# Patient Record
Sex: Male | Born: 1950 | Race: White | Hispanic: No | Marital: Married | Smoking: Never smoker
Health system: Southern US, Community
[De-identification: ages and names within clinical notes are randomized; demographics above are authoritative.]

## PROBLEM LIST (undated history)

## (undated) DIAGNOSIS — N400 Enlarged prostate without lower urinary tract symptoms: Secondary | ICD-10-CM

## (undated) DIAGNOSIS — G629 Polyneuropathy, unspecified: Secondary | ICD-10-CM

## (undated) DIAGNOSIS — M109 Gout, unspecified: Secondary | ICD-10-CM

## (undated) DIAGNOSIS — G5680 Other specified mononeuropathies of unspecified upper limb: Secondary | ICD-10-CM

## (undated) DIAGNOSIS — M1990 Unspecified osteoarthritis, unspecified site: Secondary | ICD-10-CM

## (undated) DIAGNOSIS — IMO0002 Reserved for concepts with insufficient information to code with codable children: Secondary | ICD-10-CM

## (undated) DIAGNOSIS — I509 Heart failure, unspecified: Secondary | ICD-10-CM

## (undated) DIAGNOSIS — E041 Nontoxic single thyroid nodule: Secondary | ICD-10-CM

## (undated) DIAGNOSIS — I10 Essential (primary) hypertension: Secondary | ICD-10-CM

## (undated) DIAGNOSIS — J189 Pneumonia, unspecified organism: Secondary | ICD-10-CM

## (undated) DIAGNOSIS — I4891 Unspecified atrial fibrillation: Secondary | ICD-10-CM

## (undated) DIAGNOSIS — I4892 Unspecified atrial flutter: Secondary | ICD-10-CM

## (undated) HISTORY — PX: IRRIGATION AND DEBRIDEMENT SEBACEOUS CYST: SHX5255

## (undated) HISTORY — DX: Reserved for concepts with insufficient information to code with codable children: IMO0002

## (undated) HISTORY — PX: PILONIDAL CYST DRAINAGE: SHX743

## (undated) HISTORY — DX: Unspecified atrial fibrillation: I48.91

## (undated) HISTORY — DX: Nontoxic single thyroid nodule: E04.1

## (undated) HISTORY — DX: Unspecified atrial flutter: I48.92

## (undated) HISTORY — DX: Heart failure, unspecified: I50.9

## (undated) HISTORY — PX: CATARACT EXTRACTION W/ INTRAOCULAR LENS  IMPLANT, BILATERAL: SHX1307

## (undated) HISTORY — PX: BIOPSY THYROID: PRO38

## (undated) HISTORY — DX: Benign prostatic hyperplasia without lower urinary tract symptoms: N40.0

## (undated) HISTORY — DX: Other specified mononeuropathies of unspecified upper limb: G56.80

## (undated) HISTORY — PX: BACK SURGERY: SHX140

## (undated) HISTORY — DX: Gout, unspecified: M10.9

---

## 2009-12-18 ENCOUNTER — Ambulatory Visit (HOSPITAL_COMMUNITY): Admission: RE | Admit: 2009-12-18 | Discharge: 2009-12-18 | Payer: Self-pay | Admitting: General Surgery

## 2010-05-25 HISTORY — PX: COLONOSCOPY: SHX174

## 2010-08-09 LAB — CBC
HCT: 38.6 % — ABNORMAL LOW (ref 39.0–52.0)
Hemoglobin: 13.3 g/dL (ref 13.0–17.0)
MCHC: 34.3 g/dL (ref 30.0–36.0)
RBC: 4.49 MIL/uL (ref 4.22–5.81)

## 2010-08-09 LAB — WOUND CULTURE: Culture: NO GROWTH

## 2010-08-09 LAB — BASIC METABOLIC PANEL
GFR calc Af Amer: >60 mL/min (ref >60)
GFR calc non Af Amer: >60 mL/min (ref >60)
Glucose, Bld: 110 mg/dL — ABNORMAL HIGH (ref 70–99)
Potassium: 3.5 mEq/L (ref 3.5–5.1)
Sodium: 139 mEq/L (ref 135–145)

## 2010-08-09 LAB — SURGICAL PCR SCREEN
MRSA, PCR: NEGATIVE
Staphylococcus aureus: NEGATIVE

## 2010-12-30 HISTORY — PX: COLONOSCOPY: SHX174

## 2011-09-21 ENCOUNTER — Other Ambulatory Visit: Payer: Self-pay | Admitting: Otolaryngology

## 2011-09-21 DIAGNOSIS — E041 Nontoxic single thyroid nodule: Secondary | ICD-10-CM

## 2011-09-29 ENCOUNTER — Ambulatory Visit
Admission: RE | Admit: 2011-09-29 | Discharge: 2011-09-29 | Disposition: A | Discharge status: Home / Self Care | Payer: PRIVATE HEALTH INSURANCE | Source: Ambulatory Visit | Attending: Otolaryngology | Admitting: Otolaryngology

## 2011-09-29 ENCOUNTER — Other Ambulatory Visit (HOSPITAL_COMMUNITY)
Admission: RE | Admit: 2011-09-29 | Discharge: 2011-09-29 | Disposition: A | Discharge status: Home / Self Care | Payer: PRIVATE HEALTH INSURANCE | Source: Ambulatory Visit | Attending: Interventional Radiology | Admitting: Interventional Radiology

## 2011-09-29 DIAGNOSIS — E049 Nontoxic goiter, unspecified: Secondary | ICD-10-CM | POA: Insufficient documentation

## 2011-09-29 DIAGNOSIS — E041 Nontoxic single thyroid nodule: Secondary | ICD-10-CM

## 2011-11-25 ENCOUNTER — Other Ambulatory Visit: Payer: Self-pay | Admitting: Orthopaedic Surgery

## 2011-11-25 DIAGNOSIS — M545 Low back pain: Secondary | ICD-10-CM

## 2011-11-25 DIAGNOSIS — M5137 Other intervertebral disc degeneration, lumbosacral region: Secondary | ICD-10-CM

## 2011-12-02 ENCOUNTER — Ambulatory Visit
Admission: RE | Admit: 2011-12-02 | Discharge: 2011-12-02 | Disposition: A | Discharge status: Home / Self Care | Payer: PRIVATE HEALTH INSURANCE | Source: Ambulatory Visit | Attending: Orthopaedic Surgery | Admitting: Orthopaedic Surgery

## 2011-12-02 DIAGNOSIS — M5137 Other intervertebral disc degeneration, lumbosacral region: Secondary | ICD-10-CM

## 2011-12-02 DIAGNOSIS — M545 Low back pain: Secondary | ICD-10-CM

## 2012-01-11 DIAGNOSIS — R002 Palpitations: Secondary | ICD-10-CM

## 2012-02-16 ENCOUNTER — Encounter: Payer: Self-pay | Admitting: Cardiology

## 2012-02-23 ENCOUNTER — Other Ambulatory Visit: Payer: Self-pay | Admitting: Cardiology

## 2012-02-23 ENCOUNTER — Telehealth: Payer: Self-pay

## 2012-02-23 ENCOUNTER — Ambulatory Visit (INDEPENDENT_AMBULATORY_CARE_PROVIDER_SITE_OTHER): Payer: PRIVATE HEALTH INSURANCE | Admitting: Cardiology

## 2012-02-23 ENCOUNTER — Encounter: Payer: Self-pay | Admitting: Cardiology

## 2012-02-23 ENCOUNTER — Encounter: Payer: Self-pay | Admitting: *Deleted

## 2012-02-23 VITALS — BP 158/87 | HR 60 | Ht 74.0 in | Wt 268.4 lb

## 2012-02-23 DIAGNOSIS — I429 Cardiomyopathy, unspecified: Secondary | ICD-10-CM

## 2012-02-23 DIAGNOSIS — R931 Abnormal findings on diagnostic imaging of heart and coronary circulation: Secondary | ICD-10-CM | POA: Insufficient documentation

## 2012-02-23 DIAGNOSIS — R9389 Abnormal findings on diagnostic imaging of other specified body structures: Secondary | ICD-10-CM

## 2012-02-23 DIAGNOSIS — I428 Other cardiomyopathies: Secondary | ICD-10-CM

## 2012-02-23 NOTE — Telephone Encounter (Signed)
No precert required per Ebbie Ridge @ [facility] [phone]

## 2012-02-23 NOTE — Progress Notes (Signed)
HPI The patient presents for evaluation of a cardiomyopathy. Earlier this year he was complaining of palpitations. He relates this to an attempt to stop his Bystolic in favor of another blood pressure medication. He was eventually restarted on this. However, he noticed palpitations during this transition period.  He describes skipping heartbeats. He did not describe presyncope or syncope with these. They would happen at rest and at night. He could not bring him on. He did wear a monitor which demonstrated occasional PVCs and PACs but no sustained dysrhythmia. He also had a workup which included an echocardiogram which suggested that his EF was mildly low at 45-50%. He has never had any chest pressure, neck or arm discomfort. He denies any shortness of breath, PND or orthopnea. He's had no edema. He has started exercising recently walking on a treadmill without limitations.   No Known Allergies  Current Outpatient Prescriptions  Medication Sig Dispense Refill  . allopurinol (ZYLOPRIM) 300 MG tablet Take 300 mg by mouth daily.      . Calcium Carb-Cholecalciferol (CALCIUM + D3 PO) Take 1 tablet by mouth daily.      . Cholecalciferol (VITAMIN D3) 2000 UNITS TABS Take 1 tablet by mouth daily.      [name] CINNAMON PO Take 350 mg by mouth daily.      . Coenzyme Q10 200 MG capsule Take 200 mg by mouth daily.      . Ferrous Fumarate (IRON) 18 MG TBCR Take 1 tablet by mouth daily.      . fish oil-omega-3 fatty acids 1000 MG capsule Take 830 mg by mouth daily.      . Glucosamine-Chondroitin (GLUCOSAMINE CHONDR COMPLEX PO) Take by mouth daily. 1500mg /1200mg       . Lutein 10 MG TABS Take 1 tablet by mouth daily.      . LUTEIN-ZEAXANTHIN PO Take 4 mg by mouth daily.      . Multiple Vitamins-Minerals (MULTIVITAMIN PO) Take 1 tablet by mouth daily.      . nebivolol (BYSTOLIC) 10 MG tablet Take one tab by mouth every morning & 1/2 tab every evening      . PHOSPHATIDYLCHOLINE PO Take 420 mg by mouth daily.      .  Probiotic Product (PROBIOTIC DAILY PO) Take by mouth. 5 billion unit daily      . RESVERATROL 100 MG CAPS Take 1 capsule by mouth daily.      . valsartan (DIOVAN) 320 MG tablet Take 320 mg by mouth daily.        Past Medical History  Diagnosis Date  . HTN (hypertension)   . Gout   . Chronic back pain     Spinal stenosis  . Thyroid nodule     Benign    Past Surgical History  Procedure Date  . Pilonidal cyst drainage   . Cataract extraction     Family History  Problem Relation Age of Onset  . Hypertension    . Cancer Father     Lung  . Atrial fibrillation Mother   . Cancer Brother     Liver    History   Social History  . Marital Status: Married    Spouse Name: N/A    Number of Children: 3  . Years of Education: N/A   Occupational History  . Not on file.   Social History Main Topics  . Smoking status: Never Smoker   . Smokeless tobacco: Not on file  . Alcohol Use: No  . Drug Use:  No  . Sexually Active: Not on file   Other Topics Concern  . Not on file   Social History Narrative  . No narrative on file    ROS:  As stated in the HPI and negative for all other systems.  PHYSICAL EXAM BP 158/87  Pulse 60  Ht 6\' 2"  (1.88 m)  Wt 121.745 kg (268 lb 6.4 oz)  BMI 34.46 kg/m2  SpO2 99% GENERAL:  Well appearing HEENT:  Pupils equal round and reactive, fundi not visualized, oral mucosa unremarkable NECK:  No jugular venous distention, waveform within normal limits, carotid upstroke brisk and symmetric, no bruits, no thyromegaly LYMPHATICS:  No cervical, inguinal adenopathy LUNGS:  Clear to auscultation bilaterally BACK:  No CVA tenderness CHEST:  Unremarkable HEART:  PMI not displaced or sustained,S1 and S2 within normal limits, no S3, no S4, no clicks, no rubs, no murmurs ABD:  Flat, positive bowel sounds normal in frequency in pitch, no bruits, no rebound, no guarding, no midline pulsatile mass, no hepatomegaly, no splenomegaly EXT:  2 plus pulses  throughout, no edema, no cyanosis no clubbing SKIN:  No rashes no nodules NEURO:  Cranial nerves II through XII grossly intact, motor grossly intact throughout PSYCH:  Cognitively intact, oriented to person place and time   EKG:  Sinus bradycardia, rate 53, axis within normal limits, nonspecific inferior T wave changes.  01/11/12  ASSESSMENT AND PLAN  Cardiomyopathy  - I do not strongly suspect an ischemic etiology. However, given the reduced ejection fraction this needs to be ruled out. I will schedule an exercise perfusion study. This can also reassess the ejection fraction.   Obesity - I congratulated him on a 40 pound weight loss over the past many months. We discussed exercise as well as diet continue this.  Hypertension - His blood pressure is elevated today. However, this is unusual. No change in therapy is indicated at this point.

## 2012-02-23 NOTE — Telephone Encounter (Signed)
Exercise Myoview holding bystolic  UV:[id number] 425.4 Weight 268   Thursday, February 25, 2012 [facility]

## 2012-02-23 NOTE — Patient Instructions (Addendum)
Your physician recommends that you schedule a follow-up appointment based on your stress test results. We will call you with the result. Your physician recommends that you continue on your current medications as directed. Please refer to the Current Medication list given to you today.  Your physician has requested that you have en exercise stress myoview. For further information please visit [email or web address] Please follow instruction sheet, as given.

## 2012-02-25 DIAGNOSIS — R002 Palpitations: Secondary | ICD-10-CM

## 2012-03-01 ENCOUNTER — Telehealth: Payer: Self-pay | Admitting: *Deleted

## 2012-03-01 NOTE — Telephone Encounter (Signed)
Message copied by Eustace Moore on Tue Mar 01, 2012  4:23 PM ------      Message from: Rollene Rotunda      Created: Sun Feb 28, 2012  4:57 PM       EF was low normal. Submaximal test but no evidence of ischemia.  Call Mr. Provencio with the results and send results to VYAS,DHRUV B., MD

## 2012-03-01 NOTE — Telephone Encounter (Signed)
Patient informed and copy sent to PCP. 

## 2012-03-03 ENCOUNTER — Ambulatory Visit: Payer: PRIVATE HEALTH INSURANCE | Admitting: Cardiology

## 2012-05-11 ENCOUNTER — Other Ambulatory Visit: Payer: Self-pay | Admitting: Otolaryngology

## 2012-05-11 DIAGNOSIS — D34 Benign neoplasm of thyroid gland: Secondary | ICD-10-CM

## 2012-05-27 ENCOUNTER — Ambulatory Visit
Admission: RE | Admit: 2012-05-27 | Discharge: 2012-05-27 | Disposition: A | Discharge status: Home / Self Care | Payer: PRIVATE HEALTH INSURANCE | Source: Ambulatory Visit | Attending: Otolaryngology | Admitting: Otolaryngology

## 2012-05-27 DIAGNOSIS — D34 Benign neoplasm of thyroid gland: Secondary | ICD-10-CM

## 2012-10-28 ENCOUNTER — Other Ambulatory Visit: Payer: Self-pay | Admitting: Otolaryngology

## 2012-10-28 DIAGNOSIS — D34 Benign neoplasm of thyroid gland: Secondary | ICD-10-CM

## 2012-10-31 ENCOUNTER — Ambulatory Visit
Admission: RE | Admit: 2012-10-31 | Discharge: 2012-10-31 | Disposition: A | Discharge status: Home / Self Care | Payer: PRIVATE HEALTH INSURANCE | Source: Ambulatory Visit | Attending: Otolaryngology | Admitting: Otolaryngology

## 2012-10-31 DIAGNOSIS — D34 Benign neoplasm of thyroid gland: Secondary | ICD-10-CM

## 2013-01-10 ENCOUNTER — Other Ambulatory Visit: Payer: Self-pay | Admitting: Orthopaedic Surgery

## 2013-01-10 DIAGNOSIS — M545 Low back pain: Secondary | ICD-10-CM

## 2013-01-14 ENCOUNTER — Ambulatory Visit
Admission: RE | Admit: 2013-01-14 | Discharge: 2013-01-14 | Disposition: A | Discharge status: Home / Self Care | Payer: PRIVATE HEALTH INSURANCE | Source: Ambulatory Visit | Attending: Orthopaedic Surgery | Admitting: Orthopaedic Surgery

## 2013-01-14 DIAGNOSIS — M545 Low back pain: Secondary | ICD-10-CM

## 2013-01-17 ENCOUNTER — Encounter (HOSPITAL_COMMUNITY): Payer: Self-pay | Admitting: Pharmacy Technician

## 2013-01-17 NOTE — H&P (Signed)
[name] is an 62 y.o. male.   Chief Complaint: back and leg pain. HPI:   He has had lumbar stenosis at the L4-5 level.  Pain has gotten severe in the last few weeks.  The last 5 days, he has not been able to walk.  He is using a walker that he borrowed.  He was only able to make it 50-75 feet.  He has had claudication, sometimes a little worse on the left than right, sometimes worse on the right.  He gets relief when he sits, relief in the supine position.  His distance is reproducible.  He gets some relief if he is in forward flexed position.  He has normally been followed by Dr. Sherril Croon, [facility].   He has been treated with prednisone pack.  He has had multiple epidural steroid injections in the last year.     An MRI scan from 12/02/2011 showed moderately severe stenosis, multifactorial, at the L4-5 level with annular bulge, right paracentral, left extra-foraminal disk protrusion, and moderate facet and ligamentous hypertrophy with some facet leakage of fluid, possibly small cyst subligamentous on the left side.  Mild changes at the level above or below.  No areas of compression other than the 4-5 level.    Past Medical History  Diagnosis Date  . HTN (hypertension)   . Gout   . Chronic back pain     Spinal stenosis  . Thyroid nodule     Benign  . Atrial fib/flutter, transient   . CAD (coronary artery disease)     Stent x 2  . SBO (small bowel obstruction)   . BPH (benign prostatic hyperplasia)   . COPD (chronic obstructive pulmonary disease)   . Pulmonary emboli   . Lung nodule     Past Surgical History  Procedure Laterality Date  . Pilonidal cyst drainage    . Cataract extraction      Family History  Problem Relation Age of Onset  . Hypertension    . Cancer Father     Lung  . Atrial fibrillation Mother   . Cancer Brother     Liver   Social History:  reports that he has never smoked. He does not have any smokeless tobacco history on file. He reports that  he does not drink alcohol or use illicit drugs.  Allergies: No Known Allergies  No prescriptions prior to admission    No results found for this or any previous visit (from the past 48 hour(s)). No results found.  Review of Systems  Musculoskeletal: Positive for back pain.  Neurological: Positive for tingling and focal weakness.       Leg weakness and symptoms of neurogenic claudication with ambulation  All other systems reviewed and are negative.    There were no vitals taken for this visit. Physical Exam  Constitutional: He is oriented to person, place, and time. He appears well-developed and well-nourished.  HENT:  Head: Normocephalic and atraumatic.  Eyes: EOM are normal. Pupils are equal, round, and reactive to light.  Neck: Normal range of motion. Neck supple.  Cardiovascular: Normal rate.   Respiratory: Effort normal.  GI: Soft.  Musculoskeletal:  Negative SLR bilateral.  No focal weakness of LEs  Neurological: He is alert and oriented to person, place, and time.  Skin: Skin is warm and dry.  Psychiatric: He has a normal mood and affect.     Assessment/Plan Spinal stenosis L4-5  PLAN:  L4-5 central decompressive laminectomy  [name]  01/17/2013, 11:49 AM

## 2013-01-18 ENCOUNTER — Other Ambulatory Visit (HOSPITAL_COMMUNITY): Payer: Self-pay | Admitting: Orthopaedic Surgery

## 2013-01-19 ENCOUNTER — Encounter (HOSPITAL_COMMUNITY)
Admission: RE | Admit: 2013-01-19 | Discharge: 2013-01-19 | Disposition: A | Discharge status: Home / Self Care | Payer: PRIVATE HEALTH INSURANCE | Source: Ambulatory Visit | Attending: Orthopaedic Surgery | Admitting: Orthopaedic Surgery

## 2013-01-19 ENCOUNTER — Encounter (HOSPITAL_COMMUNITY): Payer: Self-pay

## 2013-01-19 HISTORY — DX: Unspecified osteoarthritis, unspecified site: M19.90

## 2013-01-19 LAB — COMPREHENSIVE METABOLIC PANEL
Albumin: 3.7 g/dL (ref 3.5–5.2)
BUN: 17 mg/dL (ref 6–23)
Chloride: 106 mEq/L (ref 96–112)
Creatinine, Ser: 0.92 mg/dL (ref 0.50–1.35)
GFR calc non Af Amer: 88 mL/min — ABNORMAL LOW (ref >90)
Total Bilirubin: 1.1 mg/dL (ref 0.3–1.2)

## 2013-01-19 LAB — CBC
HCT: 42.8 % (ref 39.0–52.0)
MCV: 88.6 fL (ref 78.0–100.0)
RDW: 13.5 % (ref 11.5–15.5)
WBC: 8.3 10*3/uL (ref 4.0–10.5)

## 2013-01-19 LAB — PROTIME-INR
INR: 1.13 (ref 0.00–1.49)
Prothrombin Time: 14.3 seconds (ref 11.6–15.2)

## 2013-01-19 MED ORDER — DEXTROSE 5 % IV SOLN
3.0000 g | INTRAVENOUS | Status: AC
  Administered 2013-01-20: 3 g via INTRAVENOUS
  Filled 2013-01-19 (×2): qty 3000

## 2013-01-19 NOTE — Pre-Procedure Instructions (Signed)
[name]  01/19/2013    Your procedure is scheduled on:  January 20, 2013 at 11:24 AM  Report to [facility] at 9:30 AM.  Call this number if you have problems the morning of surgery: [phone]   Remember:   Do not eat food or drink liquids after midnight.   Take these medicines the morning of surgery with A SIP OF WATER: amLODipine (NORVASC), nebivolol (BYSTOLIC)     Do not wear jewelry, make-up or nail polish.  Do not wear lotions, powders, or perfumes. You may wear deodorant.  Do not shave 48 hours prior to surgery. Men may shave face and neck.  Do not bring valuables to the hospital.  [facility] is not responsible                   for any belongings or valuables.  Contacts, dentures or bridgework may not be worn into surgery.  Leave suitcase in the car. After surgery it may be brought to your room.  For patients admitted to the hospital, checkout time is 11:00 AM the day of  discharge.    Special Instructions: Shower using CHG 2 nights before surgery and the night before surgery.  If you shower the day of surgery use CHG.  Use special wash - you have one bottle of CHG for all showers.  You should use approximately 1/3 of the bottle for each shower.   Please read over the following fact sheets that you were given: Pain Booklet, Coughing and Deep Breathing, MRSA Information and Surgical Site Infection Prevention

## 2013-01-19 NOTE — Progress Notes (Signed)
01/19/13 0951  OBSTRUCTIVE SLEEP APNEA  Have you ever been diagnosed with sleep apnea through a sleep study? No  Do you snore loudly (loud enough to be heard through closed doors)?  0  Do you often feel tired, fatigued, or sleepy during the daytime? 0  Has anyone observed you stop breathing during your sleep? 0  Do you have, or are you being treated for high blood pressure? 1  BMI more than 35 kg/m2? 0  Age over 64 years old? 1  Neck circumference greater than 40 cm/18 inches? 1  Gender: 1  Obstructive Sleep Apnea Score 4  Score 4 or greater  Results sent to PCP

## 2013-01-20 ENCOUNTER — Encounter (HOSPITAL_COMMUNITY): Payer: Self-pay | Admitting: Anesthesiology

## 2013-01-20 ENCOUNTER — Encounter (HOSPITAL_COMMUNITY): Payer: Self-pay | Admitting: *Deleted

## 2013-01-20 ENCOUNTER — Encounter (HOSPITAL_COMMUNITY)
Admission: RE | Disposition: A | Discharge status: Home / Self Care | Payer: Self-pay | Source: Ambulatory Visit | Attending: Orthopaedic Surgery

## 2013-01-20 ENCOUNTER — Ambulatory Visit (HOSPITAL_COMMUNITY): Payer: PRIVATE HEALTH INSURANCE

## 2013-01-20 ENCOUNTER — Ambulatory Visit (HOSPITAL_COMMUNITY): Payer: PRIVATE HEALTH INSURANCE | Admitting: Anesthesiology

## 2013-01-20 ENCOUNTER — Observation Stay (HOSPITAL_COMMUNITY)
Admission: RE | Admit: 2013-01-20 | Discharge: 2013-01-21 | Disposition: A | Discharge status: Home / Self Care | Payer: PRIVATE HEALTH INSURANCE | Source: Ambulatory Visit | Attending: Orthopaedic Surgery | Admitting: Orthopaedic Surgery

## 2013-01-20 DIAGNOSIS — M5126 Other intervertebral disc displacement, lumbar region: Principal | ICD-10-CM | POA: Diagnosis present

## 2013-01-20 DIAGNOSIS — N400 Enlarged prostate without lower urinary tract symptoms: Secondary | ICD-10-CM | POA: Insufficient documentation

## 2013-01-20 DIAGNOSIS — Z01818 Encounter for other preprocedural examination: Secondary | ICD-10-CM | POA: Insufficient documentation

## 2013-01-20 DIAGNOSIS — R911 Solitary pulmonary nodule: Secondary | ICD-10-CM | POA: Insufficient documentation

## 2013-01-20 DIAGNOSIS — Z86711 Personal history of pulmonary embolism: Secondary | ICD-10-CM | POA: Insufficient documentation

## 2013-01-20 DIAGNOSIS — Z01812 Encounter for preprocedural laboratory examination: Secondary | ICD-10-CM | POA: Insufficient documentation

## 2013-01-20 DIAGNOSIS — J449 Chronic obstructive pulmonary disease, unspecified: Secondary | ICD-10-CM | POA: Insufficient documentation

## 2013-01-20 DIAGNOSIS — I10 Essential (primary) hypertension: Secondary | ICD-10-CM | POA: Insufficient documentation

## 2013-01-20 DIAGNOSIS — I2510 Atherosclerotic heart disease of native coronary artery without angina pectoris: Secondary | ICD-10-CM | POA: Insufficient documentation

## 2013-01-20 DIAGNOSIS — E041 Nontoxic single thyroid nodule: Secondary | ICD-10-CM | POA: Insufficient documentation

## 2013-01-20 DIAGNOSIS — J4489 Other specified chronic obstructive pulmonary disease: Secondary | ICD-10-CM | POA: Insufficient documentation

## 2013-01-20 DIAGNOSIS — G8929 Other chronic pain: Secondary | ICD-10-CM | POA: Insufficient documentation

## 2013-01-20 DIAGNOSIS — M109 Gout, unspecified: Secondary | ICD-10-CM | POA: Insufficient documentation

## 2013-01-20 DIAGNOSIS — Z9861 Coronary angioplasty status: Secondary | ICD-10-CM | POA: Insufficient documentation

## 2013-01-20 HISTORY — PX: LUMBAR LAMINECTOMY: SHX95

## 2013-01-20 SURGERY — MICRODISCECTOMY LUMBAR LAMINECTOMY
Anesthesia: General | Site: Back | Wound class: Clean

## 2013-01-20 MED ORDER — SODIUM CHLORIDE 0.9 % IJ SOLN
3.0000 mL | Freq: Two times a day (BID) | INTRAMUSCULAR | Status: DC
  Administered 2013-01-20 – 2013-01-21 (×2): 3 mL via INTRAVENOUS

## 2013-01-20 MED ORDER — ROCURONIUM BROMIDE 100 MG/10ML IV SOLN
INTRAVENOUS | Status: DC | PRN
  Administered 2013-01-20: 10 mg via INTRAVENOUS
  Administered 2013-01-20: 50 mg via INTRAVENOUS

## 2013-01-20 MED ORDER — OXYCODONE HCL 5 MG PO TABS
ORAL_TABLET | ORAL | Status: AC
  Filled 2013-01-20: qty 1

## 2013-01-20 MED ORDER — FENTANYL CITRATE 0.05 MG/ML IJ SOLN
INTRAMUSCULAR | Status: DC | PRN
  Administered 2013-01-20: 50 ug via INTRAVENOUS
  Administered 2013-01-20 (×2): 100 ug via INTRAVENOUS

## 2013-01-20 MED ORDER — METHOCARBAMOL 100 MG/ML IJ SOLN
500.0000 mg | Freq: Four times a day (QID) | INTRAVENOUS | Status: DC | PRN
  Filled 2013-01-20: qty 5

## 2013-01-20 MED ORDER — SODIUM CHLORIDE 0.9 % IV SOLN: 250.0000 mL | INTRAVENOUS | Status: DC

## 2013-01-20 MED ORDER — MORPHINE SULFATE 2 MG/ML IJ SOLN: 1.0000 mg | INTRAMUSCULAR | Status: DC | PRN

## 2013-01-20 MED ORDER — FLEET ENEMA 7-19 GM/118ML RE ENEM: 1.0000 | ENEMA | Freq: Once | RECTAL | Status: AC | PRN

## 2013-01-20 MED ORDER — ZOLPIDEM TARTRATE 5 MG PO TABS: 5.0000 mg | ORAL_TABLET | Freq: Every evening | ORAL | Status: DC | PRN

## 2013-01-20 MED ORDER — OXYCODONE HCL 5 MG/5ML PO SOLN: 5.0000 mg | Freq: Once | ORAL | Status: AC | PRN

## 2013-01-20 MED ORDER — PROMETHAZINE HCL 25 MG/ML IJ SOLN: 6.2500 mg | INTRAMUSCULAR | Status: DC | PRN

## 2013-01-20 MED ORDER — OXYCODONE-ACETAMINOPHEN 5-325 MG PO TABS: 1.0000 | ORAL_TABLET | ORAL | Status: DC | PRN

## 2013-01-20 MED ORDER — DOCUSATE SODIUM 100 MG PO CAPS
100.0000 mg | ORAL_CAPSULE | Freq: Two times a day (BID) | ORAL | Status: DC
  Administered 2013-01-21: 100 mg via ORAL
  Filled 2013-01-20 (×2): qty 1

## 2013-01-20 MED ORDER — BISACODYL 10 MG RE SUPP: 10.0000 mg | Freq: Every day | RECTAL | Status: DC | PRN

## 2013-01-20 MED ORDER — HYDROMORPHONE HCL PF 1 MG/ML IJ SOLN
INTRAMUSCULAR | Status: AC
  Filled 2013-01-20: qty 1

## 2013-01-20 MED ORDER — THROMBIN 20000 UNITS EX SOLR
CUTANEOUS | Status: AC
  Filled 2013-01-20: qty 20000

## 2013-01-20 MED ORDER — ONDANSETRON HCL 4 MG/2ML IJ SOLN
INTRAMUSCULAR | Status: DC | PRN
  Administered 2013-01-20: 4 mg via INTRAVENOUS

## 2013-01-20 MED ORDER — NEBIVOLOL HCL 10 MG PO TABS
10.0000 mg | ORAL_TABLET | Freq: Two times a day (BID) | ORAL | Status: DC
  Administered 2013-01-21: 10 mg via ORAL
  Filled 2013-01-20 (×3): qty 1

## 2013-01-20 MED ORDER — SODIUM CHLORIDE 0.9 % IJ SOLN: 3.0000 mL | INTRAMUSCULAR | Status: DC | PRN

## 2013-01-20 MED ORDER — HYDROMORPHONE HCL PF 1 MG/ML IJ SOLN: 0.2500 mg | INTRAMUSCULAR | Status: DC | PRN

## 2013-01-20 MED ORDER — OXYCODONE HCL 5 MG PO TABS
5.0000 mg | ORAL_TABLET | Freq: Once | ORAL | Status: AC | PRN
  Administered 2013-01-20: 5 mg via ORAL

## 2013-01-20 MED ORDER — LACTATED RINGERS IV SOLN
INTRAVENOUS | Status: DC | PRN
  Administered 2013-01-20 (×2): via INTRAVENOUS

## 2013-01-20 MED ORDER — PHENYLEPHRINE HCL 10 MG/ML IJ SOLN
INTRAMUSCULAR | Status: DC | PRN
  Administered 2013-01-20: 40 ug via INTRAVENOUS

## 2013-01-20 MED ORDER — HYDROCODONE-ACETAMINOPHEN 5-325 MG PO TABS: 1.0000 | ORAL_TABLET | ORAL | Status: DC | PRN

## 2013-01-20 MED ORDER — CEFAZOLIN SODIUM 1-5 GM-% IV SOLN
1.0000 g | Freq: Three times a day (TID) | INTRAVENOUS | Status: AC
  Administered 2013-01-20 – 2013-01-21 (×2): 1 g via INTRAVENOUS
  Filled 2013-01-20 (×2): qty 50

## 2013-01-20 MED ORDER — MIDAZOLAM HCL 2 MG/2ML IJ SOLN: 1.0000 mg | INTRAMUSCULAR | Status: DC | PRN

## 2013-01-20 MED ORDER — OXYCODONE-ACETAMINOPHEN 5-325 MG PO TABS
1.0000 | ORAL_TABLET | ORAL | Status: DC | PRN
  Administered 2013-01-20: 2 via ORAL
  Filled 2013-01-20: qty 2

## 2013-01-20 MED ORDER — EPHEDRINE SULFATE 50 MG/ML IJ SOLN
INTRAMUSCULAR | Status: DC | PRN
  Administered 2013-01-20 (×2): 10 mg via INTRAVENOUS
  Administered 2013-01-20: 5 mg via INTRAVENOUS
  Administered 2013-01-20 (×2): 10 mg via INTRAVENOUS

## 2013-01-20 MED ORDER — LIDOCAINE HCL (CARDIAC) 20 MG/ML IV SOLN
INTRAVENOUS | Status: DC | PRN
  Administered 2013-01-20: 100 mg via INTRAVENOUS

## 2013-01-20 MED ORDER — NEOSTIGMINE METHYLSULFATE 1 MG/ML IJ SOLN
INTRAMUSCULAR | Status: DC | PRN
  Administered 2013-01-20: 4 mg via INTRAVENOUS

## 2013-01-20 MED ORDER — BUPIVACAINE HCL 0.5 % IJ SOLN
INTRAMUSCULAR | Status: DC | PRN
  Administered 2013-01-20: 10 mL

## 2013-01-20 MED ORDER — METHOCARBAMOL 500 MG PO TABS: 500.0000 mg | ORAL_TABLET | Freq: Four times a day (QID) | ORAL | Status: DC | PRN

## 2013-01-20 MED ORDER — ACETAMINOPHEN 325 MG PO TABS: 650.0000 mg | ORAL_TABLET | ORAL | Status: DC | PRN

## 2013-01-20 MED ORDER — MENTHOL 3 MG MT LOZG: 1.0000 | LOZENGE | OROMUCOSAL | Status: DC | PRN

## 2013-01-20 MED ORDER — METHOCARBAMOL 500 MG PO TABS
ORAL_TABLET | ORAL | Status: AC
  Administered 2013-01-20: 500 mg
  Filled 2013-01-20: qty 1

## 2013-01-20 MED ORDER — GLYCOPYRROLATE 0.2 MG/ML IJ SOLN
INTRAMUSCULAR | Status: DC | PRN
  Administered 2013-01-20: 0.6 mg via INTRAVENOUS

## 2013-01-20 MED ORDER — PANTOPRAZOLE SODIUM 40 MG IV SOLR
40.0000 mg | Freq: Every day | INTRAVENOUS | Status: DC
  Administered 2013-01-20: 40 mg via INTRAVENOUS
  Filled 2013-01-20 (×2): qty 40

## 2013-01-20 MED ORDER — PHENOL 1.4 % MT LIQD: 1.0000 | OROMUCOSAL | Status: DC | PRN

## 2013-01-20 MED ORDER — IRBESARTAN 300 MG PO TABS
300.0000 mg | ORAL_TABLET | Freq: Every day | ORAL | Status: DC
  Administered 2013-01-21: 300 mg via ORAL
  Filled 2013-01-20: qty 1

## 2013-01-20 MED ORDER — ACETAMINOPHEN 650 MG RE SUPP: 650.0000 mg | RECTAL | Status: DC | PRN

## 2013-01-20 MED ORDER — BUPIVACAINE HCL (PF) 0.5 % IJ SOLN
INTRAMUSCULAR | Status: AC
  Filled 2013-01-20: qty 30

## 2013-01-20 MED ORDER — FENTANYL CITRATE 0.05 MG/ML IJ SOLN: 50.0000 ug | Freq: Once | INTRAMUSCULAR | Status: DC

## 2013-01-20 MED ORDER — METHOCARBAMOL 500 MG PO TABS
500.0000 mg | ORAL_TABLET | Freq: Four times a day (QID) | ORAL | Status: DC | PRN
  Administered 2013-01-20: 500 mg via ORAL
  Filled 2013-01-20: qty 1

## 2013-01-20 MED ORDER — MIDAZOLAM HCL 5 MG/5ML IJ SOLN
INTRAMUSCULAR | Status: DC | PRN
  Administered 2013-01-20: 2 mg via INTRAVENOUS

## 2013-01-20 MED ORDER — 0.9 % SODIUM CHLORIDE (POUR BTL) OPTIME
TOPICAL | Status: DC | PRN
  Administered 2013-01-20: 1000 mL

## 2013-01-20 MED ORDER — ALLOPURINOL 300 MG PO TABS
300.0000 mg | ORAL_TABLET | Freq: Every day | ORAL | Status: DC
  Administered 2013-01-21: 300 mg via ORAL
  Filled 2013-01-20: qty 1

## 2013-01-20 MED ORDER — ARTIFICIAL TEARS OP OINT
TOPICAL_OINTMENT | OPHTHALMIC | Status: DC | PRN
  Administered 2013-01-20: 1 via OPHTHALMIC

## 2013-01-20 MED ORDER — SENNOSIDES-DOCUSATE SODIUM 8.6-50 MG PO TABS: 1.0000 | ORAL_TABLET | Freq: Every evening | ORAL | Status: DC | PRN

## 2013-01-20 MED ORDER — KETOROLAC TROMETHAMINE 30 MG/ML IJ SOLN
30.0000 mg | Freq: Four times a day (QID) | INTRAMUSCULAR | Status: DC
  Administered 2013-01-20 – 2013-01-21 (×3): 30 mg via INTRAVENOUS
  Filled 2013-01-20 (×3): qty 1

## 2013-01-20 MED ORDER — ONDANSETRON HCL 4 MG/2ML IJ SOLN: 4.0000 mg | INTRAMUSCULAR | Status: DC | PRN

## 2013-01-20 MED ORDER — AMLODIPINE BESYLATE 5 MG PO TABS
5.0000 mg | ORAL_TABLET | Freq: Every day | ORAL | Status: DC
  Administered 2013-01-21: 5 mg via ORAL
  Filled 2013-01-20: qty 1

## 2013-01-20 MED ORDER — KCL IN DEXTROSE-NACL 20-5-0.45 MEQ/L-%-% IV SOLN
INTRAVENOUS | Status: DC
  Administered 2013-01-20: 17:00:00 via INTRAVENOUS
  Filled 2013-01-20 (×3): qty 1000

## 2013-01-20 MED ORDER — PROPOFOL 10 MG/ML IV BOLUS
INTRAVENOUS | Status: DC | PRN
  Administered 2013-01-20: 200 mg via INTRAVENOUS
  Administered 2013-01-20: 50 mg via INTRAVENOUS

## 2013-01-20 MED ORDER — LACTATED RINGERS IV SOLN
INTRAVENOUS | Status: DC
  Administered 2013-01-20: 10:00:00 via INTRAVENOUS

## 2013-01-20 SURGICAL SUPPLY — 52 items
BUR ROUND FLUTED 4 SOFT TCH (BURR) ×2 IMPLANT
CLOTH BEACON ORANGE TIMEOUT ST (SAFETY) ×2 IMPLANT
CORDS BIPOLAR (ELECTRODE) ×2 IMPLANT
COVER SURGICAL LIGHT HANDLE (MISCELLANEOUS) ×2 IMPLANT
DERMABOND ADVANCED (GAUZE/BANDAGES/DRESSINGS) ×1
DERMABOND ADVANCED .7 DNX12 (GAUZE/BANDAGES/DRESSINGS) ×1 IMPLANT
DRAPE MICROSCOPE LEICA (MISCELLANEOUS) ×2 IMPLANT
DRAPE PROXIMA HALF (DRAPES) ×4 IMPLANT
DRSG EMULSION OIL 3X3 NADH (GAUZE/BANDAGES/DRESSINGS) ×2 IMPLANT
DRSG MEPILEX BORDER 4X4 (GAUZE/BANDAGES/DRESSINGS) ×2 IMPLANT
DRSG MEPILEX BORDER 4X8 (GAUZE/BANDAGES/DRESSINGS) ×2 IMPLANT
DURAPREP 26ML APPLICATOR (WOUND CARE) ×2 IMPLANT
ELECT BLADE 4.0 EZ CLEAN MEGAD (MISCELLANEOUS) ×2
ELECT REM PT RETURN 9FT ADLT (ELECTROSURGICAL) ×2
ELECTRODE BLDE 4.0 EZ CLN MEGD (MISCELLANEOUS) ×1 IMPLANT
ELECTRODE REM PT RTRN 9FT ADLT (ELECTROSURGICAL) ×1 IMPLANT
GLOVE BIOGEL PI IND STRL 7.5 (GLOVE) ×1 IMPLANT
GLOVE BIOGEL PI IND STRL 8 (GLOVE) ×1 IMPLANT
GLOVE BIOGEL PI INDICATOR 7.5 (GLOVE) ×1
GLOVE BIOGEL PI INDICATOR 8 (GLOVE) ×1
GLOVE ECLIPSE 7.0 STRL STRAW (GLOVE) ×2 IMPLANT
GLOVE ORTHO TXT STRL SZ7.5 (GLOVE) ×2 IMPLANT
GOWN PREVENTION PLUS LG XLONG (DISPOSABLE) ×4 IMPLANT
GOWN STRL NON-REIN LRG LVL3 (GOWN DISPOSABLE) ×2 IMPLANT
KIT BASIN OR (CUSTOM PROCEDURE TRAY) ×2 IMPLANT
KIT ROOM TURNOVER OR (KITS) ×2 IMPLANT
MANIFOLD NEPTUNE II (INSTRUMENTS) IMPLANT
NDL SUT .5 MAYO 1.404X.05X (NEEDLE) IMPLANT
NEEDLE 22X1 1/2 (OR ONLY) (NEEDLE) ×2 IMPLANT
NEEDLE MAYO TAPER (NEEDLE)
NEEDLE SPNL 18GX3.5 QUINCKE PK (NEEDLE) ×2 IMPLANT
NS IRRIG 1000ML POUR BTL (IV SOLUTION) ×2 IMPLANT
PACK LAMINECTOMY ORTHO (CUSTOM PROCEDURE TRAY) ×2 IMPLANT
PAD ARMBOARD 7.5X6 YLW CONV (MISCELLANEOUS) ×4 IMPLANT
PATTIES SURGICAL .5 X.5 (GAUZE/BANDAGES/DRESSINGS) ×2 IMPLANT
PATTIES SURGICAL .75X.75 (GAUZE/BANDAGES/DRESSINGS) ×2 IMPLANT
SPONGE GAUZE 4X4 12PLY (GAUZE/BANDAGES/DRESSINGS) ×2 IMPLANT
SUT VIC AB 0 CT1 27 (SUTURE) ×1
SUT VIC AB 0 CT1 27XBRD ANBCTR (SUTURE) ×1 IMPLANT
SUT VIC AB 1 CT1 27 (SUTURE) ×1
SUT VIC AB 1 CT1 27XBRD ANBCTR (SUTURE) ×1 IMPLANT
SUT VIC AB 2-0 CT1 27 (SUTURE) ×1
SUT VIC AB 2-0 CT1 TAPERPNT 27 (SUTURE) ×1 IMPLANT
SUT VICRYL 0 TIES 12 18 (SUTURE) ×2 IMPLANT
SUT VICRYL 4-0 PS2 18IN ABS (SUTURE) IMPLANT
SUT VICRYL AB 2 0 TIES (SUTURE) ×2 IMPLANT
SYR 20ML ECCENTRIC (SYRINGE) IMPLANT
SYR CONTROL 10ML LL (SYRINGE) ×2 IMPLANT
TOWEL OR 17X24 6PK STRL BLUE (TOWEL DISPOSABLE) ×2 IMPLANT
TOWEL OR 17X26 10 PK STRL BLUE (TOWEL DISPOSABLE) ×2 IMPLANT
WATER STERILE IRR 1000ML POUR (IV SOLUTION) IMPLANT
YANKAUER SUCT BULB TIP NO VENT (SUCTIONS) ×2 IMPLANT

## 2013-01-20 NOTE — Anesthesia Postprocedure Evaluation (Signed)
  Anesthesia Post-op Note  Patient: [name]  Procedure(s) Performed: Procedure(s) with comments: MICRODISCECTOMY LUMBAR LAMINECTOMY (N/A) - L4-5 Decompression  Patient Location: PACU  Anesthesia Type:General  Level of Consciousness: awake and alert   Airway and Oxygen Therapy: Patient Spontanous Breathing  Post-op Pain: mild  Post-op Assessment: Post-op Vital signs reviewed, Patient's Cardiovascular Status Stable, Respiratory Function Stable, Patent Airway, No signs of Nausea or vomiting and Pain level controlled  Post-op Vital Signs: stable  Complications: No apparent anesthesia complications

## 2013-01-20 NOTE — Interval H&P Note (Signed)
History and Physical Interval Note:  01/20/2013 11:36 AM  [name]  has presented today for surgery, with the diagnosis of L4-5 Stenosis  The various methods of treatment have been discussed with the patient and family. After consideration of risks, benefits and other options for treatment, the patient has consented to  Procedure(s) with comments: MICRODISCECTOMY LUMBAR LAMINECTOMY (N/A) - L4-5 Decompression as a surgical intervention .  The patient's history has been reviewed, patient examined, no change in status, stable for surgery.  I have reviewed the patient's chart and labs.  Questions were answered to the patient's satisfaction.     Sohail Capraro C

## 2013-01-20 NOTE — Brief Op Note (Cosign Needed)
01/20/2013  2:04 PM  PATIENT:  [name]  62 y.o. male  PRE-OPERATIVE DIAGNOSIS:  L4-5 Stenosis  POST-OPERATIVE DIAGNOSIS:  L4-5 Stenosis  PROCEDURE:  Procedure(s) with comments: MICRODISCECTOMY LUMBAR LAMINECTOMY (N/A) - L4-5 Decompression  SURGEON:  Surgeon(s) and Role:    * Eldred Manges, MD - Primary  PHYSICIAN ASSISTANT: Maud Deed PAC  ASSISTANTS: none   ANESTHESIA:   general  EBL:  Total I/O In: 1500 [I.V.:1500] Out: -   BLOOD ADMINISTERED:none  DRAINS: none   LOCAL MEDICATIONS USED:  MARCAINE     SPECIMEN:  No Specimen  DISPOSITION OF SPECIMEN:  N/A  COUNTS:  YES  TOURNIQUET:  * No tourniquets in log *  DICTATION: .Note written in EPIC  PLAN OF CARE: Admit for overnight observation  PATIENT DISPOSITION:  PACU - hemodynamically stable.   Delay start of Pharmacological VTE agent (>24hrs) due to surgical blood loss or risk of bleeding: yes

## 2013-01-20 NOTE — Anesthesia Preprocedure Evaluation (Addendum)
Anesthesia Evaluation  Patient identified by MRN, date of birth, ID band Patient awake    Reviewed: Allergy & Precautions, H&P , NPO status , Patient's Chart, lab work & pertinent test results  Airway Mallampati: II TM Distance: >3 FB Neck ROM: Full    Dental  (+) Teeth Intact, Caps and Dental Advisory Given   Pulmonary  breath sounds clear to auscultation        Cardiovascular hypertension, + CAD + dysrhythmias Atrial Fibrillation Rhythm:Regular Rate:Normal     Neuro/Psych  Neuromuscular disease    GI/Hepatic   Endo/Other    Renal/GU      Musculoskeletal   Abdominal (+) + obese,   Peds  Hematology   Anesthesia Other Findings   Reproductive/Obstetrics                          Anesthesia Physical Anesthesia Plan  ASA: III  Anesthesia Plan: General   Post-op Pain Management:    Induction: Intravenous  Airway Management Planned: Oral ETT  Additional Equipment:   Intra-op Plan:   Post-operative Plan: Extubation in OR  Informed Consent: I have reviewed the patients History and Physical, chart, labs and discussed the procedure including the risks, benefits and alternatives for the proposed anesthesia with the patient or authorized representative who has indicated his/her understanding and acceptance.     Plan Discussed with: CRNA and Surgeon  Anesthesia Plan Comments:         Anesthesia Quick Evaluation

## 2013-01-20 NOTE — Transfer of Care (Signed)
Immediate Anesthesia Transfer of Care Note  Patient: [name]  Procedure(s) Performed: Procedure(s) with comments: MICRODISCECTOMY LUMBAR LAMINECTOMY (N/A) - L4-5 Decompression  Patient Location: PACU  Anesthesia Type:General  Level of Consciousness: awake, alert  and oriented  Airway & Oxygen Therapy: Patient Spontanous Breathing and Patient connected to face mask oxygen  Post-op Assessment: Report given to PACU RN, Post -op Vital signs reviewed and stable, Patient moving all extremities and Patient moving all extremities X 4  Post vital signs: Reviewed and stable  Complications: No apparent anesthesia complications

## 2013-01-20 NOTE — Anesthesia Procedure Notes (Signed)
Procedure Name: Intubation Date/Time: 01/20/2013 12:26 PM Performed by: Luster Landsberg Pre-anesthesia Checklist: Patient identified, Emergency Drugs available, Suction available and Patient being monitored Patient Re-evaluated:Patient Re-evaluated prior to inductionOxygen Delivery Method: Circle system utilized Preoxygenation: Pre-oxygenation with 100% oxygen Intubation Type: IV induction Ventilation: Mask ventilation without difficulty and Oral airway inserted - appropriate to patient size Laryngoscope Size: Miller and 3 Grade View: Grade II Tube type: Oral Tube size: 8.0 mm Number of attempts: 1 Airway Equipment and Method: Stylet Placement Confirmation: ETT inserted through vocal cords under direct vision,  positive ETCO2 and breath sounds checked- equal and bilateral Secured at: 22 cm Tube secured with: Tape Dental Injury: Teeth and Oropharynx as per pre-operative assessment  Comments: DVL by Deanna Artis CRNA

## 2013-01-20 NOTE — Progress Notes (Signed)
Patient ID: [name], male   DOB: 11/12/1950, 62 y.o.   MRN: [id number] Plan for discharge tomorrow if pt does well.  RX and instructions on chart.  OV 2 weeks.  Dressing change prior to discharge.

## 2013-01-21 NOTE — Progress Notes (Signed)
UR Completed.  [name] Jane [phone] 01/21/2013  

## 2013-01-21 NOTE — Progress Notes (Signed)
Orthopedic Progress Note   LOS: 1 day   Surgery: Procedure(s): MICRODISCECTOMY LUMBAR LAMINECTOMY  Primary surgeon: Eldred Manges, MD  POD: 1 Day Post-Op  Subjective: 24 Hour Events:  none  Interval History:  No acute events, patient recovering well  Objective: Vital signs in last 24 hours: Temp:  [96.7 F (35.9 C)-98.7 F (37.1 C)] 98.7 F (37.1 C) (08/30 0542) Pulse Rate:  [59-73] 73 (08/30 0542) Resp:  [6-18] 16 (08/30 0542) BP: (106-149)/(47-62) 111/51 mmHg (08/30 0542) SpO2:  [92 %-100 %] 97 % (08/30 0542) Weight:  [129.366 kg (285 lb 3.2 oz)] 129.366 kg (285 lb 3.2 oz) (08/29 2154)  Physical Exam: Dressing c/d/i BLE symptoms improved Toes wiggling Feet wwp. BLE NVI  Intake/Output last 3 shifts: I/O last 3 completed shifts: In: 2785 [I.V.:2685; IV Piggyback:100] Out: -    Intake/Output Summary (Last 24 hours) at 01/21/13 0900 Last data filed at 01/21/13 0800  Gross per 24 hour  Intake   3265 ml  Output      0 ml  Net   3265 ml    Labs: No results found for this or any previous visit (from the past 24 hour(s)).  Other pertinent lab/culture results:   Scheduled Meds:  . allopurinol  300 mg Oral Daily  . amLODipine  5 mg Oral Daily  . docusate sodium  100 mg Oral BID  . irbesartan  300 mg Oral Daily  . ketorolac  30 mg Intravenous Q6H  . nebivolol  10 mg Oral BID  . pantoprazole (PROTONIX) IV  40 mg Intravenous QHS  . sodium chloride  3 mL Intravenous Q12H    Continuous Infusions:  . sodium chloride    . dextrose 5 % and 0.45 % NaCl with KCl 20 mEq/L 75 mL/hr at 01/20/13 1711    PRN Meds: acetaminophen, acetaminophen, bisacodyl, HYDROcodone-acetaminophen, menthol-cetylpyridinium, methocarbamol (ROBAXIN) IV, methocarbamol, morphine injection, ondansetron (ZOFRAN) IV, oxyCODONE-acetaminophen, phenol, senna-docusate, sodium chloride, zolpidem  Antibiotics: see above  DVT prophylaxis: not indicated  Assessment/Plan: Principal Problem:  HNP (herniated nucleus pulposus), lumbar   Neurological:   Cardiovascular:   Pulmonary:   Gastro-Intestinal:   Hematology:   Infectious Disease:   Endocrine:   Renal:   F/E/N:   Right Upper Extremity:   Left Upper Extremity:   Right Lower Extremity:   Left Lower Extremity:   Spine: Dressing change today  Disposition: Plan to d/c home today. F/u as scheduled with Dr. Ophelia Charter.  Other:    Cheral Almas, MD

## 2013-01-21 NOTE — Discharge Summary (Signed)
  Orthopaedic Discharge Summary  Patient ID: [name] [id number] 61 y.o. July 06, 1950  Admit date: 01/20/2013 Admitting Physician: Eldred Manges, MD  Discharge date: 01/21/2013  Discharge Physician: Roda Shutters Discharge Diagnoses:  same  Procedures: Procedure(s): MICRODISCECTOMY LUMBAR LAMINECTOMY  Admission Condition: Good Discharged Condition: Good  Hospital Course:    [name] was taken to the operating room and underwent the above stated surgical procedure without complications. The patient recovered in the PACU and was then transferred to the post-op Orthopaedic floor for recovery, pain control, physical therapy, and discharge planning. For details of the operative procedure, please refer to the operative note.  The patient's pain was initially managed with po pain medications and IV pain medications for breakthrough pain. By the time of discharge, the patient was voicing adequate pain control with a po pain medication regimen.   The patient was given SCDs for the prevention of DVTs, along with early ambulation, and sequential compression devices.  The patient has a dry dressing in place to the surgical incision.  Daily physical and Occupational therapy were initiated and worked with the patient towards discharge goals with clearance from PT for discharge home by the time of discharge.   At the time of discharge, the patient was afebrile, vital signs are stable and the patient is in no acute distress. Compartments are soft. Peripheral pulses are 2+ bilaterally. The patient is neurovascularly intact bilaterally. The patient is medically stable and safe for discharge.   Discharge Exam: NAD Dressing c/d/i  Instructions & Disposition: Please see After Visit Summary (AVS), which was also given to the patient, for full patient instructions, disposition, and follow-up.  Cheral Almas, MD

## 2013-01-21 NOTE — Op Note (Signed)
[name], [name] NO.:  [id number]  MEDICAL RECORD NO.:  [id number]  LOCATION:  [id number]                        FACILITY:  [facility]  PHYSICIAN:  Chaos Carlile C. Ophelia Charter, M.D.    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  PROCEDURE:  L4-5 decompression and removal of large free fragment.  SURGEON:  Kaylanni Ezelle C. Ophelia Charter, MD  ASSISTANT:  Maud Deed, PA-C, medically necessary and present for the entire procedure.  ESTIMATED BLOOD LOSS:  Minimal.  INDICATIONS:  This patient has had done neurogenic claudication for a couple of years gradually progressing and then had sudden onset of severe excruciating leg weakness, difficulty with urination, weakness in his legs, and the MRI scan showed large free fragment caudally migrated from the 4-5 disk space down to the level of the pedicles at L5 causing severe central compression and narrowing of the canal down to 3 or 4 mm.  DESCRIPTION OF PROCEDURE:  After induction of general anesthesia, the patient was placed prone on chest rolls careful padding positioning.  He is a very large man and back was clipped down to the skin large area. DuraPrep, 3 g Ancef prophylaxis.  The area was squared with towels, Betadine, Steri-Drape, and laminectomy sheets and drapes.  Needle localization after time-out procedure, cross-table lateral x-ray confirmed the needle was exactly at 4-5 disk space.  Incision was made to reach old incision.  The BOSS McCulloch retractors had to be used due to exterior deep 90 cm blades due to the thick layer of adipose tissue, and the patient's large body habitus.  This just barely reached down the level of the lamina.  Lamina was thinned with Kerrison rongeur  after cross-table x-ray was taken with Kocher clamps, at planned level of decompression just barely above the disk space to the inferior aspect of the pedicle.  Posterior elements were removed between those 2 areas. Lamina was then thinned with a bur.  There was giant massive chunks of ligament that were removed.  They were adherent to the dura.  Operative microscope was used for microdissection using the dural separator freeing them up from the adhesions, from previous injections, patient had and then removing the thick chunks of ligament.  The disk space on the left side was exposed dura with some difficulty finally was able to be slid down.  Next, the dura protecting anulus was incised and immediately several large chunks of disk were removed.  Once couple of chunks were removed, a small piece was visualized caudally.  This was teased with the black nerve, then grasped with micropituitary pulled partially in the field and grabbed with large pituitary and 1 massive piece that was 4 x 2 x 1 cm was teased out.  There was some remaining disk at the midline at the level of disk space once this large piece was removed.  Hockey stick to be passed anteriorly felt across the opposite pedicle with no areas of compression.  Left inferior to  the disk space, disk was cleaned to be decompressed up and down pituitaries.  Chunks of ligament removed from both gutters up to the level of the pedicle.  Area was irrigated.  Palpation of the dura showed it was nice, smooth, and free and the large bump that was visualized in the dura just prior to removal of the free fragment was gone in the dura set back and normal round tube.  Deep layer closed with #1 Vicryl, 2-0 Vicryl subcutaneous tissue, subcuticular skin closure, postop dressing. Dermabond was applied to the skin and the patient transferred to recovery room.  Instrument count and needle count were correct.     [name] C. Ophelia Charter, M.D.     MCY/MEDQ   D:  01/20/2013  T:  01/21/2013  Job:  [id number]

## 2013-01-24 ENCOUNTER — Encounter (HOSPITAL_COMMUNITY): Payer: Self-pay | Admitting: Orthopaedic Surgery

## 2013-05-29 ENCOUNTER — Other Ambulatory Visit: Payer: Self-pay | Admitting: Otolaryngology

## 2013-05-29 DIAGNOSIS — D34 Benign neoplasm of thyroid gland: Secondary | ICD-10-CM

## 2013-05-31 ENCOUNTER — Ambulatory Visit
Admission: RE | Admit: 2013-05-31 | Discharge: 2013-05-31 | Disposition: A | Discharge status: Home / Self Care | Payer: PRIVATE HEALTH INSURANCE | Source: Ambulatory Visit | Attending: Otolaryngology | Admitting: Otolaryngology

## 2013-05-31 DIAGNOSIS — D34 Benign neoplasm of thyroid gland: Secondary | ICD-10-CM

## 2013-11-23 ENCOUNTER — Other Ambulatory Visit: Payer: Self-pay | Admitting: Otolaryngology

## 2013-11-23 DIAGNOSIS — D34 Benign neoplasm of thyroid gland: Secondary | ICD-10-CM

## 2013-11-27 ENCOUNTER — Ambulatory Visit
Admission: RE | Admit: 2013-11-27 | Discharge: 2013-11-27 | Disposition: A | Discharge status: Home / Self Care | Payer: PRIVATE HEALTH INSURANCE | Source: Ambulatory Visit | Attending: Otolaryngology | Admitting: Otolaryngology

## 2013-11-27 DIAGNOSIS — D34 Benign neoplasm of thyroid gland: Secondary | ICD-10-CM

## 2015-04-29 ENCOUNTER — Other Ambulatory Visit: Payer: Self-pay | Admitting: Otolaryngology

## 2015-04-29 DIAGNOSIS — D34 Benign neoplasm of thyroid gland: Secondary | ICD-10-CM

## 2015-04-30 ENCOUNTER — Ambulatory Visit
Admission: RE | Admit: 2015-04-30 | Discharge: 2015-04-30 | Disposition: A | Discharge status: Home / Self Care | Payer: Medicare Other | Source: Ambulatory Visit | Attending: Otolaryngology | Admitting: Otolaryngology

## 2015-04-30 DIAGNOSIS — D34 Benign neoplasm of thyroid gland: Secondary | ICD-10-CM

## 2015-07-31 DIAGNOSIS — I10 Essential (primary) hypertension: Secondary | ICD-10-CM | POA: Diagnosis not present

## 2015-07-31 DIAGNOSIS — M1990 Unspecified osteoarthritis, unspecified site: Secondary | ICD-10-CM | POA: Diagnosis not present

## 2015-07-31 DIAGNOSIS — I429 Cardiomyopathy, unspecified: Secondary | ICD-10-CM | POA: Diagnosis not present

## 2015-07-31 DIAGNOSIS — Z789 Other specified health status: Secondary | ICD-10-CM | POA: Diagnosis not present

## 2015-08-28 DIAGNOSIS — I10 Essential (primary) hypertension: Secondary | ICD-10-CM | POA: Diagnosis not present

## 2015-08-28 DIAGNOSIS — M1990 Unspecified osteoarthritis, unspecified site: Secondary | ICD-10-CM | POA: Diagnosis not present

## 2015-08-28 DIAGNOSIS — N400 Enlarged prostate without lower urinary tract symptoms: Secondary | ICD-10-CM | POA: Diagnosis not present

## 2015-08-28 DIAGNOSIS — I429 Cardiomyopathy, unspecified: Secondary | ICD-10-CM | POA: Diagnosis not present

## 2015-09-02 DIAGNOSIS — I420 Dilated cardiomyopathy: Secondary | ICD-10-CM | POA: Diagnosis not present

## 2015-09-16 ENCOUNTER — Encounter: Payer: Self-pay | Admitting: *Deleted

## 2015-09-30 ENCOUNTER — Ambulatory Visit: Payer: Medicare Other | Admitting: Cardiovascular Disease

## 2015-10-03 ENCOUNTER — Telehealth: Payer: Self-pay | Admitting: *Deleted

## 2015-10-03 ENCOUNTER — Ambulatory Visit (INDEPENDENT_AMBULATORY_CARE_PROVIDER_SITE_OTHER): Payer: Medicare Other | Admitting: Cardiovascular Disease

## 2015-10-03 ENCOUNTER — Encounter: Payer: Self-pay | Admitting: Cardiovascular Disease

## 2015-10-03 VITALS — BP 132/76 | HR 55 | Ht 74.0 in | Wt 288.0 lb

## 2015-10-03 DIAGNOSIS — I429 Cardiomyopathy, unspecified: Secondary | ICD-10-CM | POA: Diagnosis not present

## 2015-10-03 DIAGNOSIS — Z136 Encounter for screening for cardiovascular disorders: Secondary | ICD-10-CM | POA: Diagnosis not present

## 2015-10-03 DIAGNOSIS — G4730 Sleep apnea, unspecified: Secondary | ICD-10-CM | POA: Diagnosis not present

## 2015-10-03 DIAGNOSIS — I10 Essential (primary) hypertension: Secondary | ICD-10-CM | POA: Diagnosis not present

## 2015-10-03 MED ORDER — CARVEDILOL 6.25 MG PO TABS: 6.2500 mg | ORAL_TABLET | Freq: Two times a day (BID) | ORAL | Status: DC

## 2015-10-03 NOTE — Telephone Encounter (Signed)
Seen in office today.  Bystolic was stopped & changed to Coreg 6.25mg  twice a day.  Patient called to report that he had been on this med in the past (summer 2013), but was intolerant to it .  Stated that he thought it caused palpations and caused rapid heart rate.  Stated that he was under a lot of stress at that time with his mother.  Not sure if he should try again since had been so long.  Message sent to provider for further advice.

## 2015-10-03 NOTE — Patient Instructions (Signed)
   Stop Bystolic  Begin Coreg 6.25mg  twice a day  - new sent to [place] today. Continue all other medications.   Your physician has recommended that you have a sleep study. This test records several body functions during sleep, including: brain activity, eye movement, oxygen and carbon dioxide blood levels, heart rate and rhythm, breathing rate and rhythm, the flow of air through your mouth and nose, snoring, body muscle movements, and chest and belly movement. Office will contact with results via phone or letter.   Follow up in  3 months

## 2015-10-03 NOTE — Telephone Encounter (Signed)
Patient notified.  Patient not due to come back for follow up till August.  If patient tolerates, will you up titrate med prior to this?

## 2015-10-03 NOTE — Progress Notes (Signed)
Patient ID: [name], male   DOB: 03/15/1951, 65 y.o.   MRN: [id number]       CARDIOLOGY CONSULT NOTE  Patient ID: [name] MRN: [id number] DOB/AGE: February 26, 1951 65 y.o.  Admit date: (Not on file) Primary Physician: Glenda Chroman, MD Referring Physician: Woody Seller MD  Reason for Consultation: cardiomyopathy  HPI: The patient is a 65 year old male with a history of hypertension and cardiomyopathy. Echocardiogram in August 2013 demonstrated mildly reduced left ventricular systolic function, EF [id number], mild mitral, aortic, and tricuspid regurgitation, and moderate left atrial enlargement. This is per a review of the study report.  He was evaluated by Dr. Percival Spanish on 02/23/12 for cardiomyopathy and palpitations. He then underwent a submaximal stress test with no evidence of ischemia on 02/25/12.  An echocardiogram performed on 09/02/15 demonstrated mild to moderately reduced left ventricular systolic function, EF [id number], global hypokinesis, moderate left atrial and ventricular enlargement, trivial to mild aortic and mitral regurgitation, and mild tricuspid and pulmonic regurgitation.  Most recent ECG available to me from PCPs office showed sinus bradycardia with no ischemic ST segment or T-wave abnormalities, performed in November 2016.  ECG performed in the office today which I personally interpreted shows the same.  The patient denies any symptoms of chest pain, shortness of breath, lightheadedness, dizziness, leg swelling, orthopnea, PND, and syncope.  For the past 2 weeks he has been walking on a treadmill for 20 minutes daily.  When he lies down at night and everything is quiet, he can "feel his heart beating". There is no associated chest pain, shortness of breath, or orthopnea. He has had some mild ankle swelling for the past 2 years and wears compression stockings with relief. Said he had a thyroid nodule which was biopsied and was benign. Said his blood work was  normal.  His wife is a retired [employer] scientist and has sleep apnea and uses CPAP. She said her husband snores and sometimes stops breathing.  No Known Allergies  Current Outpatient Prescriptions  Medication Sig Dispense Refill  . allopurinol (ZYLOPRIM) 300 MG tablet Take 300 mg by mouth daily.    [name] amLODipine (NORVASC) 5 MG tablet Take 5 mg by mouth daily.    . Calcium Carb-Cholecalciferol (CALCIUM + D3 PO) Take 1 tablet by mouth daily.    . Cholecalciferol (VITAMIN D3) 2000 UNITS TABS Take 1 tablet by mouth daily.    [name] CINNAMON PO Take 350 mg by mouth daily.    . Coenzyme Q10 200 MG capsule Take 200 mg by mouth daily.    . Ferrous Fumarate (IRON) 18 MG TBCR Take 1 tablet by mouth daily.    . fish oil-omega-3 fatty acids 1000 MG capsule Take 830 mg by mouth daily.    . Glucosamine-Chondroitin (GLUCOSAMINE CHONDR COMPLEX PO) Take by mouth daily. 1500mg /1200mg     . LUTEIN-ZEAXANTHIN PO Take 4 mg by mouth daily.    . Multiple Vitamins-Minerals (MULTIVITAMIN PO) Take 1 tablet by mouth daily.    . nebivolol (BYSTOLIC) 10 MG tablet Take 10 mg by mouth every morning. & 5 mg in the evening    . PHOSPHATIDYLCHOLINE PO Take 420 mg by mouth daily.    . Probiotic Product (PROBIOTIC DAILY PO) Take 1 tablet by mouth daily. 5 billion unit daily    . RESVERATROL 100 MG CAPS Take 1 capsule by mouth daily.    . tamsulosin (FLOMAX) 0.4 MG CAPS capsule Take 1 capsule by mouth daily.  5  . valsartan (DIOVAN) 320 MG tablet  Take 320 mg by mouth daily.     No current facility-administered medications for this visit.    Past Medical History  Diagnosis Date  . HTN (hypertension)   . Gout   . Chronic back pain     Spinal stenosis  . Thyroid nodule     Benign  . Atrial fib/flutter, transient   . CAD (coronary artery disease)     Stent x 2  . Neuromuscular disorder (HCC)     numbness in toes  . Arthritis     Past Surgical History  Procedure Laterality Date  . Pilonidal cyst drainage    . Cataract extraction     . Irrigation and debridement sebaceous cyst    . Colonoscopy  2012  . Lumbar laminectomy N/A 01/20/2013    Procedure: MICRODISCECTOMY LUMBAR LAMINECTOMY;  Surgeon: Marybelle Killings, MD;  Location: [facility];  Service: Orthopedics;  Laterality: N/A;  L4-5 Decompression    Social History   Social History  . Marital Status: Married    Spouse Name: N/A  . Number of Children: 3  . Years of Education: N/A   Occupational History  . Not on file.   Social History Main Topics  . Smoking status: Never Smoker   . Smokeless tobacco: Never Used  . Alcohol Use: No  . Drug Use: No  . Sexual Activity: Not on file   Other Topics Concern  . Not on file   Social History Narrative     Fam: Mother had angina in 50-60's. Also had TIA's. Was on warfarin.  Prior to Admission medications   Medication Sig Start Date End Date Taking? Authorizing Provider  allopurinol (ZYLOPRIM) 300 MG tablet Take 300 mg by mouth daily.    Historical Provider, MD  amLODipine (NORVASC) 5 MG tablet Take 5 mg by mouth daily.    Historical Provider, MD  Calcium Carb-Cholecalciferol (CALCIUM + D3 PO) Take 1 tablet by mouth daily.    Historical Provider, MD  Cholecalciferol (VITAMIN D3) 2000 UNITS TABS Take 1 tablet by mouth daily.    Historical Provider, MD  CINNAMON PO Take 350 mg by mouth daily.    Historical Provider, MD  Coenzyme Q10 200 MG capsule Take 200 mg by mouth daily.    Historical Provider, MD  Ferrous Fumarate (IRON) 18 MG TBCR Take 1 tablet by mouth daily.    Historical Provider, MD  fish oil-omega-3 fatty acids 1000 MG capsule Take 830 mg by mouth daily.    Historical Provider, MD  Glucosamine-Chondroitin (GLUCOSAMINE CHONDR COMPLEX PO) Take by mouth daily. 1500mg /1200mg     Historical Provider, MD  ibuprofen (ADVIL,MOTRIN) 200 MG tablet Take 600 mg by mouth every 6 (six) hours as needed for pain.    Historical Provider, MD  Lutein 10 MG TABS Take 1 tablet by mouth daily.    Historical Provider, MD   LUTEIN-ZEAXANTHIN PO Take 4 mg by mouth daily.    Historical Provider, MD  methocarbamol (ROBAXIN) 500 MG tablet Take 1 tablet (500 mg total) by mouth every 6 (six) hours as needed (spasm). 01/20/13   Phillips Hay, PA-C  Multiple Vitamins-Minerals (MULTIVITAMIN PO) Take 1 tablet by mouth daily.    Historical Provider, MD  nebivolol (BYSTOLIC) 10 MG tablet Take 10 mg by mouth 2 (two) times daily.     Historical Provider, MD  oxyCODONE-acetaminophen (ROXICET) 5-325 MG per tablet Take 1-2 tablets by mouth every 4 (four) hours as needed for pain. 01/20/13   Phillips Hay, PA-C  PHOSPHATIDYLCHOLINE PO  Take 420 mg by mouth daily.    Historical Provider, MD  Probiotic Product (PROBIOTIC DAILY PO) Take 1 tablet by mouth daily. 5 billion unit daily    Historical Provider, MD  RESVERATROL 100 MG CAPS Take 1 capsule by mouth daily.    Historical Provider, MD  valsartan (DIOVAN) 320 MG tablet Take 320 mg by mouth daily.    Historical Provider, MD     Review of systems complete and found to be negative unless listed above in HPI     Physical exam Blood pressure 132/76, pulse 55, height 6\' 2"  (1.88 m), weight 288 lb (130.636 kg). General: NAD Neck: No JVD, no thyromegaly or thyroid nodule.  Lungs: Clear to auscultation bilaterally with normal respiratory effort. CV: Nondisplaced PMI. Regular rate and rhythm, normal S1/S2, no S3/S4, no murmur.  No peripheral edema.  No carotid bruit.  Normal pedal pulses.  Abdomen: Soft, nontender, obese, no distention.  Skin: Intact without lesions or rashes.  Neurologic: Alert and oriented x 3.  Psych: Normal affect. Extremities: No clubbing or cyanosis.  HEENT: Normal.   ECG: Most recent ECG reviewed.  Labs:   Lab Results  Component Value Date   WBC 8.3 01/19/2013   HGB 15.0 01/19/2013   HCT 42.8 01/19/2013   MCV 88.6 01/19/2013   PLT 128* 01/19/2013   No results for input(s): NA, K, CL, CO2, BUN, CREATININE, CALCIUM, PROT, BILITOT, ALKPHOS, ALT, AST,  GLUCOSE in the last 168 hours.  Invalid input(s): LABALBU No results found for: CKTOTAL, CKMB, CKMBINDEX, TROPONINI No results found for: CHOL No results found for: HDL No results found for: LDLCALC No results found for: TRIG No results found for: CHOLHDL No results found for: LDLDIRECT       Studies: No results found.  ASSESSMENT AND PLAN:  1. Cardiomyopathy: Given normal nuclear MPI study in 2013, I suspect this is non-ischemic in etiology. Currently on Bystolic and Diovan. I will switch Bystolic to Coreg 123XX123 mg BID with the hopes of improving cardiac function over time if possible. At this point, I will not obtain a stress test. I will likely reevaluate cardiac function in 6-12 months.  2. Essential HTN: Controlled. Monitor given switch from Bystolic to Coreg.  3. Sleep apnea: Will obtain sleep study.  Dispo: fu 3 months.   Signed: Kate Sable, M.D., F.A.C.C.  10/03/2015, 9:17 AM

## 2015-10-03 NOTE — Telephone Encounter (Signed)
I would try again. Could start at lower dose 3.125 mg BID initially.

## 2015-10-03 NOTE — Telephone Encounter (Signed)
Have him check his BP at home if possible 3-4 times per week after he has been on Coreg for at least 5-7 days. Have him do this for 4 weeks and provide me with those results so I can determine if the dose needs to be increased.

## 2015-10-04 ENCOUNTER — Ambulatory Visit: Payer: Medicare Other | Attending: Cardiovascular Disease | Admitting: Neurology

## 2015-10-04 DIAGNOSIS — G4730 Sleep apnea, unspecified: Secondary | ICD-10-CM | POA: Insufficient documentation

## 2015-10-04 DIAGNOSIS — G4733 Obstructive sleep apnea (adult) (pediatric): Secondary | ICD-10-CM | POA: Diagnosis not present

## 2015-10-04 NOTE — Telephone Encounter (Signed)
Patient notified via voice mail.  BP log sheet mailed to patient.

## 2015-10-13 NOTE — Procedures (Signed)
[place] A. Merlene Laughter, MD     [email or web address]             NOCTURNAL POLYSOMNOGRAPHY   LOCATION: [facility]   Patient Name: [name], [name] Date: 10/04/2015 Gender: Male D.O.B: March 02, 1951 Age (years): 42 Referring Provider: Not Available Height (inches): 74 Interpreting Physician: Phillips Odor MD, ABSM Weight (lbs): 288 RPSGT: Peak, Robert BMI: 37 MRN: [id number] Neck Size: 18.50 CLINICAL INFORMATION Sleep Study Type: NPSG Indication for sleep study: N/A Epworth Sleepiness Score: 5 SLEEP STUDY TECHNIQUE As per the AASM Manual for the Scoring of Sleep and Associated Events v2.3 (April 2016) with a hypopnea requiring 4% desaturations. The channels recorded and monitored were frontal, central and occipital EEG, electrooculogram (EOG), submentalis EMG (chin), nasal and oral airflow, thoracic and abdominal wall motion, anterior tibialis EMG, snore microphone, electrocardiogram, and pulse oximetry. MEDICATIONS Patient's medications include: N/A. Medications self-administered by patient during sleep study : No sleep medicine administered.  Current outpatient prescriptions:  .  allopurinol (ZYLOPRIM) 300 MG tablet, Take 300 mg by mouth daily., Disp: , Rfl:  .  amLODipine (NORVASC) 5 MG tablet, Take 5 mg by mouth daily., Disp: , Rfl:  .  Calcium Carb-Cholecalciferol (CALCIUM + D3 PO), Take 1 tablet by mouth daily., Disp: , Rfl:  .  carvedilol (COREG) 6.25 MG tablet, Take 1 tablet (6.25 mg total) by mouth 2 (two) times daily., Disp: 60 tablet, Rfl: 6 .  Cholecalciferol (VITAMIN D3) 2000 UNITS TABS, Take 1 tablet by mouth daily., Disp: , Rfl:  .  CINNAMON PO, Take 350 mg by mouth daily., Disp: , Rfl:  .  Coenzyme Q10 200 MG capsule, Take 200 mg by mouth daily., Disp: , Rfl:  .  Ferrous Fumarate (IRON) 18 MG TBCR, Take 1 tablet by mouth daily., Disp: , Rfl:  .  fish oil-omega-3 fatty acids 1000 MG capsule, Take 830 mg by mouth daily., Disp: , Rfl:  .   Glucosamine-Chondroitin (GLUCOSAMINE CHONDR COMPLEX PO), Take by mouth daily. 1500mg /1200mg , Disp: , Rfl:  .  LUTEIN-ZEAXANTHIN PO, Take 4 mg by mouth daily., Disp: , Rfl:  .  Multiple Vitamins-Minerals (MULTIVITAMIN PO), Take 1 tablet by mouth daily., Disp: , Rfl:  .  PHOSPHATIDYLCHOLINE PO, Take 420 mg by mouth daily., Disp: , Rfl:  .  Probiotic Product (PROBIOTIC DAILY PO), Take 1 tablet by mouth daily. 5 billion unit daily, Disp: , Rfl:  .  RESVERATROL 100 MG CAPS, Take 1 capsule by mouth daily., Disp: , Rfl:  .  tamsulosin (FLOMAX) 0.4 MG CAPS capsule, Take 1 capsule by mouth daily., Disp: , Rfl: 5 .  valsartan (DIOVAN) 320 MG tablet, Take 320 mg by mouth daily., Disp: , Rfl:   SLEEP ARCHITECTURE The study was initiated at 10:27:13 PM and ended at 4:59:21 AM. Sleep onset time was 19.7 minutes and the sleep efficiency was 72.3%. The total sleep time was 283.5 minutes. Stage REM latency was 82.5 minutes. The patient spent 2.47% of the night in stage N1 sleep, 74.60% in stage N2 sleep, 16.23% in stage N3 and 6.70% in REM. Alpha intrusion was absent. Supine sleep was 0.00%. RESPIRATORY PARAMETERS The overall apnea/hypopnea index (AHI) was 2.3 per hour. There were 0 total apneas, including 0 obstructive, 0 central and 0 mixed apneas. There were 11 hypopneas and 2 RERAs. The AHI during Stage REM sleep was 15.8 per hour. AHI while supine was N/A per hour. The mean oxygen saturation was 93.36%. The minimum SpO2 during sleep was 84.00%. snoring was noted during  this study. CARDIAC DATA The 2 lead EKG demonstrated sinus rhythm. The mean heart rate was 53.95 beats per minute. Other EKG findings include: PVCs. LEG MOVEMENT DATA The total PLMS were 220 with a resulting PLMS index of 46.57. Associated arousal with leg movement index was 0.6.   IMPRESSIONS ? 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 No significant associated arousals.  Delano Metz, MD Diplomate,  American Board of Sleep Medicine.

## 2015-10-16 ENCOUNTER — Telehealth: Payer: Self-pay | Admitting: *Deleted

## 2015-10-16 DIAGNOSIS — G479 Sleep disorder, unspecified: Secondary | ICD-10-CM

## 2015-10-16 NOTE — Telephone Encounter (Addendum)
Please refer to sleep specialist. (sleep disorder)       ----- Message -----    From: Herminio Commons, MD    Sent: 10/13/2015  7:17 PM

## 2015-10-16 NOTE — Telephone Encounter (Addendum)
Copy to pmd.  Left message to return call.

## 2015-10-16 NOTE — Telephone Encounter (Signed)
Patient notified.  He agrees to see sleep specialist.  Will put referral order in for [facility].

## 2015-10-16 NOTE — Telephone Encounter (Signed)
IMPRESSIONS ? 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 No significant associated arousals.  Frank Metz, MD Diplomate, American Board of Sleep Medicine.

## 2015-10-31 ENCOUNTER — Ambulatory Visit (INDEPENDENT_AMBULATORY_CARE_PROVIDER_SITE_OTHER): Payer: Medicare Other | Admitting: Neurology

## 2015-10-31 ENCOUNTER — Encounter: Payer: Self-pay | Admitting: Neurology

## 2015-10-31 VITALS — BP 176/94 | HR 68 | Resp 16 | Ht 74.0 in | Wt 285.0 lb

## 2015-10-31 DIAGNOSIS — R200 Anesthesia of skin: Secondary | ICD-10-CM | POA: Diagnosis not present

## 2015-10-31 DIAGNOSIS — G4761 Periodic limb movement disorder: Secondary | ICD-10-CM | POA: Diagnosis not present

## 2015-10-31 DIAGNOSIS — G2581 Restless legs syndrome: Secondary | ICD-10-CM | POA: Insufficient documentation

## 2015-10-31 DIAGNOSIS — D509 Iron deficiency anemia, unspecified: Secondary | ICD-10-CM | POA: Diagnosis not present

## 2015-10-31 DIAGNOSIS — G629 Polyneuropathy, unspecified: Secondary | ICD-10-CM | POA: Diagnosis not present
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
## 2015-11-04 LAB — MULTIPLE MYELOMA PANEL, SERUM
ALBUMIN SERPL ELPH-MCNC: 3.7 g/dL (ref 2.9–4.4)
ALPHA 1: 0.2 g/dL (ref 0.0–0.4)
Albumin/Glob SerPl: 1.3 (ref 0.7–1.7)
Alpha2 Glob SerPl Elph-Mcnc: 0.6 g/dL (ref 0.4–1.0)
B-Globulin SerPl Elph-Mcnc: 1 g/dL (ref 0.7–1.3)
GAMMA GLOB SERPL ELPH-MCNC: 1.2 g/dL (ref 0.4–1.8)
GLOBULIN, TOTAL: 3 g/dL (ref 2.2–3.9)
IGA/IMMUNOGLOBULIN A, SERUM: 137 mg/dL (ref 61–437)
IGM (IMMUNOGLOBULIN M), SRM: 83 mg/dL (ref 20–172)
IgG (Immunoglobin G), Serum: 1116 mg/dL (ref 700–1600)
TOTAL PROTEIN: 6.7 g/dL (ref 6.0–8.5)

## 2015-11-04 LAB — FERRITIN: FERRITIN: 154 ng/mL (ref 30–400)

## 2015-11-04 LAB — VITAMIN B12: VITAMIN B 12: 434 pg/mL (ref 211–946)

## 2015-11-05 ENCOUNTER — Telehealth: Payer: Self-pay | Admitting: *Deleted

## 2015-11-05 NOTE — Telephone Encounter (Signed)
-----   Message from Britt Bottom, MD sent at 11/04/2015  7:36 PM EDT ----- Pleas let him know the polyneuropathy labs look normal

## 2015-11-05 NOTE — Telephone Encounter (Signed)
I called and LMVM for pt to call back for lab results.  If he calls may relay labs look normal per Dr. Felecia Shelling.

## 2015-11-06 NOTE — Telephone Encounter (Signed)
Patient returned Sandy's call. Relayed message below, labs look normal.

## 2015-11-06 NOTE — Telephone Encounter (Signed)
Noted. Thanks.

## 2015-11-18 ENCOUNTER — Telehealth: Payer: Self-pay | Admitting: *Deleted

## 2015-11-18 MED ORDER — AMLODIPINE BESYLATE 10 MG PO TABS: 10.0000 mg | ORAL_TABLET | Freq: Every day | ORAL | Status: DC

## 2015-11-18 NOTE — Telephone Encounter (Signed)
Patient informed and verbalized understanding of plan. 

## 2015-11-19 DIAGNOSIS — I10 Essential (primary) hypertension: Secondary | ICD-10-CM | POA: Diagnosis not present

## 2016-01-07 ENCOUNTER — Encounter: Payer: Self-pay | Admitting: Adult Health

## 2016-01-07 ENCOUNTER — Ambulatory Visit (INDEPENDENT_AMBULATORY_CARE_PROVIDER_SITE_OTHER): Payer: Medicare Other | Admitting: Adult Health

## 2016-01-07 VITALS — BP 131/78 | HR 73 | Ht 74.0 in | Wt 289.0 lb

## 2016-01-07 DIAGNOSIS — I5030 Unspecified diastolic (congestive) heart failure: Secondary | ICD-10-CM

## 2016-01-07 DIAGNOSIS — I10 Essential (primary) hypertension: Secondary | ICD-10-CM | POA: Diagnosis not present

## 2016-01-07 DIAGNOSIS — I501 Left ventricular failure: Secondary | ICD-10-CM | POA: Diagnosis not present

## 2016-01-07 MED ORDER — CARVEDILOL 3.125 MG PO TABS: 3.1250 mg | ORAL_TABLET | Freq: Two times a day (BID) | ORAL | 3 refills | Status: DC

## 2016-01-07 NOTE — Progress Notes (Signed)
Cardiology Office Note   Date:  01/07/2016   ID:  [name], DOB [birth year], MRN [id number]  PCP:  Glenda Chroman, MD  Cardiologist:  Woodroe Chen, [name]   Chief Complaint  Patient presents with  . Cardiomyopathy  . Hypertension      History of Present Illness: [name] is a 65 y.o. male who presents for ongoing assessment and management of hypertension and cardiomyopathy, with systolic function reduced at 45-50%, mild mitral and aortic and tricuspid regurgitation with global hypokinesis.. Moderate left atrial enlargement.  The patient underwent a submaximal stress test with no evidence of ischemia in October 2013. He was last seen by Dr. Dr. Bronson Ing on 10/03/2015. He was switched from bysystolic to Coreg. A sleep study ordered and he was to follow-up in 3 months.  He comes today without any complaint. He is not having any issues with fluid retention chest pain or dyspnea. He has A record of his blood pressures at home and found him to be in no 130 to [id number] range systolic, over 0000000 and [id number] diastolic. He has been asymptomatic.  Past Medical History:  Diagnosis Date  . Arthritis   . Atrial fib/flutter, transient   . CAD (coronary artery disease)    Stent x 2  . Chronic back pain    Spinal stenosis  . Gout   . HTN (hypertension)   . Neuromuscular disorder (HCC)    numbness in toes  . Thyroid nodule    Benign    Past Surgical History:  Procedure Laterality Date  . CATARACT EXTRACTION    . COLONOSCOPY  2012  . IRRIGATION AND DEBRIDEMENT SEBACEOUS CYST    . LUMBAR LAMINECTOMY N/A 01/20/2013   Procedure: MICRODISCECTOMY LUMBAR LAMINECTOMY;  Surgeon: Marybelle Killings, MD;  Location: [place];  Service: Orthopedics;  Laterality: N/A;  L4-5 Decompression  . PILONIDAL CYST DRAINAGE       Current Outpatient Prescriptions  Medication Sig Dispense Refill  . allopurinol (ZYLOPRIM) 300 MG tablet Take 300 mg by mouth daily.    [name] amLODipine (NORVASC) 10 MG  tablet Take 1 tablet (10 mg total) by mouth daily. 90 tablet 3  . Calcium Carb-Cholecalciferol (CALCIUM + D3 PO) Take 1 tablet by mouth daily.    . Cholecalciferol (VITAMIN D3) 2000 UNITS TABS Take 1 tablet by mouth daily.    [name] CINNAMON PO Take 350 mg by mouth daily.    . Coenzyme Q10 200 MG capsule Take 200 mg by mouth daily.    . Ferrous Fumarate (IRON) 18 MG TBCR Take 1 tablet by mouth daily.    . fish oil-omega-3 fatty acids 1000 MG capsule Take 830 mg by mouth daily.    . Glucosamine-Chondroitin (GLUCOSAMINE CHONDR COMPLEX PO) Take by mouth daily. 1500mg /1200mg     . LUTEIN-ZEAXANTHIN PO Take 4 mg by mouth daily.    . Multiple Vitamins-Minerals (MULTIVITAMIN PO) Take 1 tablet by mouth daily.    [name] PHOSPHATIDYLCHOLINE PO Take 420 mg by mouth daily.    . Probiotic Product (PROBIOTIC DAILY PO) Take 1 tablet by mouth daily. 5 billion unit daily    . RESVERATROL 100 MG CAPS Take 1 capsule by mouth daily.    . tamsulosin (FLOMAX) 0.4 MG CAPS capsule Take 1 capsule by mouth daily.  5  . valsartan (DIOVAN) 320 MG tablet Take 320 mg by mouth daily.    . carvedilol (COREG) 3.125 MG tablet Take 1 tablet (3.125 mg total) by mouth 2 (two) times daily.  180 tablet 3   No current facility-administered medications for this visit.     Allergies:   Review of patient's allergies indicates no known allergies.    Social History:  The patient  reports that he has never smoked. He has never used smokeless tobacco. He reports that he does not drink alcohol or use drugs.   Family History:  The patient's family history includes Atrial fibrillation in his mother; Cancer in his brother and father; Dementia in his mother; Lung cancer in his father; Transient ischemic attack in his mother.    ROS: All other systems are reviewed and negative. Unless otherwise mentioned in H&P    PHYSICAL EXAM: VS:  BP 131/78   Pulse 73   Ht 6\' 2"  (1.88 m)   Wt 289 lb (131.1 kg)   BMI 37.11 kg/m  , BMI Body mass index is  37.11 kg/m. GEN: Well nourished, well developed, in no acute distress  HEENT: normal  Neck: no JVD, carotid bruits, or masses Cardiac: RRR; no murmurs, rubs, or gallops,no edema  Respiratory:  clear to auscultation bilaterally, normal work of breathing GI: soft, nontender, nondistended, + BS MS: no deformity or atrophy  Skin: warm and dry, no rash Neuro:  Strength and sensation are intact Psych: euthymic mood, full affect   Recent Labs: No results found for requested labs within last 8760 hours.    Lipid Panel No results found for: CHOL, TRIG, HDL, CHOLHDL, VLDL, LDLCALC, LDLDIRECT    Wt Readings from Last 3 Encounters:  01/07/16 289 lb (131.1 kg)  10/31/15 285 lb (129.3 kg)  10/04/15 288 lb (130.6 kg)     ASSESSMENT AND PLAN:  1. Systolic dysfunction: Most recent ejection fraction 40-45%. He continues on carvedilol 3.125 mg twice a day. He is asked that we send a prescription for that dose as he has been cutting in 6.25 mg. We will do this for 90 day supply. He will have a follow-up echocardiogram in 3 months with a follow-up appointment thereafter to discuss his LV function status.  2. Hypertension: Blood pressure is excellently controlled on current medication regimen. Will not make any changes. Labs are being followed by Dr. Woody Seller.   Current medicines are reviewed at length with the patient today.    Labs/ tests ordered today include:   Orders Placed This Encounter  Procedures  . ECHOCARDIOGRAM COMPLETE     Disposition:   FU with 3 months  Signed, Frank Sims, [name]  01/07/2016 9:14 AM    [facility] [address], [place], [place] [address] Phone: [phone]; Fax: [phone]

## 2016-01-07 NOTE — Patient Instructions (Signed)
Your physician recommends that you schedule a follow-up appointment in: [place]  Your physician recommends that you continue on your current medications as directed. Please refer to the Current Medication list given to you today.  WE HAVE SENT NEW RX FOR COREG 3.125 MG TWICE DAILY WITH 12 DAY SUPPLY  Your physician has requested that you have an echocardiogram JUST PRIOR TO YOUR NEXT VISIT. Echocardiography is a painless test that uses sound waves to create images of your heart. It provides your doctor with information about the size and shape of your heart and how well your heart's chambers and valves are working. This procedure takes approximately one hour. There are no restrictions for this procedure.  Thank you for choosing [facility]!!

## 2016-01-07 NOTE — Progress Notes (Signed)
Name: [name]    DOB: 05-Oct-1950  Age: 65 y.o.  MR#: [id number]       PCP:  Glenda Chroman, MD      Insurance: Payor: MEDICARE / Plan: MEDICARE PART A AND B / Product Type: *No Product type* /   CC:   No chief complaint on file.   VS Vitals:   01/07/16 0833  BP: 131/78  Pulse: 73  Weight: 289 lb (131.1 kg)  Height: _0  (1.88 m)    Weights Current Weight  01/07/16 289 lb (131.1 kg)  10/31/15 285 lb (129.3 kg)  10/04/15 288 lb (130.6 kg)    Blood Pressure  BP Readings from Last 3 Encounters:  01/07/16 131/78  10/31/15 (!) 176/94  10/03/15 132/76     Admit date:  (Not on file) Last encounter with RMR:  Visit date not found   Allergy Review of patient's allergies indicates no known allergies.  Current Outpatient Prescriptions  Medication Sig Dispense Refill  . allopurinol (ZYLOPRIM) 300 MG tablet Take 300 mg by mouth daily.    [name] amLODipine (NORVASC) 10 MG tablet Take 1 tablet (10 mg total) by mouth daily. 90 tablet 3  . Calcium Carb-Cholecalciferol (CALCIUM + D3 PO) Take 1 tablet by mouth daily.    . carvedilol (COREG) 6.25 MG tablet Take 3.25 mg by mouth 2 (two) times daily with a meal.    . Cholecalciferol (VITAMIN D3) 2000 UNITS TABS Take 1 tablet by mouth daily.    [name] CINNAMON PO Take 350 mg by mouth daily.    . Coenzyme Q10 200 MG capsule Take 200 mg by mouth daily.    . Ferrous Fumarate (IRON) 18 MG TBCR Take 1 tablet by mouth daily.    . fish oil-omega-3 fatty acids 1000 MG capsule Take 830 mg by mouth daily.    . Glucosamine-Chondroitin (GLUCOSAMINE CHONDR COMPLEX PO) Take by mouth daily. 1521m/1200mg    . LUTEIN-ZEAXANTHIN PO Take 4 mg by mouth daily.    . Multiple Vitamins-Minerals (MULTIVITAMIN PO) Take 1 tablet by mouth daily.    .Marland KitchenPHOSPHATIDYLCHOLINE PO Take 420 mg by mouth daily.    . Probiotic Product (PROBIOTIC DAILY PO) Take 1 tablet by mouth daily. 5 billion unit daily    . RESVERATROL 100 MG CAPS Take 1 capsule by mouth daily.    . tamsulosin  (FLOMAX) 0.4 MG CAPS capsule Take 1 capsule by mouth daily.  5  . valsartan (DIOVAN) 320 MG tablet Take 320 mg by mouth daily.     No current facility-administered medications for this visit.     Discontinued Meds:    Medications Discontinued During This Encounter  Medication Reason  . carvedilol (COREG) 63.83MG tablet Duplicate    Patient Active Problem List   Diagnosis Date Noted  . 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 Numbness 10/31/2015  . Restless leg 10/31/2015  . [place]urop[place] ([place] 10/31/2015  . Anemia, iron deficiency 10/31/2015  . HNP (herniated nucleus pulposus), lumbar 01/20/2013    Class: Diagnosis of  . Abnormal echocardiogram 02/23/2012    LABS    Component Value Date/Time   NA 141 01/19/2013 0914   NA 139 12/17/2009 0911   K 4.4 01/19/2013 0914   K 3.5 12/17/2009 0911   CL 106 01/19/2013 0914   CL 106 12/17/2009 0911   CO2 26 01/19/2013 0914   CO2 26 12/17/2009 0911   GLUCOSE 101 (H) 01/19/2013 0914   GLUCOSE 110 (H) 12/17/2009 08184  BUN 17 01/19/2013 0914   BUN 18 12/17/2009 0911   CREATININE 0.92 01/19/2013 0914   CREATININE 1.17 12/17/2009 0911   CALCIUM 8.9 01/19/2013 0914   CALCIUM 8.7 12/17/2009 0911   GFRNONAA 88 (L) 01/19/2013 0914   GFRNONAA >60 12/17/2009 0911   GFRAA >90 01/19/2013 0914   GFRAA  12/17/2009 0911    >60        The eGFR has been calculated using the MDRD equation. This calculation has not been validated in all clinical situations. eGFR's persistently <60 mL/min signify possible Chronic Kidney Disease.   CMP     Component Value Date/Time   NA 141 01/19/2013 0914   K 4.4 01/19/2013 0914   CL 106 01/19/2013 0914   CO2 26 01/19/2013 0914   GLUCOSE 101 (H) 01/19/2013 0914   BUN 17 01/19/2013 0914   CREATININE 0.92 01/19/2013 0914   CALCIUM 8.9 01/19/2013 0914   PROT 6.7 10/31/2015 1413   ALBUMIN 3.7 01/19/2013 0914   AST 22 01/19/2013 0914   ALT 25 01/19/2013 0914   ALKPHOS 69 01/19/2013 0914   BILITOT 1.1  01/19/2013 0914   GFRNONAA 88 (L) 01/19/2013 0914   GFRAA >90 01/19/2013 0914       Component Value Date/Time   WBC 8.3 01/19/2013 0914   WBC 5.8 12/17/2009 0911   HGB 15.0 01/19/2013 0914   HGB 13.3 12/17/2009 0911   HCT 42.8 01/19/2013 0914   HCT 38.6 (L) 12/17/2009 0911   MCV 88.6 01/19/2013 0914   MCV 86.1 12/17/2009 0911    Lipid Panel  No results found for: CHOL, TRIG, HDL, CHOLHDL, VLDL, LDLCALC, LDLDIRECT  ABG No results found for: PHART, PCO2ART, PO2ART, HCO3, TCO2, ACIDBASEDEF, O2SAT   No results found for: TSH BNP (last 3 results) No results for input(s): BNP in the last 8760 hours.  ProBNP (last 3 results) No results for input(s): PROBNP in the last 8760 hours.  Cardiac Panel (last 3 results) No results for input(s): CKTOTAL, CKMB, TROPONINI, RELINDX in the last 72 hours.  Iron/TIBC/Ferritin/ %Sat    Component Value Date/Time   FERRITIN 154 10/31/2015 1413

## 2016-01-08 DIAGNOSIS — Z713 Dietary counseling and surveillance: Secondary | ICD-10-CM | POA: Diagnosis not present

## 2016-01-08 DIAGNOSIS — Z299 Encounter for prophylactic measures, unspecified: Secondary | ICD-10-CM | POA: Diagnosis not present

## 2016-01-08 DIAGNOSIS — I429 Cardiomyopathy, unspecified: Secondary | ICD-10-CM | POA: Diagnosis not present

## 2016-01-08 DIAGNOSIS — Z6836 Body mass index (BMI) 36.0-36.9, adult: Secondary | ICD-10-CM | POA: Diagnosis not present

## 2016-01-08 DIAGNOSIS — I10 Essential (primary) hypertension: Secondary | ICD-10-CM | POA: Diagnosis not present

## 2016-02-19 DIAGNOSIS — I10 Essential (primary) hypertension: Secondary | ICD-10-CM | POA: Diagnosis not present

## 2016-03-06 DIAGNOSIS — I10 Essential (primary) hypertension: Secondary | ICD-10-CM | POA: Diagnosis not present

## 2016-04-02 DIAGNOSIS — M62838 Other muscle spasm: Secondary | ICD-10-CM | POA: Diagnosis not present

## 2016-04-02 DIAGNOSIS — Z299 Encounter for prophylactic measures, unspecified: Secondary | ICD-10-CM | POA: Diagnosis not present

## 2016-04-02 DIAGNOSIS — I429 Cardiomyopathy, unspecified: Secondary | ICD-10-CM | POA: Diagnosis not present

## 2016-04-09 ENCOUNTER — Other Ambulatory Visit: Payer: Self-pay

## 2016-04-09 ENCOUNTER — Ambulatory Visit (INDEPENDENT_AMBULATORY_CARE_PROVIDER_SITE_OTHER): Payer: Medicare Other

## 2016-04-09 DIAGNOSIS — I501 Left ventricular failure: Secondary | ICD-10-CM | POA: Diagnosis not present

## 2016-04-09 DIAGNOSIS — I5030 Unspecified diastolic (congestive) heart failure: Secondary | ICD-10-CM

## 2016-04-20 ENCOUNTER — Encounter: Payer: Self-pay | Admitting: Cardiovascular Disease

## 2016-04-20 ENCOUNTER — Encounter: Payer: Self-pay | Admitting: *Deleted

## 2016-04-20 ENCOUNTER — Ambulatory Visit (INDEPENDENT_AMBULATORY_CARE_PROVIDER_SITE_OTHER): Payer: Medicare Other | Admitting: Cardiovascular Disease

## 2016-04-20 VITALS — BP 130/74 | HR 75 | Ht 74.4 in | Wt 291.4 lb

## 2016-04-20 DIAGNOSIS — I10 Essential (primary) hypertension: Secondary | ICD-10-CM

## 2016-04-20 DIAGNOSIS — I429 Cardiomyopathy, unspecified: Secondary | ICD-10-CM | POA: Diagnosis not present

## 2016-04-20 NOTE — Progress Notes (Signed)
SUBJECTIVE: The patient presents for follow-up of chronic systolic heart failure.  Echocardiogram 04/09/16 showed mildly reduced left ventricular systolic function, [id number] [id number], mild LVH, grade 1 diastolic dysfunction, and mild hypokinesis of the mid inferolateral and apical lateral myocardium with mild aortic, mitral, and pulmonic regurgitation. There was moderate left atrial dilatation.  He denies exertional chest pain and shortness of breath. He wears compression stockings. Energy levels have not declined in the past year.   Review of Systems: As per "subjective", otherwise negative.  No Known Allergies  Current Outpatient Prescriptions  Medication Sig Dispense Refill  . allopurinol (ZYLOPRIM) 300 MG tablet Take 300 mg by mouth daily.    [name] amLODipine (NORVASC) 10 MG tablet Take 1 tablet (10 mg total) by mouth daily. 90 tablet 3  . Calcium Carb-Cholecalciferol (CALCIUM + D3 PO) Take 1 tablet by mouth daily.    . Cholecalciferol (VITAMIN D3) 2000 UNITS TABS Take 1 tablet by mouth daily.    [name] CINNAMON PO Take 350 mg by mouth daily.    . Coenzyme Q10 200 MG capsule Take 200 mg by mouth daily.    . Ferrous Fumarate (IRON) 18 MG TBCR Take 1 tablet by mouth daily.    . fish oil-omega-3 fatty acids 1000 MG capsule Take 830 mg by mouth daily.    . Glucosamine-Chondroitin (GLUCOSAMINE CHONDR COMPLEX PO) Take by mouth daily. 1500mg /1200mg     . LUTEIN-ZEAXANTHIN PO Take 4 mg by mouth daily.    . Multiple Vitamins-Minerals (MULTIVITAMIN PO) Take 1 tablet by mouth daily.    [name] PHOSPHATIDYLCHOLINE PO Take 420 mg by mouth daily.    . Probiotic Product (PROBIOTIC DAILY PO) Take 1 tablet by mouth daily. 5 billion unit daily    . RESVERATROL 100 MG CAPS Take 1 capsule by mouth daily.    . tamsulosin (FLOMAX) 0.4 MG CAPS capsule Take 1 capsule by mouth daily.  5  . valsartan (DIOVAN) 320 MG tablet Take 320 mg by mouth daily.    . carvedilol (COREG) 3.125 MG tablet Take 1 tablet (3.125 mg total) by  mouth 2 (two) times daily. 180 tablet 3   No current facility-administered medications for this visit.     Past Medical History:  Diagnosis Date  . Arthritis   . Atrial fib/flutter, transient   . CAD (coronary artery disease)    Stent x 2  . Chronic back pain    Spinal stenosis  . Gout   . HTN (hypertension)   . Neuromuscular disorder (HCC)    numbness in toes  . Thyroid nodule    Benign    Past Surgical History:  Procedure Laterality Date  . CATARACT EXTRACTION    . COLONOSCOPY  2012  . IRRIGATION AND DEBRIDEMENT SEBACEOUS CYST    . LUMBAR LAMINECTOMY N/A 01/20/2013   Procedure: MICRODISCECTOMY LUMBAR LAMINECTOMY;  Surgeon: Marybelle Killings, MD;  Location: [place];  Service: Orthopedics;  Laterality: N/A;  L4-5 Decompression  . PILONIDAL CYST DRAINAGE      Social History   Social History  . Marital status: Married    Spouse name: N/A  . Number of children: 3  . Years of education: N/A   Occupational History  . Not on file.   Social History Main Topics  . Smoking status: Never Smoker  . Smokeless tobacco: Never Used  . Alcohol use No  . Drug use: No  . Sexual activity: Not on file   Other Topics Concern  . Not on  file   Social History Narrative  . No narrative on file     Vitals:   04/20/16 0909  BP: 130/74  Pulse: 75  SpO2: 99%  Weight: 291 lb 6.4 oz (132.2 kg)  Height: 6' 2.4" (1.89 m)    PHYSICAL EXAM General: NAD HEENT: Normal. Neck: No JVD, no thyromegaly. Lungs: Clear to auscultation bilaterally with normal respiratory effort. CV: Nondisplaced PMI.  Regular rate and rhythm, normal S1/S2, no S3/S4, no murmur. No pretibial or periankle edema.     Abdomen: Obese.  Neurologic: Alert and oriented.  Psych: Normal affect. Skin: Normal. Musculoskeletal: No gross deformities.    ECG: Most recent ECG reviewed.      ASSESSMENT AND PLAN: 1. Cardiomyopathy: Given wall motion abnormalities, will order a nuclear stress test to evaluate for  ischemic/reversible etiologies. Continue Coreg and ARB.  2. Essential HTN: Controlled. No changes.   Dispo: fu 1 yr  Kate Sable, M.D., F.A.C.C.

## 2016-04-20 NOTE — Addendum Note (Signed)
Addended by: Laurine Blazer on: 04/20/2016 09:38 AM   Modules accepted: Orders

## 2016-04-20 NOTE — Patient Instructions (Addendum)
Medication Instructions:  Continue all current medications.  Labwork: none  Testing/Procedures:  Your physician has requested that you have a lexiscan myoview. For further information please visit [email or web address]. Please follow instruction sheet, as given.  Office will contact with results via phone or letter.    Follow-Up: Your physician wants you to follow up in:  1 year.  You will receive a reminder letter in the mail one-two months in advance.  If you don't receive a letter, please call our office to schedule the follow up appointment   Any Other Special Instructions Will Be Listed Below (If Applicable).  If you need a refill on your cardiac medications before your next appointment, please call your pharmacy.  

## 2016-04-21 ENCOUNTER — Encounter (HOSPITAL_COMMUNITY): Payer: Self-pay

## 2016-04-21 ENCOUNTER — Encounter (HOSPITAL_COMMUNITY)
Admission: RE | Admit: 2016-04-21 | Discharge: 2016-04-21 | Disposition: A | Discharge status: Home / Self Care | Payer: Medicare Other | Source: Ambulatory Visit | Attending: Cardiovascular Disease | Admitting: Cardiovascular Disease

## 2016-04-21 ENCOUNTER — Inpatient Hospital Stay (HOSPITAL_COMMUNITY): Admission: RE | Admit: 2016-04-21 | Payer: Medicare Other | Source: Ambulatory Visit

## 2016-04-21 DIAGNOSIS — I429 Cardiomyopathy, unspecified: Secondary | ICD-10-CM | POA: Diagnosis not present

## 2016-04-21 LAB — NM MYOCAR MULTI W/SPECT W/WALL MOTION / EF
CHL CUP NUCLEAR SDS: 2
CHL CUP NUCLEAR SRS: 6
CHL CUP NUCLEAR SSS: 8
CHL CUP RESTING HR STRESS: 65 {beats}/min
LHR: 0.11
LV dias vol: 146 mL (ref 62–150)
LV sys vol: 69 mL
NUC STRESS TID: 1.03
Peak HR: 96 {beats}/min

## 2016-04-21 MED ORDER — TECHNETIUM TC 99M TETROFOSMIN IV KIT
10.0000 | PACK | Freq: Once | INTRAVENOUS | Status: AC | PRN
  Administered 2016-04-21: 10.7 via INTRAVENOUS

## 2016-04-21 MED ORDER — REGADENOSON 0.4 MG/5ML IV SOLN
INTRAVENOUS | Status: AC
  Administered 2016-04-21: 0.4 mg via INTRAVENOUS
  Filled 2016-04-21: qty 5

## 2016-04-21 MED ORDER — TECHNETIUM TC 99M TETROFOSMIN IV KIT
30.0000 | PACK | Freq: Once | INTRAVENOUS | Status: AC | PRN
  Administered 2016-04-21: 31 via INTRAVENOUS

## 2016-04-21 MED ORDER — SODIUM CHLORIDE 0.9% FLUSH
INTRAVENOUS | Status: AC
  Administered 2016-04-21: 10 mL via INTRAVENOUS
  Filled 2016-04-21: qty 10

## 2016-05-12 DIAGNOSIS — Z0000 Encounter for general adult medical examination without abnormal findings: Secondary | ICD-10-CM | POA: Diagnosis not present

## 2016-05-12 DIAGNOSIS — Z299 Encounter for prophylactic measures, unspecified: Secondary | ICD-10-CM | POA: Diagnosis not present

## 2016-05-12 DIAGNOSIS — Z79899 Other long term (current) drug therapy: Secondary | ICD-10-CM | POA: Diagnosis not present

## 2016-05-12 DIAGNOSIS — Z1389 Encounter for screening for other disorder: Secondary | ICD-10-CM | POA: Diagnosis not present

## 2016-05-12 DIAGNOSIS — Z125 Encounter for screening for malignant neoplasm of prostate: Secondary | ICD-10-CM | POA: Diagnosis not present

## 2016-05-12 DIAGNOSIS — Z1211 Encounter for screening for malignant neoplasm of colon: Secondary | ICD-10-CM | POA: Diagnosis not present

## 2016-05-12 DIAGNOSIS — R5383 Other fatigue: Secondary | ICD-10-CM | POA: Diagnosis not present

## 2016-05-12 DIAGNOSIS — Z7189 Other specified counseling: Secondary | ICD-10-CM | POA: Diagnosis not present

## 2016-05-15 DIAGNOSIS — I10 Essential (primary) hypertension: Secondary | ICD-10-CM | POA: Diagnosis not present

## 2016-05-20 DIAGNOSIS — L820 Inflamed seborrheic keratosis: Secondary | ICD-10-CM | POA: Diagnosis not present

## 2016-05-20 DIAGNOSIS — Z299 Encounter for prophylactic measures, unspecified: Secondary | ICD-10-CM | POA: Diagnosis not present

## 2016-06-03 DIAGNOSIS — I10 Essential (primary) hypertension: Secondary | ICD-10-CM | POA: Diagnosis not present

## 2016-06-30 DIAGNOSIS — M216X2 Other acquired deformities of left foot: Secondary | ICD-10-CM | POA: Diagnosis not present

## 2016-06-30 DIAGNOSIS — M79672 Pain in left foot: Secondary | ICD-10-CM | POA: Diagnosis not present

## 2016-06-30 DIAGNOSIS — M7742 Metatarsalgia, left foot: Secondary | ICD-10-CM | POA: Diagnosis not present

## 2016-07-14 DIAGNOSIS — I10 Essential (primary) hypertension: Secondary | ICD-10-CM | POA: Diagnosis not present

## 2016-08-10 DIAGNOSIS — N400 Enlarged prostate without lower urinary tract symptoms: Secondary | ICD-10-CM | POA: Diagnosis not present

## 2016-08-10 DIAGNOSIS — M109 Gout, unspecified: Secondary | ICD-10-CM | POA: Diagnosis not present

## 2016-08-10 DIAGNOSIS — Z299 Encounter for prophylactic measures, unspecified: Secondary | ICD-10-CM | POA: Diagnosis not present

## 2016-08-10 DIAGNOSIS — I10 Essential (primary) hypertension: Secondary | ICD-10-CM | POA: Diagnosis not present

## 2016-08-10 DIAGNOSIS — Z6836 Body mass index (BMI) 36.0-36.9, adult: Secondary | ICD-10-CM | POA: Diagnosis not present

## 2016-08-10 DIAGNOSIS — Z713 Dietary counseling and surveillance: Secondary | ICD-10-CM | POA: Diagnosis not present

## 2016-08-10 DIAGNOSIS — I429 Cardiomyopathy, unspecified: Secondary | ICD-10-CM | POA: Diagnosis not present

## 2016-09-09 DIAGNOSIS — I10 Essential (primary) hypertension: Secondary | ICD-10-CM | POA: Diagnosis not present

## 2016-10-08 DIAGNOSIS — I10 Essential (primary) hypertension: Secondary | ICD-10-CM | POA: Diagnosis not present

## 2016-12-03 ENCOUNTER — Other Ambulatory Visit: Payer: Self-pay | Admitting: Cardiovascular Disease

## 2016-12-14 DIAGNOSIS — Z6836 Body mass index (BMI) 36.0-36.9, adult: Secondary | ICD-10-CM | POA: Diagnosis not present

## 2016-12-14 DIAGNOSIS — N400 Enlarged prostate without lower urinary tract symptoms: Secondary | ICD-10-CM | POA: Diagnosis not present

## 2016-12-14 DIAGNOSIS — I10 Essential (primary) hypertension: Secondary | ICD-10-CM | POA: Diagnosis not present

## 2016-12-14 DIAGNOSIS — Z299 Encounter for prophylactic measures, unspecified: Secondary | ICD-10-CM | POA: Diagnosis not present

## 2016-12-14 DIAGNOSIS — I429 Cardiomyopathy, unspecified: Secondary | ICD-10-CM | POA: Diagnosis not present

## 2017-03-22 DIAGNOSIS — Z299 Encounter for prophylactic measures, unspecified: Secondary | ICD-10-CM | POA: Diagnosis not present

## 2017-03-22 DIAGNOSIS — I429 Cardiomyopathy, unspecified: Secondary | ICD-10-CM | POA: Diagnosis not present

## 2017-03-22 DIAGNOSIS — Z6836 Body mass index (BMI) 36.0-36.9, adult: Secondary | ICD-10-CM | POA: Diagnosis not present

## 2017-03-22 DIAGNOSIS — N400 Enlarged prostate without lower urinary tract symptoms: Secondary | ICD-10-CM | POA: Diagnosis not present

## 2017-03-22 DIAGNOSIS — I10 Essential (primary) hypertension: Secondary | ICD-10-CM | POA: Diagnosis not present

## 2017-03-25 DIAGNOSIS — J029 Acute pharyngitis, unspecified: Secondary | ICD-10-CM | POA: Diagnosis not present

## 2017-03-25 DIAGNOSIS — Z299 Encounter for prophylactic measures, unspecified: Secondary | ICD-10-CM | POA: Diagnosis not present

## 2017-03-25 DIAGNOSIS — I429 Cardiomyopathy, unspecified: Secondary | ICD-10-CM | POA: Diagnosis not present

## 2017-03-25 DIAGNOSIS — Z6836 Body mass index (BMI) 36.0-36.9, adult: Secondary | ICD-10-CM | POA: Diagnosis not present

## 2017-03-25 DIAGNOSIS — Z789 Other specified health status: Secondary | ICD-10-CM | POA: Diagnosis not present

## 2017-03-30 ENCOUNTER — Encounter: Payer: Self-pay | Admitting: *Deleted

## 2017-03-31 ENCOUNTER — Ambulatory Visit (INDEPENDENT_AMBULATORY_CARE_PROVIDER_SITE_OTHER): Payer: Medicare Other | Admitting: Cardiovascular Disease

## 2017-03-31 ENCOUNTER — Encounter (INDEPENDENT_AMBULATORY_CARE_PROVIDER_SITE_OTHER): Payer: Self-pay

## 2017-03-31 ENCOUNTER — Encounter: Payer: Self-pay | Admitting: Cardiovascular Disease

## 2017-03-31 ENCOUNTER — Other Ambulatory Visit: Payer: Self-pay

## 2017-03-31 VITALS — BP 128/72 | HR 63 | Ht 74.0 in | Wt 284.0 lb

## 2017-03-31 DIAGNOSIS — I429 Cardiomyopathy, unspecified: Secondary | ICD-10-CM

## 2017-03-31 DIAGNOSIS — I10 Essential (primary) hypertension: Secondary | ICD-10-CM

## 2017-03-31 NOTE — Patient Instructions (Signed)
Your physician wants you to follow-up in: 1 YEAR WITH DR KONESWARAN You will receive a reminder letter in the mail two months in advance. If you don't receive a letter, please call our office to schedule the follow-up appointment.  Your physician recommends that you continue on your current medications as directed. Please refer to the Current Medication list given to you today.  Thank you for choosing [facility]!!    

## 2017-03-31 NOTE — Progress Notes (Signed)
SUBJECTIVE: The patient presents for follow-up of chronic systolic heart failure.  Echocardiogram 04/09/16 showed mildly reduced left ventricular systolic function, LVEF 48%, mild LVH, grade 1 diastolic dysfunction, and mild hypokinesis of the mid inferolateral and apical lateral myocardium with mild aortic, mitral, and pulmonic regurgitation. There was moderate left atrial dilatation.  Nuclear stress test on 04/21/16 did not demonstrate any evidence of ischemia.  It was a low risk study.  There were small defects suggestive of scar versus soft tissue attenuation artifact.  ECG performed in the office today which I ordered and personally interpreted demonstrates normal sinus rhythm with no ischemic ST segment or T-wave abnormalities, nor any arrhythmias.  The patient denies any symptoms of chest pain, palpitations, shortness of breath, lightheadedness, dizziness, leg swelling, orthopnea, PND, and syncope.     Review of Systems: As per "subjective", otherwise negative.  No Known Allergies  Current Outpatient Medications  Medication Sig Dispense Refill  . allopurinol (ZYLOPRIM) 300 MG tablet Take 300 mg by mouth daily.    [name] amLODipine (NORVASC) 10 MG tablet TAKE ONE TABLET BY MOUTH DAILY. 90 tablet 3  . Calcium Carb-Cholecalciferol (CALCIUM + D3 PO) Take 1 tablet by mouth daily.    . carvedilol (COREG) 3.125 MG tablet Take 3.125 mg 2 (two) times daily with a meal by mouth.    . Cholecalciferol (VITAMIN D3) 2000 UNITS TABS Take 1 tablet by mouth daily.    [name] CINNAMON PO Take 350 mg by mouth daily.    . Coenzyme Q10 200 MG capsule Take 200 mg by mouth daily.    . Ferrous Fumarate (IRON) 18 MG TBCR Take 1 tablet by mouth daily.    . fish oil-omega-3 fatty acids 1000 MG capsule Take 830 mg by mouth daily.    . Glucosamine-Chondroitin (GLUCOSAMINE CHONDR COMPLEX PO) Take by mouth daily. 1500mg /1200mg     . LUTEIN-ZEAXANTHIN PO Take 4 mg by mouth daily.    . Multiple Vitamins-Minerals  (MULTIVITAMIN PO) Take 1 tablet by mouth daily.    [name] olmesartan (BENICAR) 20 MG tablet Take 20 mg daily by mouth.  4  . PHOSPHATIDYLCHOLINE PO Take 420 mg by mouth daily.    . Probiotic Product (PROBIOTIC DAILY PO) Take 1 tablet by mouth daily. 5 billion unit daily    . RESVERATROL 100 MG CAPS Take 1 capsule by mouth daily.    . tamsulosin (FLOMAX) 0.4 MG CAPS capsule Take 1 capsule by mouth daily.  5   No current facility-administered medications for this visit.     Past Medical History:  Diagnosis Date  . Arthritis   . Atrial fib/flutter, transient   . CAD (coronary artery disease)    Stent x 2  . Chronic back pain    Spinal stenosis  . Gout   . HTN (hypertension)   . Neuromuscular disorder (HCC)    numbness in toes  . Thyroid nodule    Benign    Past Surgical History:  Procedure Laterality Date  . CATARACT EXTRACTION    . COLONOSCOPY  2012  . IRRIGATION AND DEBRIDEMENT SEBACEOUS CYST    . PILONIDAL CYST DRAINAGE      Social History   Socioeconomic History  . Marital status: Married    Spouse name: Not on file  . Number of children: 3  . Years of education: Not on file  . Highest education level: Not on file  Social Needs  . Financial resource strain: Not on file  . Food insecurity -  worry: Not on file  . Food insecurity - inability: Not on file  . Transportation needs - medical: Not on file  . Transportation needs - non-medical: Not on file  Occupational History  . Not on file  Tobacco Use  . Smoking status: Never Smoker  . Smokeless tobacco: Never Used  Substance and Sexual Activity  . Alcohol use: No    Alcohol/week: 0.0 oz  . Drug use: No  . Sexual activity: Not on file  Other Topics Concern  . Not on file  Social History Narrative  . Not on file     Vitals:   03/31/17 0856  BP: 128/72  Pulse: 63  SpO2: 98%  Weight: 284 lb (128.8 kg)  Height: 6\' 2"  (1.88 m)    Wt Readings from Last 3 Encounters:  03/31/17 284 lb (128.8 kg)  04/20/16  291 lb 6.4 oz (132.2 kg)  01/07/16 289 lb (131.1 kg)     PHYSICAL EXAM General: NAD HEENT: Normal. Neck: No JVD, no thyromegaly. Lungs: Clear to auscultation bilaterally with normal respiratory effort. CV: Regular rate and rhythm, normal S1/S2, no S3/S4, no murmur. No pretibial or periankle edema.  No carotid bruit.   Abdomen: Soft, nontender, no distention.  Neurologic: Alert and oriented.  Psych: Normal affect. Skin: Normal. Musculoskeletal: No gross deformities.    ECG: Most recent ECG reviewed.   Labs: Lab Results  Component Value Date/Time   K 4.4 01/19/2013 09:14 AM   BUN 17 01/19/2013 09:14 AM   CREATININE 0.92 01/19/2013 09:14 AM   ALT 25 01/19/2013 09:14 AM   HGB 15.0 01/19/2013 09:14 AM     Lipids: No results found for: LDLCALC, LDLDIRECT, CHOL, TRIG, HDL     ASSESSMENT AND PLAN:  1. Cardiomyopathy:  Symptomatically stable.  This appears to be nonischemic in etiology. Continue Coreg and ARB.  2. Essential HTN: Controlled. No changes.     Disposition: Follow up 1 year   Kate Sable, M.D., F.A.C.C.

## 2017-04-30 DIAGNOSIS — H35371 Puckering of macula, right eye: Secondary | ICD-10-CM | POA: Diagnosis not present

## 2017-04-30 DIAGNOSIS — H4311 Vitreous hemorrhage, right eye: Secondary | ICD-10-CM | POA: Diagnosis not present

## 2017-04-30 DIAGNOSIS — H43813 Vitreous degeneration, bilateral: Secondary | ICD-10-CM | POA: Diagnosis not present

## 2017-05-06 DIAGNOSIS — I10 Essential (primary) hypertension: Secondary | ICD-10-CM | POA: Diagnosis not present

## 2017-05-14 DIAGNOSIS — Z1339 Encounter for screening examination for other mental health and behavioral disorders: Secondary | ICD-10-CM | POA: Diagnosis not present

## 2017-05-14 DIAGNOSIS — Z79899 Other long term (current) drug therapy: Secondary | ICD-10-CM | POA: Diagnosis not present

## 2017-05-14 DIAGNOSIS — Z1211 Encounter for screening for malignant neoplasm of colon: Secondary | ICD-10-CM | POA: Diagnosis not present

## 2017-05-14 DIAGNOSIS — Z7189 Other specified counseling: Secondary | ICD-10-CM | POA: Diagnosis not present

## 2017-05-14 DIAGNOSIS — I10 Essential (primary) hypertension: Secondary | ICD-10-CM | POA: Diagnosis not present

## 2017-05-14 DIAGNOSIS — Z6836 Body mass index (BMI) 36.0-36.9, adult: Secondary | ICD-10-CM | POA: Diagnosis not present

## 2017-05-14 DIAGNOSIS — Z299 Encounter for prophylactic measures, unspecified: Secondary | ICD-10-CM | POA: Diagnosis not present

## 2017-05-14 DIAGNOSIS — Z0000 Encounter for general adult medical examination without abnormal findings: Secondary | ICD-10-CM | POA: Diagnosis not present

## 2017-05-14 DIAGNOSIS — Z125 Encounter for screening for malignant neoplasm of prostate: Secondary | ICD-10-CM | POA: Diagnosis not present

## 2017-05-14 DIAGNOSIS — R5383 Other fatigue: Secondary | ICD-10-CM | POA: Diagnosis not present

## 2017-05-14 DIAGNOSIS — Z1331 Encounter for screening for depression: Secondary | ICD-10-CM | POA: Diagnosis not present

## 2017-05-26 DIAGNOSIS — H4311 Vitreous hemorrhage, right eye: Secondary | ICD-10-CM | POA: Diagnosis not present

## 2017-05-26 DIAGNOSIS — H35371 Puckering of macula, right eye: Secondary | ICD-10-CM | POA: Diagnosis not present

## 2017-05-26 DIAGNOSIS — H43813 Vitreous degeneration, bilateral: Secondary | ICD-10-CM | POA: Diagnosis not present

## 2017-07-30 DIAGNOSIS — I10 Essential (primary) hypertension: Secondary | ICD-10-CM | POA: Diagnosis not present

## 2017-08-13 DIAGNOSIS — M109 Gout, unspecified: Secondary | ICD-10-CM | POA: Diagnosis not present

## 2017-08-13 DIAGNOSIS — Z299 Encounter for prophylactic measures, unspecified: Secondary | ICD-10-CM | POA: Diagnosis not present

## 2017-08-13 DIAGNOSIS — I10 Essential (primary) hypertension: Secondary | ICD-10-CM | POA: Diagnosis not present

## 2017-08-13 DIAGNOSIS — I429 Cardiomyopathy, unspecified: Secondary | ICD-10-CM | POA: Diagnosis not present

## 2017-08-13 DIAGNOSIS — Z6836 Body mass index (BMI) 36.0-36.9, adult: Secondary | ICD-10-CM | POA: Diagnosis not present

## 2017-09-01 DIAGNOSIS — I10 Essential (primary) hypertension: Secondary | ICD-10-CM | POA: Diagnosis not present

## 2017-09-02 DIAGNOSIS — I10 Essential (primary) hypertension: Secondary | ICD-10-CM | POA: Diagnosis not present

## 2017-09-02 DIAGNOSIS — Z299 Encounter for prophylactic measures, unspecified: Secondary | ICD-10-CM | POA: Diagnosis not present

## 2017-09-02 DIAGNOSIS — Z6835 Body mass index (BMI) 35.0-35.9, adult: Secondary | ICD-10-CM | POA: Diagnosis not present

## 2017-09-02 DIAGNOSIS — D224 Melanocytic nevi of scalp and neck: Secondary | ICD-10-CM | POA: Diagnosis not present

## 2017-09-02 DIAGNOSIS — D234 Other benign neoplasm of skin of scalp and neck: Secondary | ICD-10-CM | POA: Diagnosis not present

## 2017-09-06 ENCOUNTER — Other Ambulatory Visit: Payer: Self-pay | Admitting: Cardiovascular Disease

## 2017-09-27 DIAGNOSIS — J329 Chronic sinusitis, unspecified: Secondary | ICD-10-CM | POA: Diagnosis not present

## 2017-09-27 DIAGNOSIS — Z6835 Body mass index (BMI) 35.0-35.9, adult: Secondary | ICD-10-CM | POA: Diagnosis not present

## 2017-09-27 DIAGNOSIS — Z299 Encounter for prophylactic measures, unspecified: Secondary | ICD-10-CM | POA: Diagnosis not present

## 2017-09-27 DIAGNOSIS — Z789 Other specified health status: Secondary | ICD-10-CM | POA: Diagnosis not present

## 2017-09-27 DIAGNOSIS — Z713 Dietary counseling and surveillance: Secondary | ICD-10-CM | POA: Diagnosis not present

## 2017-10-05 DIAGNOSIS — T148XXA Other injury of unspecified body region, initial encounter: Secondary | ICD-10-CM | POA: Diagnosis not present

## 2017-10-05 DIAGNOSIS — D239 Other benign neoplasm of skin, unspecified: Secondary | ICD-10-CM | POA: Diagnosis not present

## 2017-10-05 DIAGNOSIS — Z6835 Body mass index (BMI) 35.0-35.9, adult: Secondary | ICD-10-CM | POA: Diagnosis not present

## 2017-10-05 DIAGNOSIS — D224 Melanocytic nevi of scalp and neck: Secondary | ICD-10-CM | POA: Diagnosis not present

## 2017-10-05 DIAGNOSIS — Z299 Encounter for prophylactic measures, unspecified: Secondary | ICD-10-CM | POA: Diagnosis not present

## 2017-10-11 DIAGNOSIS — I10 Essential (primary) hypertension: Secondary | ICD-10-CM | POA: Diagnosis not present

## 2017-11-22 DIAGNOSIS — J189 Pneumonia, unspecified organism: Secondary | ICD-10-CM

## 2017-11-22 HISTORY — DX: Pneumonia, unspecified organism: J18.9

## 2017-12-02 DIAGNOSIS — M79672 Pain in left foot: Secondary | ICD-10-CM | POA: Diagnosis not present

## 2017-12-02 DIAGNOSIS — M7742 Metatarsalgia, left foot: Secondary | ICD-10-CM | POA: Diagnosis not present

## 2017-12-11 ENCOUNTER — Inpatient Hospital Stay (HOSPITAL_COMMUNITY)
Admission: EM | Admit: 2017-12-11 | Discharge: 2017-12-14 | DRG: 309 | Disposition: A | Discharge status: Home / Self Care | Payer: Medicare Other | Attending: Internal Medicine | Admitting: Internal Medicine

## 2017-12-11 ENCOUNTER — Emergency Department (HOSPITAL_COMMUNITY): Payer: Medicare Other

## 2017-12-11 ENCOUNTER — Other Ambulatory Visit: Payer: Self-pay

## 2017-12-11 ENCOUNTER — Encounter (HOSPITAL_COMMUNITY): Payer: Self-pay

## 2017-12-11 DIAGNOSIS — E669 Obesity, unspecified: Secondary | ICD-10-CM | POA: Diagnosis present

## 2017-12-11 DIAGNOSIS — I4891 Unspecified atrial fibrillation: Secondary | ICD-10-CM | POA: Diagnosis present

## 2017-12-11 DIAGNOSIS — M1A9XX0 Chronic gout, unspecified, without tophus (tophi): Secondary | ICD-10-CM

## 2017-12-11 DIAGNOSIS — M549 Dorsalgia, unspecified: Secondary | ICD-10-CM | POA: Diagnosis present

## 2017-12-11 DIAGNOSIS — I5022 Chronic systolic (congestive) heart failure: Secondary | ICD-10-CM | POA: Diagnosis not present

## 2017-12-11 DIAGNOSIS — Z801 Family history of malignant neoplasm of trachea, bronchus and lung: Secondary | ICD-10-CM

## 2017-12-11 DIAGNOSIS — I2510 Atherosclerotic heart disease of native coronary artery without angina pectoris: Secondary | ICD-10-CM | POA: Diagnosis not present

## 2017-12-11 DIAGNOSIS — I428 Other cardiomyopathies: Secondary | ICD-10-CM | POA: Diagnosis present

## 2017-12-11 DIAGNOSIS — N400 Enlarged prostate without lower urinary tract symptoms: Secondary | ICD-10-CM | POA: Diagnosis present

## 2017-12-11 DIAGNOSIS — I10 Essential (primary) hypertension: Secondary | ICD-10-CM | POA: Diagnosis not present

## 2017-12-11 DIAGNOSIS — Z9849 Cataract extraction status, unspecified eye: Secondary | ICD-10-CM

## 2017-12-11 DIAGNOSIS — E041 Nontoxic single thyroid nodule: Secondary | ICD-10-CM | POA: Diagnosis present

## 2017-12-11 DIAGNOSIS — M4800 Spinal stenosis, site unspecified: Secondary | ICD-10-CM | POA: Diagnosis present

## 2017-12-11 DIAGNOSIS — D696 Thrombocytopenia, unspecified: Secondary | ICD-10-CM | POA: Diagnosis present

## 2017-12-11 DIAGNOSIS — G629 Polyneuropathy, unspecified: Secondary | ICD-10-CM | POA: Diagnosis present

## 2017-12-11 DIAGNOSIS — I481 Persistent atrial fibrillation: Secondary | ICD-10-CM | POA: Diagnosis present

## 2017-12-11 DIAGNOSIS — I480 Paroxysmal atrial fibrillation: Secondary | ICD-10-CM | POA: Diagnosis not present

## 2017-12-11 DIAGNOSIS — G8929 Other chronic pain: Secondary | ICD-10-CM | POA: Diagnosis present

## 2017-12-11 DIAGNOSIS — M1990 Unspecified osteoarthritis, unspecified site: Secondary | ICD-10-CM | POA: Diagnosis present

## 2017-12-11 DIAGNOSIS — I110 Hypertensive heart disease with heart failure: Secondary | ICD-10-CM | POA: Diagnosis present

## 2017-12-11 DIAGNOSIS — Z800 Family history of malignant neoplasm of digestive organs: Secondary | ICD-10-CM

## 2017-12-11 DIAGNOSIS — M109 Gout, unspecified: Secondary | ICD-10-CM | POA: Diagnosis present

## 2017-12-11 DIAGNOSIS — Z79899 Other long term (current) drug therapy: Secondary | ICD-10-CM

## 2017-12-11 DIAGNOSIS — Z6834 Body mass index (BMI) 34.0-34.9, adult: Secondary | ICD-10-CM

## 2017-12-11 HISTORY — DX: Polyneuropathy, unspecified: G62.9

## 2017-12-11 HISTORY — DX: Essential (primary) hypertension: I10

## 2017-12-11 HISTORY — DX: Benign prostatic hyperplasia without lower urinary tract symptoms: N40.0

## 2017-12-11 LAB — URINALYSIS, ROUTINE W REFLEX MICROSCOPIC
Bilirubin Urine: NEGATIVE
Glucose, UA: NEGATIVE mg/dL
HGB URINE DIPSTICK: NEGATIVE
Ketones, ur: NEGATIVE mg/dL
Leukocytes, UA: NEGATIVE
Nitrite: NEGATIVE
PROTEIN: NEGATIVE mg/dL
SPECIFIC GRAVITY, URINE: 1.009 (ref 1.005–1.030)
pH: 8 (ref 5.0–8.0)

## 2017-12-11 LAB — APTT: APTT: 27 s (ref 24–36)

## 2017-12-11 LAB — MAGNESIUM: MAGNESIUM: 2.2 mg/dL (ref 1.7–2.4)

## 2017-12-11 LAB — TSH: TSH: 2.155 u[IU]/mL (ref 0.350–4.500)

## 2017-12-11 LAB — PROTIME-INR
INR: 1.08
PROTHROMBIN TIME: 13.9 s (ref 11.4–15.2)

## 2017-12-11 LAB — MRSA PCR SCREENING: MRSA by PCR: NEGATIVE

## 2017-12-11 LAB — BASIC METABOLIC PANEL
Anion gap: 7 (ref 5–15)
BUN: 16 mg/dL (ref 8–23)
CHLORIDE: 108 mmol/L (ref 98–111)
CO2: 26 mmol/L (ref 22–32)
Calcium: 8.7 mg/dL — ABNORMAL LOW (ref 8.9–10.3)
Creatinine, Ser: 0.95 mg/dL (ref 0.61–1.24)
GFR calc Af Amer: >60 mL/min (ref >60)
GFR calc non Af Amer: >60 mL/min (ref >60)
Glucose, Bld: 116 mg/dL — ABNORMAL HIGH (ref 70–99)
Potassium: 4.1 mmol/L (ref 3.5–5.1)
SODIUM: 141 mmol/L (ref 135–145)

## 2017-12-11 LAB — CBC
HEMATOCRIT: 41.3 % (ref 39.0–52.0)
Hemoglobin: 14 g/dL (ref 13.0–17.0)
MCH: 30.2 pg (ref 26.0–34.0)
MCHC: 33.9 g/dL (ref 30.0–36.0)
MCV: 89 fL (ref 78.0–100.0)
Platelets: 127 10*3/uL — ABNORMAL LOW (ref 150–400)
RBC: 4.64 MIL/uL (ref 4.22–5.81)
RDW: 13.6 % (ref 11.5–15.5)
WBC: 6.8 10*3/uL (ref 4.0–10.5)

## 2017-12-11 LAB — TROPONIN I: Troponin I: <0.03 ng/mL (ref <0.03)

## 2017-12-11 LAB — HEPARIN LEVEL (UNFRACTIONATED): HEPARIN UNFRACTIONATED: 0.54 [IU]/mL (ref 0.30–0.70)

## 2017-12-11 LAB — CBG MONITORING, ED: GLUCOSE-CAPILLARY: 108 mg/dL — AB (ref 70–99)

## 2017-12-11 LAB — BRAIN NATRIURETIC PEPTIDE: B NATRIURETIC PEPTIDE 5: 189 pg/mL — AB (ref 0.0–100.0)

## 2017-12-11 MED ORDER — DILTIAZEM LOAD VIA INFUSION
20.0000 mg | Freq: Once | INTRAVENOUS | Status: AC
  Administered 2017-12-11: 20 mg via INTRAVENOUS
  Filled 2017-12-11: qty 20

## 2017-12-11 MED ORDER — SODIUM CHLORIDE 0.9% FLUSH
3.0000 mL | Freq: Two times a day (BID) | INTRAVENOUS | Status: DC
  Administered 2017-12-11 – 2017-12-13 (×4): 3 mL via INTRAVENOUS

## 2017-12-11 MED ORDER — CARVEDILOL 12.5 MG PO TABS
6.2500 mg | ORAL_TABLET | Freq: Two times a day (BID) | ORAL | Status: DC
  Administered 2017-12-11 – 2017-12-13 (×4): 6.25 mg via ORAL
  Filled 2017-12-11 (×3): qty 1

## 2017-12-11 MED ORDER — ACETAMINOPHEN 325 MG PO TABS: 650.0000 mg | ORAL_TABLET | ORAL | Status: DC | PRN

## 2017-12-11 MED ORDER — HEPARIN BOLUS VIA INFUSION
4000.0000 [IU] | Freq: Once | INTRAVENOUS | Status: AC
  Administered 2017-12-11: 4000 [IU] via INTRAVENOUS

## 2017-12-11 MED ORDER — SODIUM CHLORIDE 0.9 % IV SOLN
250.0000 mL | INTRAVENOUS | Status: DC | PRN
  Administered 2017-12-13: 250 mL via INTRAVENOUS

## 2017-12-11 MED ORDER — DILTIAZEM HCL-DEXTROSE 100-5 MG/100ML-% IV SOLN (PREMIX)
5.0000 mg/h | INTRAVENOUS | Status: DC
  Administered 2017-12-11: 7.5 mg/h via INTRAVENOUS
  Administered 2017-12-11 – 2017-12-13 (×3): 5 mg/h via INTRAVENOUS
  Administered 2017-12-13: 15 mg/h via INTRAVENOUS
  Filled 2017-12-11 (×4): qty 100

## 2017-12-11 MED ORDER — HEPARIN (PORCINE) IN NACL 100-0.45 UNIT/ML-% IJ SOLN
1650.0000 [IU]/h | INTRAMUSCULAR | Status: DC
  Administered 2017-12-11 – 2017-12-12 (×2): 1650 [IU]/h via INTRAVENOUS
  Filled 2017-12-11 (×2): qty 250

## 2017-12-11 MED ORDER — ONDANSETRON HCL 4 MG/2ML IJ SOLN: 4.0000 mg | Freq: Four times a day (QID) | INTRAMUSCULAR | Status: DC | PRN

## 2017-12-11 MED ORDER — ALLOPURINOL 300 MG PO TABS
300.0000 mg | ORAL_TABLET | Freq: Every day | ORAL | Status: DC
  Administered 2017-12-11 – 2017-12-13 (×3): 300 mg via ORAL
  Filled 2017-12-11 (×3): qty 1

## 2017-12-11 MED ORDER — TAMSULOSIN HCL 0.4 MG PO CAPS
0.4000 mg | ORAL_CAPSULE | Freq: Every day | ORAL | Status: DC
  Administered 2017-12-12 – 2017-12-13 (×2): 0.4 mg via ORAL
  Filled 2017-12-11 (×2): qty 1

## 2017-12-11 MED ORDER — SODIUM CHLORIDE 0.9% FLUSH: 3.0000 mL | INTRAVENOUS | Status: DC | PRN

## 2017-12-11 NOTE — ED Triage Notes (Signed)
Patient complains of short of breath beginning today, worsens when moving or walking.  No use of oxygen at home.  Patient denies chest pain, or headache, does states feeling light headed.

## 2017-12-11 NOTE — Progress Notes (Signed)
ANTICOAGULATION CONSULT NOTE - Initial Consult  Pharmacy Consult for heparin gtt  Indication: atrial fibrillation  No Known Allergies  Patient Measurements: Height: 6\' 2"  (188 cm) Weight: 266 lb 8.6 oz (120.9 kg) IBW/kg (Calculated) : 82.2 Heparin Dosing Weight: 109.5kg  Vital Signs: Temp: 98.4 F (36.9 C) (07/20 2007) Temp Source: Oral (07/20 2007) BP: 119/70 (07/20 1727) Pulse Rate: 46 (07/20 1800)  Labs: Recent Labs    12/11/17 1320 12/11/17 1452 12/11/17 1803 12/11/17 2203  HGB 14.0  --   --   --   HCT 41.3  --   --   --   PLT 127*  --   --   --   APTT  --  27  --   --   LABPROT  --  13.9  --   --   INR  --  1.08  --   --   HEPARINUNFRC  --   --   --  0.54  CREATININE 0.95  --   --   --   TROPONINI  --   --  <0.03  --     Estimated Creatinine Clearance: 102.8 mL/min (by C-G formula based on SCr of 0.95 mg/dL).   Medical History: Past Medical History:  Diagnosis Date  . Arthritis   . Atrial fib/flutter, transient   . BPH (benign prostatic hyperplasia) 12/11/2017  . CAD (coronary artery disease)    Stent x 2  . Chronic back pain    Spinal stenosis  . Gout   . HTN (hypertension)   . Neuromuscular disorder (HCC)    numbness in toes  . Thyroid nodule    Benign    Medications:  Medications Prior to Admission  Medication Sig Dispense Refill Last Dose  . allopurinol (ZYLOPRIM) 300 MG tablet Take 300 mg by mouth daily.   Past Week at Unknown time  . amLODipine (NORVASC) 10 MG tablet TAKE ONE TABLET BY MOUTH DAILY. 90 tablet 3 Past Week at Unknown time  . Calcium Carb-Cholecalciferol (CALCIUM + D3 PO) Take 1 tablet by mouth daily.   Past Week at Unknown time  . carvedilol (COREG) 3.125 MG tablet Take 3.125 mg 2 (two) times daily with a meal by mouth.   Past Week at Unknown time  . Cholecalciferol (VITAMIN D3) 2000 UNITS TABS Take 1 tablet by mouth daily.   Past Week at Unknown time  . CINNAMON PO Take 350 mg by mouth daily.   Past Week at Unknown time  .  Coenzyme Q10 200 MG capsule Take 200 mg by mouth daily.   Past Week at Unknown time  . Ferrous Fumarate (IRON) 18 MG TBCR Take 1 tablet by mouth daily.   Past Week at Unknown time  . fish oil-omega-3 fatty acids 1000 MG capsule Take 830 mg by mouth daily.   Past Week at Unknown time  . Glucosamine-Chondroitin (GLUCOSAMINE CHONDR COMPLEX PO) Take by mouth daily. 1500mg /1200mg    Past Week at Unknown time  . LUTEIN-ZEAXANTHIN PO Take 4 mg by mouth daily.   Past Week at Unknown time  . Multiple Vitamins-Minerals (MULTIVITAMIN PO) Take 1 tablet by mouth daily.   12/11/2017 at Unknown time  . olmesartan (BENICAR) 20 MG tablet Take 20 mg daily by mouth.  4 12/11/2017 at Unknown time  . PHOSPHATIDYLCHOLINE PO Take 420 mg by mouth daily.   Past Week at Unknown time  . Probiotic Product (PROBIOTIC DAILY PO) Take 1 tablet by mouth daily. 5 billion unit daily   Past  Week at Unknown time  . RESVERATROL 100 MG CAPS Take 1 capsule by mouth daily.   Past Week at Unknown time  . tamsulosin (FLOMAX) 0.4 MG CAPS capsule Take 1 capsule by mouth daily.  5 12/11/2017 at Unknown time   Scheduled:  . allopurinol  300 mg Oral Daily  . carvedilol  6.25 mg Oral BID WC  . sodium chloride flush  3 mL Intravenous Q12H  . [START ON 12/12/2017] tamsulosin  0.4 mg Oral Daily   Infusions:  . sodium chloride    . diltiazem (CARDIZEM) infusion 7.5 mg/hr (12/11/17 2045)  . heparin 1,650 Units/hr (12/11/17 1630)   PRN:  Anti-infectives (From admission, onward)   None      Assessment: Morbid obese 67 year old man requiring heparin gtt for aflutter per physician per pharmacy protocol     Goal of Therapy:  Heparin level 0.3-0.7 units/ml Monitor platelets by anticoagulation protocol: Yes   Plan:  Give 4000 units bolus x 1 Start heparin infusion at 1650 units/hr Check anti-Xa level in 6 hours and daily while on heparin Continue to monitor H&H and platelets HL 0.54, therapeutic, continue heparin 1650 units/hr will recheck  with AM labs  [name] 12/11/2017,11:01 PM

## 2017-12-11 NOTE — H&P (Signed)
History and Physical    MERCURY ROCK MEQ:[id number] DOB: 1951-01-03 DOA: 12/11/2017  PCP: Glenda Chroman, MD  Patient coming from: home  I have personally briefly reviewed patient's old medical records in [place]  Chief Complaint: palpitations, fatigue  HPI: [name] is a 67 y.o. male with medical history significant of cardiomyopathy, hypertension, was in his usual state of health until this morning when he developed acute onset of palpitations and general fatigue. Patient reported that he felt "run down" and had some shortness of breath. He did not have any chest pain. Since he did not feel well, his wife checked his blood pressure and it was reportedly lower than his normal. She also noted an irregular pulse. He has not had any fever lately. He has had some post nasal drip. No cough, vomiting or diarrhea. No new medications lately.   ED Course: he was noted to be in rapid atrial fibrillation with a heart rate in the 130s-140s. Blood pressure was stable. Overall basic labs unrevealing. TSH normal. Chest xray with NAD. He was started on diltiazem infusion and referred for admission  Review of Systems: As per HPI otherwise 10 point review of systems negative.    Past Medical History:  Diagnosis Date  . Arthritis   . Atrial fib/flutter, transient   . BPH (benign prostatic hyperplasia) 12/11/2017  . CAD (coronary artery disease)    Stent x 2  . Chronic back pain    Spinal stenosis  . Gout   . HTN (hypertension)   . Neuromuscular disorder (HCC)    numbness in toes  . Thyroid nodule    Benign    Past Surgical History:  Procedure Laterality Date  . CATARACT EXTRACTION    . COLONOSCOPY  2012  . IRRIGATION AND DEBRIDEMENT SEBACEOUS CYST    . LUMBAR LAMINECTOMY N/A 01/20/2013   Procedure: MICRODISCECTOMY LUMBAR LAMINECTOMY;  Surgeon: Marybelle Killings, MD;  Location: [place];  Service: Orthopedics;  Laterality: N/A;  L4-5 Decompression  . PILONIDAL CYST DRAINAGE        reports that he has never smoked. He has never used smokeless tobacco. He reports that he does not drink alcohol or use drugs.  No Known Allergies  Family History  Problem Relation Age of Onset  . Cancer Father        Lung  . Lung cancer Father   . Atrial fibrillation Mother   . Dementia Mother   . Transient ischemic attack Mother   . Hypertension Unknown   . Cancer Brother        Liver    Prior to Admission medications   Medication Sig Start Date End Date Taking? Authorizing Provider  allopurinol (ZYLOPRIM) 300 MG tablet Take 300 mg by mouth daily.   Yes [provider]  amLODipine (NORVASC) 10 MG tablet TAKE ONE TABLET BY MOUTH DAILY. 09/06/17  Yes Herminio Commons, MD  Calcium Carb-Cholecalciferol (CALCIUM + D3 PO) Take 1 tablet by mouth daily.   Yes [provider]  carvedilol (COREG) 3.125 MG tablet Take 3.125 mg 2 (two) times daily with a meal by mouth.   Yes [provider]  Cholecalciferol (VITAMIN D3) 2000 UNITS TABS Take 1 tablet by mouth daily.   Yes [provider]  CINNAMON PO Take 350 mg by mouth daily.   Yes [provider]  Coenzyme Q10 200 MG capsule Take 200 mg by mouth daily.   Yes [provider]  Ferrous Fumarate (IRON)  18 MG TBCR Take 1 tablet by mouth daily.   Yes [provider]  fish oil-omega-3 fatty acids 1000 MG capsule Take 830 mg by mouth daily.   Yes [provider]  Glucosamine-Chondroitin (GLUCOSAMINE CHONDR COMPLEX PO) Take by mouth daily. 1500mg /1200mg    Yes [provider]  LUTEIN-ZEAXANTHIN PO Take 4 mg by mouth daily.   Yes [provider]  Multiple Vitamins-Minerals (MULTIVITAMIN PO) Take 1 tablet by mouth daily.   Yes [provider]  olmesartan (BENICAR) 20 MG tablet Take 20 mg daily by mouth. 03/29/17  Yes [provider]  PHOSPHATIDYLCHOLINE PO Take 420 mg by mouth daily.   Yes [provider]  Probiotic Product  (PROBIOTIC DAILY PO) Take 1 tablet by mouth daily. 5 billion unit daily   Yes [provider]  RESVERATROL 100 MG CAPS Take 1 capsule by mouth daily.   Yes [provider]  tamsulosin (FLOMAX) 0.4 MG CAPS capsule Take 1 capsule by mouth daily. 08/06/15  Yes [provider]    Physical Exam: Vitals:   12/11/17 1430 12/11/17 1500 12/11/17 1515 12/11/17 1530  BP: 138/70 114/63 109/72 (!) 154/76  Pulse: (!) 107 77  93  Resp: 17 17 16 17   Temp:      TempSrc:      SpO2: 98% 97% 95% 96%  Weight:      Height:        Constitutional: NAD, calm, comfortable Vitals:   12/11/17 1430 12/11/17 1500 12/11/17 1515 12/11/17 1530  BP: 138/70 114/63 109/72 (!) 154/76  Pulse: (!) 107 77  93  Resp: 17 17 16 17   Temp:      TempSrc:      SpO2: 98% 97% 95% 96%  Weight:      Height:       Eyes: PERRL, lids and conjunctivae normal ENMT: Mucous membranes are moist. Posterior pharynx clear of any exudate or lesions.Normal dentition.  Neck: normal, supple, no masses, no thyromegaly Respiratory: clear to auscultation bilaterally, no wheezing, no crackles. Normal respiratory effort. No accessory muscle use.  Cardiovascular: irregular rate and rhythm, no murmurs / rubs / gallops. No extremity edema. 2+ pedal pulses. No carotid bruits.  Abdomen: no tenderness, no masses palpated. No hepatosplenomegaly. Bowel sounds positive.  Musculoskeletal: no clubbing / cyanosis. No joint deformity upper and lower extremities. Good ROM, no contractures. Normal muscle tone.  Skin: no rashes, lesions, ulcers. No induration Neurologic: CN 2-12 grossly intact. Sensation intact, DTR normal. Strength 5/5 in all 4.  Psychiatric: Normal judgment and insight. Alert and oriented x 3. Normal mood.    Labs on Admission: I have personally reviewed following labs and imaging studies  CBC: Recent Labs  Lab 12/11/17 1320  WBC 6.8  HGB 14.0  HCT 41.3  MCV 89.0  PLT 784*   Basic Metabolic  Panel: Recent Labs  Lab 12/11/17 1320  NA 141  K 4.1  CL 108  CO2 26  GLUCOSE 116*  BUN 16  CREATININE 0.95  CALCIUM 8.7*  MG 2.2   GFR: Estimated Creatinine Clearance: 104.6 mL/min (by C-G formula based on SCr of 0.95 mg/dL). Liver Function Tests: No results for input(s): AST, ALT, ALKPHOS, BILITOT, PROT, ALBUMIN in the last 168 hours. No results for input(s): LIPASE, AMYLASE in the last 168 hours. No results for input(s): AMMONIA in the last 168 hours. Coagulation Profile: Recent Labs  Lab 12/11/17 1452  INR 1.08   Cardiac Enzymes: No results for input(s): CKTOTAL, CKMB, CKMBINDEX, TROPONINI  in the last 168 hours. BNP (last 3 results) No results for input(s): PROBNP in the last 8760 hours. HbA1C: No results for input(s): HGBA1C in the last 72 hours. CBG: Recent Labs  Lab 12/11/17 1247  GLUCAP 108*   Lipid Profile: No results for input(s): CHOL, HDL, LDLCALC, TRIG, CHOLHDL, LDLDIRECT in the last 72 hours. Thyroid Function Tests: Recent Labs    12/11/17 1320  TSH 2.155   Anemia Panel: No results for input(s): VITAMINB12, FOLATE, FERRITIN, TIBC, IRON, RETICCTPCT in the last 72 hours. Urine analysis:    Component Value Date/Time   COLORURINE YELLOW 12/11/2017 1320   APPEARANCEUR CLEAR 12/11/2017 1320   LABSPEC 1.009 12/11/2017 1320   PHURINE 8.0 12/11/2017 1320   GLUCOSEU NEGATIVE 12/11/2017 1320   HGBUR NEGATIVE 12/11/2017 1320   BILIRUBINUR NEGATIVE 12/11/2017 1320   KETONESUR NEGATIVE 12/11/2017 1320   PROTEINUR NEGATIVE 12/11/2017 1320   NITRITE NEGATIVE 12/11/2017 1320   LEUKOCYTESUR NEGATIVE 12/11/2017 1320    Radiological Exams on Admission: Dg Chest 2 View  Result Date: 12/11/2017 CLINICAL DATA:  A-fib that started today, SOB EXAM: CHEST - 2 VIEW COMPARISON:  None. FINDINGS: Normal mediastinum and cardiac silhouette. Normal pulmonary vasculature. No evidence of effusion, infiltrate, or pneumothorax. No acute bony abnormality. IMPRESSION: No  acute cardiopulmonary process. Electronically Signed   By: Suzy Bouchard M.D.   On: 12/11/2017 13:58    EKG: Independently reviewed. Atrial fibrillation with RVR  Assessment/Plan Active Problems:   Atrial fibrillation with RVR (HCC)   Chronic systolic CHF (congestive heart failure) (HCC)   HTN (hypertension)   Gout   BPH (benign prostatic hyperplasia)     1. Atrial fibrillation with RVR. Appears to be new onset. Unclear as to how long this has been present. CHADSVASc of 4. Patient has been started on heparin. Will eventually transition to Daisy on discharge. Heart rate control with IV diltiazem for now. Will increase coreg dosing to help control heart rate. Hold ARB and norvasc for now to allow for better heart rate control without significantly dropping blood pressure. Check echo and troponins. TSH normal 2. Chronic systolic CHF. Appears compensated. Continue to monitor 3. HTN. Currently stable. Continue to monitor on rate control medications 4. BPH. Continue flomax 5. Gout. No signs of acute flare. Continue allopurinol  DVT prophylaxis: heparin infusion  Code Status: full code  Family Communication: discussed with wife at the bedside  Disposition Plan: discharge home once heart rate stable  Consults called:   Admission status: observation, stepdown   Kathie Dike MD [facility] Pager [phone]  If 7PM-7AM, please contact night-coverage [email or web address] Password [email or web address]  12/11/2017, 4:18 PM

## 2017-12-11 NOTE — ED Provider Notes (Addendum)
[facility] EMERGENCY DEPARTMENT Provider Note   CSN: [id number] Arrival date & time: 12/11/17  1218     History   Chief Complaint Chief Complaint  Patient presents with  . Palpitations    HPI [name] is a 67 y.o. male.  HPI  67 year old male with history of CHF, CAD, questionable A. fib flutter history comes in with chief complaint of palpitation and weakness.  Patient states that he woke up this morning feeling tired, and noted exertional dyspnea.  Patient denies any chest pain or palpitations, but when her wife checked his heart rate and blood pressure, she noted that patient was having irregular heartbeats and his blood pressure was lower than usual.  Review of system is also positive for dizziness without any near fainting.  Patient denies having any palpitations or similar symptoms in the past.  Although his records mention transient A. fib, patient denies being aware of this condition at any point.  There is no new medication, and patient is compliant.  Past Medical History:  Diagnosis Date  . Arthritis   . Atrial fib/flutter, transient   . CAD (coronary artery disease)    Stent x 2  . Chronic back pain    Spinal stenosis  . Gout   . HTN (hypertension)   . Neuromuscular disorder (HCC)    numbness in toes  . Thyroid nodule    Benign    Patient Active Problem List   Diagnosis Date Noted  . 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 Numbness 10/31/2015  . Restless leg 10/31/2015  . Polyneuropathy 10/31/2015  . Anemia, iron deficiency 10/31/2015  . HNP (herniated nucleus pulposus), lumbar 01/20/2013    Class: Diagnosis of  . Abnormal echocardiogram 02/23/2012    Past Surgical History:  Procedure Laterality Date  . CATARACT EXTRACTION    . COLONOSCOPY  2012  . IRRIGATION AND DEBRIDEMENT SEBACEOUS CYST    . LUMBAR LAMINECTOMY N/A 01/20/2013   Procedure: MICRODISCECTOMY LUMBAR LAMINECTOMY;  Surgeon: Marybelle Killings, MD;  Location: [place];  Service:  Orthopedics;  Laterality: N/A;  L4-5 Decompression  . PILONIDAL CYST DRAINAGE          Home Medications    Prior to Admission medications   Medication Sig Start Date End Date Taking? Authorizing Provider  allopurinol (ZYLOPRIM) 300 MG tablet Take 300 mg by mouth daily.   Yes [provider]  amLODipine (NORVASC) 10 MG tablet TAKE ONE TABLET BY MOUTH DAILY. 09/06/17  Yes Herminio Commons, MD  Calcium Carb-Cholecalciferol (CALCIUM + D3 PO) Take 1 tablet by mouth daily.   Yes [provider]  carvedilol (COREG) 3.125 MG tablet Take 3.125 mg 2 (two) times daily with a meal by mouth.   Yes [provider]  Cholecalciferol (VITAMIN D3) 2000 UNITS TABS Take 1 tablet by mouth daily.   Yes [provider]  CINNAMON PO Take 350 mg by mouth daily.   Yes [provider]  Coenzyme Q10 200 MG capsule Take 200 mg by mouth daily.   Yes [provider]  Ferrous Fumarate (IRON) 18 MG TBCR Take 1 tablet by mouth daily.   Yes [provider]  fish oil-omega-3 fatty acids 1000 MG capsule Take 830 mg by mouth daily.   Yes [provider]  Glucosamine-Chondroitin (GLUCOSAMINE CHONDR COMPLEX PO) Take by mouth daily. 1500mg /1200mg    Yes [provider]  LUTEIN-ZEAXANTHIN PO Take 4 mg by mouth daily.   Yes [provider]  Multiple Vitamins-Minerals (MULTIVITAMIN  PO) Take 1 tablet by mouth daily.   Yes [provider]  olmesartan (BENICAR) 20 MG tablet Take 20 mg daily by mouth. 03/29/17  Yes [provider]  PHOSPHATIDYLCHOLINE PO Take 420 mg by mouth daily.   Yes [provider]  Probiotic Product (PROBIOTIC DAILY PO) Take 1 tablet by mouth daily. 5 billion unit daily   Yes [provider]  RESVERATROL 100 MG CAPS Take 1 capsule by mouth daily.   Yes [provider]  tamsulosin (FLOMAX) 0.4 MG CAPS capsule Take 1 capsule by mouth daily. 08/06/15  Yes [provider]     Family History Family History  Problem Relation Age of Onset  . Cancer Father        Lung  . Lung cancer Father   . Atrial fibrillation Mother   . Dementia Mother   . Transient ischemic attack Mother   . Hypertension Unknown   . Cancer Brother        Liver    Social History Social History   Tobacco Use  . Smoking status: Never Smoker  . Smokeless tobacco: Never Used  Substance Use Topics  . Alcohol use: No    Alcohol/week: 0.0 oz  . Drug use: No     Allergies   Patient has no known allergies.   Review of Systems Review of Systems  Constitutional: Positive for activity change and fatigue.  Respiratory: Positive for shortness of breath.   Cardiovascular: Negative for chest pain.  Gastrointestinal: Negative for nausea and vomiting.  Neurological: Positive for dizziness and weakness.  Hematological: Does not bruise/bleed easily.  All other systems reviewed and are negative.    Physical Exam Updated Vital Signs BP 114/63   Pulse 77   Temp 97.7 F (36.5 C) (Oral)   Resp 17   Ht 6\' 2"  (1.88 m)   Wt 125.2 kg (276 lb)   SpO2 97%   BMI 35.44 kg/m   Physical Exam  Constitutional: He is oriented to person, place, and time. He appears well-developed.  HENT:  Head: Atraumatic.  Neck: Neck supple.  Cardiovascular:  Irregularly irregular rhythm, tachycardia  Pulmonary/Chest: Effort normal.  Musculoskeletal: He exhibits no edema.  Neurological: He is alert and oriented to person, place, and time.  Skin: Skin is warm.  Nursing note and vitals reviewed.    ED Treatments / Results  Labs (all labs ordered are listed, but only abnormal results are displayed) Labs Reviewed  BASIC METABOLIC PANEL - Abnormal; Notable for the following components:      Result Value   Glucose, Bld 116 (*)    Calcium 8.7 (*)    All other components within normal limits  CBC - Abnormal; Notable for the following components:   Platelets 127 (*)    All other components within  normal limits  BRAIN NATRIURETIC PEPTIDE - Abnormal; Notable for the following components:   B Natriuretic Peptide 189.0 (*)    All other components within normal limits  CBG MONITORING, ED - Abnormal; Notable for the following components:   Glucose-Capillary 108 (*)    All other components within normal limits  MAGNESIUM  TSH  URINALYSIS, ROUTINE W REFLEX MICROSCOPIC  APTT  PROTIME-INR  HEPARIN LEVEL (UNFRACTIONATED)    EKG EKG Interpretation  Date/Time:  Saturday December 11 2017 12:32:57 EDT Ventricular Rate:  120 PR Interval:    QRS Duration: 97 QT Interval:  320 QTC Calculation: 453 R Axis:   0 Text Interpretation:  Atrial fibrillation Low voltage,  precordial leads No acute changes afib is new Confirmed by Varney Biles [phone]) on 12/11/2017 12:52:20 PM   Radiology Dg Chest 2 View  Result Date: 12/11/2017 CLINICAL DATA:  A-fib that started today, SOB EXAM: CHEST - 2 VIEW COMPARISON:  None. FINDINGS: Normal mediastinum and cardiac silhouette. Normal pulmonary vasculature. No evidence of effusion, infiltrate, or pneumothorax. No acute bony abnormality. IMPRESSION: No acute cardiopulmonary process. Electronically Signed   By: Suzy Bouchard M.D.   On: 12/11/2017 13:58    Procedures Procedures (including critical care time)  CRITICAL CARE Performed by: Latwan Luchsinger   Total critical care time: 49 minutes  Critical care time was exclusive of separately billable procedures and treating other patients.  Critical care was necessary to treat or prevent imminent or life-threatening deterioration.  Critical care was time spent personally by me on the following activities: development of treatment plan with patient and/or surrogate as well as nursing, discussions with consultants, evaluation of patient's response to treatment, examination of patient, obtaining history from patient or surrogate, ordering and performing treatments and interventions, ordering and review of  laboratory studies, ordering and review of radiographic studies, pulse oximetry and re-evaluation of patient's condition.   Medications Ordered in ED Medications  diltiazem (CARDIZEM) 100 mg in dextrose 5% 143mL (1 mg/mL) infusion (5 mg/hr Intravenous New Bag/Given 12/11/17 1453)  heparin bolus via infusion 4,000 Units (has no administration in time range)  heparin ADULT infusion 100 units/mL (25000 units/273mL sodium chloride 0.45%) (has no administration in time range)  diltiazem (CARDIZEM) 1 mg/mL load via infusion 20 mg (20 mg Intravenous Bolus from Bag 12/11/17 1447)     Initial Impression / Assessment and Plan / ED Course  I have reviewed the triage vital signs and the nursing notes.  Pertinent labs & imaging results that were available during my care of the patient were reviewed by me and considered in my medical decision making (see chart for details).  Clinical Course as of Dec 11 1537  Sat Dec 11, 2017  1539 Patient has decided against ED cardioversion, which I think is reasonable choice. Patient has been started on diltiazem drip and his heart rate is already down to 80s.. Heparin has been started for his A. Fib, with a high ChadVASc score.   [AN]    Clinical Course User Index [AN] Varney Biles, MD    67 year old male comes in with chief complaint of weakness.  Patient is noted to be in A. fib with RVR.  He has history of CHF, CAD and likely previous concerns for transient A. Fib.  This patients CHA2DS2-VASc Score and unadjusted Ischemic Stroke Rate (% per year) is equal to 4.8 % stroke rate/year from a score of 4  Above score calculated as 1 point each if present [CHF, HTN, DM, Vascular=MI/PAD/Aortic Plaque, Age if 65-74, or Male] Above score calculated as 2 points each if present [Age > 75, or Stroke/TIA/TE]  It appears that patient is having symptomatic A. Fib, and the onset of the symptoms and likely the A. fib is within 48 hours.  It is unclear to me if patient  actually has been going in and out of A. fib while being asymptomatic however.  Likely the underlying cause for A. fib with RVR is CHF.  Patient did not sleep well last night, which could have also contributed.  We will get basic labs.  Patient is trying to make up his mind whether he wants to be cardioverted in the ED versus rate controlled and  admission.  Risk and benefits of both the aggressive and conservative option discussed with the patient, and he is well aware that cardioverting will have high risk of stroke, especially if he is unsure about having prior asymptomatic events given his multiple risk factors for stroke.  Final Clinical Impressions(s) / ED Diagnoses   Final diagnoses:  Atrial fibrillation with RVR [facility])    ED Discharge Orders        Ordered    Amb referral to [facility]     12/11/17 [place], Danicka Hourihan, MD 12/11/17 [place], Isai Gottlieb, MD 12/11/17 1539

## 2017-12-11 NOTE — Progress Notes (Signed)
ANTICOAGULATION CONSULT NOTE - Initial Consult  Pharmacy Consult for heparin gtt  Indication: atrial fibrillation  No Known Allergies  Patient Measurements: Height: 6\' 2"  (188 cm) Weight: 276 lb (125.2 kg) IBW/kg (Calculated) : 82.2 Heparin Dosing Weight: 109.5kg  Vital Signs: Temp: 97.7 F (36.5 C) (07/20 1229) Temp Source: Oral (07/20 1229) BP: 138/70 (07/20 1430) Pulse Rate: 107 (07/20 1430)  Labs: Recent Labs    12/11/17 1320  HGB 14.0  HCT 41.3  PLT 127*  CREATININE 0.95    Estimated Creatinine Clearance: 104.6 mL/min (by C-G formula based on SCr of 0.95 mg/dL).   Medical History: Past Medical History:  Diagnosis Date  . Arthritis   . Atrial fib/flutter, transient   . CAD (coronary artery disease)    Stent x 2  . Chronic back pain    Spinal stenosis  . Gout   . HTN (hypertension)   . Neuromuscular disorder (HCC)    numbness in toes  . Thyroid nodule    Benign    Medications:   (Not in a hospital admission) Scheduled:  . heparin  4,000 Units Intravenous Once   Infusions:  . diltiazem (CARDIZEM) infusion    . heparin     PRN:  Anti-infectives (From admission, onward)   None      Assessment: Morbid obese 67 year old man requiring heparin gtt for aflutter per physician per pharmacy protocol     Goal of Therapy:  Heparin level 0.3-0.7 units/ml Monitor platelets by anticoagulation protocol: Yes   Plan:  Give 4000 units bolus x 1 Start heparin infusion at 1650 units/hr Check anti-Xa level in 6 hours and daily while on heparin Continue to monitor H&H and platelets  Donna Christen Libi Corso 12/11/2017,2:53 PM

## 2017-12-12 ENCOUNTER — Observation Stay (HOSPITAL_COMMUNITY): Payer: Medicare Other

## 2017-12-12 DIAGNOSIS — E669 Obesity, unspecified: Secondary | ICD-10-CM | POA: Diagnosis present

## 2017-12-12 DIAGNOSIS — I10 Essential (primary) hypertension: Secondary | ICD-10-CM | POA: Diagnosis not present

## 2017-12-12 DIAGNOSIS — I509 Heart failure, unspecified: Secondary | ICD-10-CM | POA: Diagnosis not present

## 2017-12-12 DIAGNOSIS — Z7901 Long term (current) use of anticoagulants: Secondary | ICD-10-CM | POA: Diagnosis not present

## 2017-12-12 DIAGNOSIS — M4800 Spinal stenosis, site unspecified: Secondary | ICD-10-CM | POA: Diagnosis present

## 2017-12-12 DIAGNOSIS — I480 Paroxysmal atrial fibrillation: Secondary | ICD-10-CM | POA: Diagnosis present

## 2017-12-12 DIAGNOSIS — M1A9XX0 Chronic gout, unspecified, without tophus (tophi): Secondary | ICD-10-CM | POA: Diagnosis not present

## 2017-12-12 DIAGNOSIS — Z801 Family history of malignant neoplasm of trachea, bronchus and lung: Secondary | ICD-10-CM | POA: Diagnosis not present

## 2017-12-12 DIAGNOSIS — E041 Nontoxic single thyroid nodule: Secondary | ICD-10-CM | POA: Diagnosis present

## 2017-12-12 DIAGNOSIS — J189 Pneumonia, unspecified organism: Secondary | ICD-10-CM | POA: Diagnosis not present

## 2017-12-12 DIAGNOSIS — I5022 Chronic systolic (congestive) heart failure: Secondary | ICD-10-CM | POA: Diagnosis present

## 2017-12-12 DIAGNOSIS — I428 Other cardiomyopathies: Secondary | ICD-10-CM | POA: Diagnosis present

## 2017-12-12 DIAGNOSIS — G629 Polyneuropathy, unspecified: Secondary | ICD-10-CM | POA: Diagnosis present

## 2017-12-12 DIAGNOSIS — R05 Cough: Secondary | ICD-10-CM | POA: Diagnosis not present

## 2017-12-12 DIAGNOSIS — I2510 Atherosclerotic heart disease of native coronary artery without angina pectoris: Secondary | ICD-10-CM | POA: Diagnosis present

## 2017-12-12 DIAGNOSIS — N400 Enlarged prostate without lower urinary tract symptoms: Secondary | ICD-10-CM | POA: Diagnosis present

## 2017-12-12 DIAGNOSIS — I481 Persistent atrial fibrillation: Secondary | ICD-10-CM | POA: Diagnosis present

## 2017-12-12 DIAGNOSIS — Z6834 Body mass index (BMI) 34.0-34.9, adult: Secondary | ICD-10-CM | POA: Diagnosis not present

## 2017-12-12 DIAGNOSIS — D696 Thrombocytopenia, unspecified: Secondary | ICD-10-CM | POA: Diagnosis present

## 2017-12-12 DIAGNOSIS — I4891 Unspecified atrial fibrillation: Secondary | ICD-10-CM | POA: Diagnosis not present

## 2017-12-12 DIAGNOSIS — Z9849 Cataract extraction status, unspecified eye: Secondary | ICD-10-CM | POA: Diagnosis not present

## 2017-12-12 DIAGNOSIS — I5020 Unspecified systolic (congestive) heart failure: Secondary | ICD-10-CM | POA: Diagnosis not present

## 2017-12-12 DIAGNOSIS — M549 Dorsalgia, unspecified: Secondary | ICD-10-CM | POA: Diagnosis present

## 2017-12-12 DIAGNOSIS — M1990 Unspecified osteoarthritis, unspecified site: Secondary | ICD-10-CM | POA: Diagnosis present

## 2017-12-12 DIAGNOSIS — M109 Gout, unspecified: Secondary | ICD-10-CM | POA: Diagnosis present

## 2017-12-12 DIAGNOSIS — D649 Anemia, unspecified: Secondary | ICD-10-CM | POA: Diagnosis not present

## 2017-12-12 DIAGNOSIS — Z79899 Other long term (current) drug therapy: Secondary | ICD-10-CM | POA: Diagnosis not present

## 2017-12-12 DIAGNOSIS — Z800 Family history of malignant neoplasm of digestive organs: Secondary | ICD-10-CM | POA: Diagnosis not present

## 2017-12-12 DIAGNOSIS — J181 Lobar pneumonia, unspecified organism: Secondary | ICD-10-CM | POA: Diagnosis not present

## 2017-12-12 DIAGNOSIS — G8929 Other chronic pain: Secondary | ICD-10-CM | POA: Diagnosis present

## 2017-12-12 DIAGNOSIS — R509 Fever, unspecified: Secondary | ICD-10-CM | POA: Diagnosis present

## 2017-12-12 DIAGNOSIS — I340 Nonrheumatic mitral (valve) insufficiency: Secondary | ICD-10-CM | POA: Diagnosis not present

## 2017-12-12 DIAGNOSIS — I110 Hypertensive heart disease with heart failure: Secondary | ICD-10-CM | POA: Diagnosis present

## 2017-12-12 LAB — BASIC METABOLIC PANEL
Anion gap: 10 (ref 5–15)
BUN: 18 mg/dL (ref 8–23)
CO2: 21 mmol/L — ABNORMAL LOW (ref 22–32)
CREATININE: 0.97 mg/dL (ref 0.61–1.24)
Calcium: 8.3 mg/dL — ABNORMAL LOW (ref 8.9–10.3)
Chloride: 111 mmol/L (ref 98–111)
Glucose, Bld: 120 mg/dL — ABNORMAL HIGH (ref 70–99)
Potassium: 3.7 mmol/L (ref 3.5–5.1)
SODIUM: 142 mmol/L (ref 135–145)

## 2017-12-12 LAB — CBC
HCT: 40.8 % (ref 39.0–52.0)
Hemoglobin: 13.4 g/dL (ref 13.0–17.0)
MCH: 29.4 pg (ref 26.0–34.0)
MCHC: 32.8 g/dL (ref 30.0–36.0)
MCV: 89.5 fL (ref 78.0–100.0)
PLATELETS: 119 10*3/uL — AB (ref 150–400)
RBC: 4.56 MIL/uL (ref 4.22–5.81)
RDW: 13.8 % (ref 11.5–15.5)
WBC: 5.9 10*3/uL (ref 4.0–10.5)

## 2017-12-12 LAB — ECHOCARDIOGRAM COMPLETE
Height: 74 in
Weight: 4264.58 oz

## 2017-12-12 LAB — HEPARIN LEVEL (UNFRACTIONATED): HEPARIN UNFRACTIONATED: 0.48 [IU]/mL (ref 0.30–0.70)

## 2017-12-12 LAB — TROPONIN I

## 2017-12-12 MED ORDER — SODIUM CHLORIDE 0.9 % IV SOLN
INTRAVENOUS | Status: DC
  Administered 2017-12-12 – 2017-12-13 (×3): via INTRAVENOUS

## 2017-12-12 MED ORDER — RIVAROXABAN 20 MG PO TABS
20.0000 mg | ORAL_TABLET | Freq: Every day | ORAL | Status: DC
  Administered 2017-12-12 – 2017-12-13 (×2): 20 mg via ORAL
  Filled 2017-12-12 (×2): qty 1

## 2017-12-12 NOTE — Progress Notes (Signed)
ANTICOAGULATION CONSULT NOTE - Initial Consult  Pharmacy Consult for heparin gtt  Indication: atrial fibrillation  No Known Allergies  Patient Measurements: Height: 6\' 2"  (188 cm) Weight: 266 lb 8.6 oz (120.9 kg) IBW/kg (Calculated) : 82.2 Heparin Dosing Weight: 109.5kg  Vital Signs: Temp: 98.7 F (37.1 C) (07/21 0743) Temp Source: Oral (07/21 0743) BP: 113/66 (07/21 0826) Pulse Rate: 107 (07/21 0826)  Labs: Recent Labs    12/11/17 1320 12/11/17 1452 12/11/17 1803 12/11/17 2203 12/11/17 2204 12/12/17 0459 12/12/17 0500  HGB 14.0  --   --   --   --   --  13.4  HCT 41.3  --   --   --   --   --  40.8  PLT 127*  --   --   --   --   --  119*  APTT  --  27  --   --   --   --   --   LABPROT  --  13.9  --   --   --   --   --   INR  --  1.08  --   --   --   --   --   HEPARINUNFRC  --   --   --  0.54  --  0.48  --   CREATININE 0.95  --   --   --   --   --  0.97  TROPONINI  --   --  <0.03  --  <0.03  --  <0.03    Estimated Creatinine Clearance: 100.7 mL/min (by C-G formula based on SCr of 0.97 mg/dL).   Medical History: Past Medical History:  Diagnosis Date  . Arthritis   . Atrial fib/flutter, transient   . BPH (benign prostatic hyperplasia) 12/11/2017  . CAD (coronary artery disease)    Stent x 2  . Chronic back pain    Spinal stenosis  . Gout   . HTN (hypertension)   . Neuromuscular disorder (HCC)    numbness in toes  . Thyroid nodule    Benign    Medications:  Medications Prior to Admission  Medication Sig Dispense Refill Last Dose  . allopurinol (ZYLOPRIM) 300 MG tablet Take 300 mg by mouth daily.   Past Week at Unknown time  . amLODipine (NORVASC) 10 MG tablet TAKE ONE TABLET BY MOUTH DAILY. 90 tablet 3 Past Week at Unknown time  . Calcium Carb-Cholecalciferol (CALCIUM + D3 PO) Take 1 tablet by mouth daily.   Past Week at Unknown time  . carvedilol (COREG) 3.125 MG tablet Take 3.125 mg 2 (two) times daily with a meal by mouth.   Past Week at Unknown time   . Cholecalciferol (VITAMIN D3) 2000 UNITS TABS Take 1 tablet by mouth daily.   Past Week at Unknown time  . CINNAMON PO Take 350 mg by mouth daily.   Past Week at Unknown time  . Coenzyme Q10 200 MG capsule Take 200 mg by mouth daily.   Past Week at Unknown time  . Ferrous Fumarate (IRON) 18 MG TBCR Take 1 tablet by mouth daily.   Past Week at Unknown time  . fish oil-omega-3 fatty acids 1000 MG capsule Take 830 mg by mouth daily.   Past Week at Unknown time  . Glucosamine-Chondroitin (GLUCOSAMINE CHONDR COMPLEX PO) Take by mouth daily. 1500mg /1200mg    Past Week at Unknown time  . LUTEIN-ZEAXANTHIN PO Take 4 mg by mouth daily.   Past Week at Unknown time  .  Multiple Vitamins-Minerals (MULTIVITAMIN PO) Take 1 tablet by mouth daily.   12/11/2017 at Unknown time  . olmesartan (BENICAR) 20 MG tablet Take 20 mg daily by mouth.  4 12/11/2017 at Unknown time  . PHOSPHATIDYLCHOLINE PO Take 420 mg by mouth daily.   Past Week at Unknown time  . Probiotic Product (PROBIOTIC DAILY PO) Take 1 tablet by mouth daily. 5 billion unit daily   Past Week at Unknown time  . RESVERATROL 100 MG CAPS Take 1 capsule by mouth daily.   Past Week at Unknown time  . tamsulosin (FLOMAX) 0.4 MG CAPS capsule Take 1 capsule by mouth daily.  5 12/11/2017 at Unknown time   Scheduled:  . allopurinol  300 mg Oral Daily  . carvedilol  6.25 mg Oral BID WC  . sodium chloride flush  3 mL Intravenous Q12H  . tamsulosin  0.4 mg Oral Daily   Infusions:  . sodium chloride    . sodium chloride 100 mL/hr at 12/12/17 1014  . diltiazem (CARDIZEM) infusion 5 mg/hr (12/12/17 0051)   PRN:  Anti-infectives (From admission, onward)   None      Assessment: Morbid obese 67 year old man requiring heparin gtt for aflutter per physician per pharmacy protocol     Goal of Therapy:  Heparin level 0.3-0.7 units/ml Monitor platelets by anticoagulation protocol: Yes   Plan:  Give 4000 units bolus x 1 Start heparin infusion at 1650  units/hr Check anti-Xa level in 6 hours and daily while on heparin Continue to monitor H&H and platelets 7/21 HL 0.54, therapeutic, continue heparin 1650 units/hr will recheck with AM labs  7/22 HL 0.48, therapeutic, continue heparin 1650 units/hr will recheck with AM labs. CBC stable, continue to monitor, especially platelets.   Alishba Naples 12/12/2017,10:35 AM

## 2017-12-12 NOTE — Progress Notes (Signed)
*  PRELIMINARY RESULTS* Echocardiogram 2D Echocardiogram has been performed.  Leavy Cella 12/12/2017, 9:57 AM

## 2017-12-12 NOTE — Progress Notes (Signed)
ANTICOAGULATION CONSULT NOTE - follow up   Pharmacy Consult for heparin gtt discontinued will change to Xarelto  Indication: atrial fibrillation  No Known Allergies  Patient Measurements: Height: 6\' 2"  (188 cm) Weight: 266 lb 8.6 oz (120.9 kg) IBW/kg (Calculated) : 82.2 Heparin Dosing Weight: 109.5kg  Vital Signs: Temp: 98.7 F (37.1 C) (07/21 0743) Temp Source: Oral (07/21 0743) BP: 113/66 (07/21 0826) Pulse Rate: 107 (07/21 0826)  Labs: Recent Labs    12/11/17 1320 12/11/17 1452 12/11/17 1803 12/11/17 2203 12/11/17 2204 12/12/17 0459 12/12/17 0500  HGB 14.0  --   --   --   --   --  13.4  HCT 41.3  --   --   --   --   --  40.8  PLT 127*  --   --   --   --   --  119*  APTT  --  27  --   --   --   --   --   LABPROT  --  13.9  --   --   --   --   --   INR  --  1.08  --   --   --   --   --   HEPARINUNFRC  --   --   --  0.54  --  0.48  --   CREATININE 0.95  --   --   --   --   --  0.97  TROPONINI  --   --  <0.03  --  <0.03  --  <0.03    Estimated Creatinine Clearance: 100.7 mL/min (by C-G formula based on SCr of 0.97 mg/dL).   Medical History: Past Medical History:  Diagnosis Date  . Arthritis   . Atrial fib/flutter, transient   . BPH (benign prostatic hyperplasia) 12/11/2017  . CAD (coronary artery disease)    Stent x 2  . Chronic back pain    Spinal stenosis  . Gout   . HTN (hypertension)   . Neuromuscular disorder (HCC)    numbness in toes  . Thyroid nodule    Benign    Medications:  Medications Prior to Admission  Medication Sig Dispense Refill Last Dose  . allopurinol (ZYLOPRIM) 300 MG tablet Take 300 mg by mouth daily.   Past Week at Unknown time  . amLODipine (NORVASC) 10 MG tablet TAKE ONE TABLET BY MOUTH DAILY. 90 tablet 3 Past Week at Unknown time  . Calcium Carb-Cholecalciferol (CALCIUM + D3 PO) Take 1 tablet by mouth daily.   Past Week at Unknown time  . carvedilol (COREG) 3.125 MG tablet Take 3.125 mg 2 (two) times daily with a meal by  mouth.   Past Week at Unknown time  . Cholecalciferol (VITAMIN D3) 2000 UNITS TABS Take 1 tablet by mouth daily.   Past Week at Unknown time  . CINNAMON PO Take 350 mg by mouth daily.   Past Week at Unknown time  . Coenzyme Q10 200 MG capsule Take 200 mg by mouth daily.   Past Week at Unknown time  . Ferrous Fumarate (IRON) 18 MG TBCR Take 1 tablet by mouth daily.   Past Week at Unknown time  . fish oil-omega-3 fatty acids 1000 MG capsule Take 830 mg by mouth daily.   Past Week at Unknown time  . Glucosamine-Chondroitin (GLUCOSAMINE CHONDR COMPLEX PO) Take by mouth daily. 1500mg /1200mg    Past Week at Unknown time  . LUTEIN-ZEAXANTHIN PO Take 4 mg by mouth daily.  Past Week at Unknown time  . Multiple Vitamins-Minerals (MULTIVITAMIN PO) Take 1 tablet by mouth daily.   12/11/2017 at Unknown time  . olmesartan (BENICAR) 20 MG tablet Take 20 mg daily by mouth.  4 12/11/2017 at Unknown time  . PHOSPHATIDYLCHOLINE PO Take 420 mg by mouth daily.   Past Week at Unknown time  . Probiotic Product (PROBIOTIC DAILY PO) Take 1 tablet by mouth daily. 5 billion unit daily   Past Week at Unknown time  . RESVERATROL 100 MG CAPS Take 1 capsule by mouth daily.   Past Week at Unknown time  . tamsulosin (FLOMAX) 0.4 MG CAPS capsule Take 1 capsule by mouth daily.  5 12/11/2017 at Unknown time   Scheduled:  . allopurinol  300 mg Oral Daily  . carvedilol  6.25 mg Oral BID WC  . rivaroxaban  20 mg Oral Q lunch  . sodium chloride flush  3 mL Intravenous Q12H  . tamsulosin  0.4 mg Oral Daily   Infusions:  . sodium chloride    . sodium chloride 100 mL/hr at 12/12/17 1014  . diltiazem (CARDIZEM) infusion 5 mg/hr (12/12/17 0051)   PRN:  Anti-infectives (From admission, onward)   None      Assessment: Morbid obese 67 year old man requiring heparin gtt for aflutter per physician per pharmacy protocol     Goal of Therapy:  Heparin level 0.3-0.7 units/ml Monitor platelets by anticoagulation protocol: Yes    Plan:  Give 4000 units bolus x 1 Start heparin infusion at 1650 units/hr Check anti-Xa level in 6 hours and daily while on heparin Continue to monitor H&H and platelets 7/21 HL 0.54, therapeutic, continue heparin 1650 units/hr will recheck with AM labs  7/22 HL 0.48, therapeutic, continue heparin 1650 units/hr will recheck with AM labs. CBC stable, continue to monitor, especially platelets.   7/22 Stop Heparin gtt. Will start xarelto 20mg  po daily with lunch (want to start within 2 hours of heparin gtt discontinuation per transition guidelines). Pharmacy to continue to monitor CBC  Donna Christen Shreeya Recendiz 12/12/2017,10:40 AM

## 2017-12-12 NOTE — Progress Notes (Signed)
PROGRESS NOTE    [name]  ZGY:[id number] DOB: February 13, 1951 DOA: 12/11/2017 PCP: Glenda Chroman, MD    Brief Narrative:  67 year old male with history of cardiomyopathy, hypertension, admitted to the hospital with acute onset of palpitations and general fatigue.  Found to be in rapid atrial fibrillation.  Admitted for further rate control.   Assessment & Plan:   Active Problems:   Atrial fibrillation with RVR (HCC)   Chronic systolic CHF (congestive heart failure) (HCC)   HTN (hypertension)   Gout   BPH (benign prostatic hyperplasia)   1. A. fib with RVR.  New onset.  Currently on intravenous diltiazem infusion.  Home dose of Coreg was increased.  Continue to follow heart rate and wean off diltiazem as tolerated. CHADSVASc score of 4.  Currently on intravenous heparin.  Will transition to [place].  TSH normal.  Cardiac enzymes negative.  Echocardiogram is been ordered. 2. Chronic systolic CHF.  Appears compensated.  Repeat echocardiogram is been ordered.  Continue to monitor. 3. Hypertension.  Currently stable.  Antihypertensives currently on hold so as to allow more flexibility with rate control. 4. BPH.  Continue Flomax 5. Gout.  No signs of acute flare.  Continue allopurinol.   DVT prophylaxis: xarelto Code Status: full code Family Communication: discussed with wife at the bedside Disposition Plan: discharge home once improved   Consultants:     Procedures:     Antimicrobials:      Subjective: He was dizzy when he sat up in the chair. No shortness of breath or chest pain  Objective: Vitals:   12/12/17 0300 12/12/17 0451 12/12/17 0743 12/12/17 0826  BP: (!) 97/56   113/66  Pulse: 76  98 (!) 107  Resp: 10  19   Temp:  98.6 F (37 C) 98.7 F (37.1 C)   TempSrc:  Oral Oral   SpO2: 93%  94%   Weight:  120.9 kg (266 lb 8.6 oz)    Height:        Intake/Output Summary (Last 24 hours) at 12/12/2017 0936 Last data filed at 12/12/2017 0600 Gross per 24  hour  Intake 361.79 ml  Output -  Net 361.79 ml   Filed Weights   12/11/17 1727 12/11/17 1728 12/12/17 0451  Weight: 120.9 kg (266 lb 8.6 oz) 120.9 kg (266 lb 8.6 oz) 120.9 kg (266 lb 8.6 oz)    Examination:  General exam: Appears calm and comfortable  Respiratory system: Clear to auscultation. Respiratory effort normal. Cardiovascular system: S1 & S2 heard, irregular. No JVD, murmurs, rubs, gallops or clicks. No pedal edema. Gastrointestinal system: Abdomen is nondistended, soft and nontender. No organomegaly or masses felt. Normal bowel sounds heard. Central nervous system: Alert and oriented. No focal neurological deficits. Extremities: Symmetric 5 x 5 power. Skin: No rashes, lesions or ulcers Psychiatry: Judgement and insight appear normal. Mood & affect appropriate.     Data Reviewed: I have personally reviewed following labs and imaging studies  CBC: Recent Labs  Lab 12/11/17 1320 12/12/17 0500  WBC 6.8 5.9  HGB 14.0 13.4  HCT 41.3 40.8  MCV 89.0 89.5  PLT 127* 591*   Basic Metabolic Panel: Recent Labs  Lab 12/11/17 1320 12/12/17 0500  NA 141 142  K 4.1 3.7  CL 108 111  CO2 26 21*  GLUCOSE 116* 120*  BUN 16 18  CREATININE 0.95 0.97  CALCIUM 8.7* 8.3*  MG 2.2  --    GFR: Estimated Creatinine Clearance: 100.7 mL/min (by C-G formula  based on SCr of 0.97 mg/dL). Liver Function Tests: No results for input(s): AST, ALT, ALKPHOS, BILITOT, PROT, ALBUMIN in the last 168 hours. No results for input(s): LIPASE, AMYLASE in the last 168 hours. No results for input(s): AMMONIA in the last 168 hours. Coagulation Profile: Recent Labs  Lab 12/11/17 1452  INR 1.08   Cardiac Enzymes: Recent Labs  Lab 12/11/17 1803 12/11/17 2204 12/12/17 0500  TROPONINI <0.03 <0.03 <0.03   BNP (last 3 results) No results for input(s): PROBNP in the last 8760 hours. HbA1C: No results for input(s): HGBA1C in the last 72 hours. CBG: Recent Labs  Lab 12/11/17 1247  GLUCAP  108*   Lipid Profile: No results for input(s): CHOL, HDL, LDLCALC, TRIG, CHOLHDL, LDLDIRECT in the last 72 hours. Thyroid Function Tests: Recent Labs    12/11/17 1320  TSH 2.155   Anemia Panel: No results for input(s): VITAMINB12, FOLATE, FERRITIN, TIBC, IRON, RETICCTPCT in the last 72 hours. Sepsis Labs: No results for input(s): PROCALCITON, LATICACIDVEN in the last 168 hours.  Recent Results (from the past 240 hour(s))  MRSA PCR Screening     Status: None   Collection Time: 12/11/17  4:39 PM  Result Value Ref Range Status   MRSA by PCR NEGATIVE NEGATIVE Final    Comment:        The GeneXpert MRSA Assay (FDA approved for NASAL specimens only), is one component of a comprehensive MRSA colonization surveillance program. It is not intended to diagnose MRSA infection nor to guide or monitor treatment for MRSA infections. Performed at [facility], [address], [place], [place] [address]          Radiology Studies: Dg Chest 2 View  Result Date: 12/11/2017 CLINICAL DATA:  A-fib that started today, SOB EXAM: CHEST - 2 VIEW COMPARISON:  None. FINDINGS: Normal mediastinum and cardiac silhouette. Normal pulmonary vasculature. No evidence of effusion, infiltrate, or pneumothorax. No acute bony abnormality. IMPRESSION: No acute cardiopulmonary process. Electronically Signed   By: Suzy Bouchard M.D.   On: 12/11/2017 13:58        Scheduled Meds: . allopurinol  300 mg Oral Daily  . carvedilol  6.25 mg Oral BID WC  . sodium chloride flush  3 mL Intravenous Q12H  . tamsulosin  0.4 mg Oral Daily   Continuous Infusions: . sodium chloride    . sodium chloride    . diltiazem (CARDIZEM) infusion 5 mg/hr (12/12/17 0051)     LOS: 0 days    Time spent: 56mins    Kathie Dike, MD [facility] Pager [phone]  If 7PM-7AM, please contact night-coverage [email or web address] Password [facility] 12/12/2017, 9:36 AM

## 2017-12-13 ENCOUNTER — Encounter (HOSPITAL_COMMUNITY): Payer: Self-pay | Admitting: Physician Assistant

## 2017-12-13 DIAGNOSIS — I428 Other cardiomyopathies: Secondary | ICD-10-CM

## 2017-12-13 DIAGNOSIS — I2510 Atherosclerotic heart disease of native coronary artery without angina pectoris: Secondary | ICD-10-CM | POA: Diagnosis present

## 2017-12-13 DIAGNOSIS — I4891 Unspecified atrial fibrillation: Secondary | ICD-10-CM

## 2017-12-13 LAB — HIV ANTIBODY (ROUTINE TESTING W REFLEX): HIV Screen 4th Generation wRfx: NONREACTIVE

## 2017-12-13 MED ORDER — SODIUM CHLORIDE 0.9 % IV SOLN: INTRAVENOUS | Status: DC

## 2017-12-13 MED ORDER — CARVEDILOL 12.5 MG PO TABS
12.5000 mg | ORAL_TABLET | Freq: Two times a day (BID) | ORAL | Status: DC
  Administered 2017-12-13 – 2017-12-14 (×2): 12.5 mg via ORAL
  Filled 2017-12-13 (×2): qty 1

## 2017-12-13 MED ORDER — RIVAROXABAN 20 MG PO TABS
20.0000 mg | ORAL_TABLET | Freq: Every day | ORAL | Status: DC
  Administered 2017-12-14: 20 mg via ORAL
  Filled 2017-12-13: qty 1

## 2017-12-13 MED ORDER — SODIUM CHLORIDE 0.9% FLUSH: 3.0000 mL | INTRAVENOUS | Status: DC | PRN

## 2017-12-13 MED ORDER — SODIUM CHLORIDE 0.9% FLUSH
3.0000 mL | Freq: Two times a day (BID) | INTRAVENOUS | Status: DC
  Administered 2017-12-13 (×2): 3 mL via INTRAVENOUS

## 2017-12-13 MED ORDER — GUAIFENESIN-DM 100-10 MG/5ML PO SYRP
15.0000 mL | ORAL_SOLUTION | ORAL | Status: DC | PRN
  Administered 2017-12-13 – 2017-12-14 (×2): 15 mL via ORAL
  Filled 2017-12-13 (×2): qty 15

## 2017-12-13 MED ORDER — SODIUM CHLORIDE 0.9 % IV SOLN: 250.0000 mL | INTRAVENOUS | Status: DC

## 2017-12-13 MED ORDER — SODIUM CHLORIDE 0.9 % IV SOLN
INTRAVENOUS | Status: DC
  Administered 2017-12-14: 11:00:00 via INTRAVENOUS

## 2017-12-13 NOTE — H&P (View-Only) (Signed)
Cardiology Consultation:   Patient ID: [name]; [id number]; 1950/11/02   Admit date: 12/11/2017 Date of Consult: 12/13/2017  Primary Care Provider: Glenda Chroman, MD Primary Cardiologist: Kate Sable, MD    Patient Profile:   [name] is a 67 y.o. male with a hx of nonischemic cardiomyopathy and hypertension who is being seen today for the evaluation of newly documented persistent atrial fibrillation with RVR at the request of Dr. [name].  History of Present Illness:   [name] has a history of chronic systolic CHF, LVEF 33% with grade 1 DD, moderate LAE 03/2016-presumed nonischemic CM, essential HTN. Last saw Dr. Bronson Ing 03/2017 and doing well. Submaximal GXT 2013 without ischemia.  Patient says Sat when he got up he wasn't feeling well-dizzy, heart racing and a little short of breath. Has never had before. A month ago while waiting in the heat for a concert he became dizzy which resolved with getting inside and having a piece of candy. No palpitations at that time. No history of chest pain. Was walking on treadmill 30 min daily but quite a month ago for no reason. No alcohol, nonsmoker. BP running low so limited with titrating up coreg, still on IV diltiazem 5 mg/hr. Takes norvasc and benicar for HTN at home but not getting here. Echo yest LVEF 45-50% LA moderately dilated.  Past Medical History:  Diagnosis Date  . Arthritis   . Atrial fib/flutter, transient   . BPH (benign prostatic hyperplasia) 12/11/2017  . Chronic back pain    Spinal stenosis  . Essential hypertension   . Gout   . Neuropathy   . Thyroid nodule    Benign    Past Surgical History:  Procedure Laterality Date  . CATARACT EXTRACTION    . COLONOSCOPY  2012  . IRRIGATION AND DEBRIDEMENT SEBACEOUS CYST    . LUMBAR LAMINECTOMY N/A 01/20/2013   Procedure: MICRODISCECTOMY LUMBAR LAMINECTOMY;  Surgeon: Marybelle Killings, MD;  Location: [facility];  Service: Orthopedics;  Laterality: N/A;  L4-5  Decompression  . PILONIDAL CYST DRAINAGE       Home Medications:  Prior to Admission medications   Medication Sig Start Date End Date Taking? Authorizing Provider  allopurinol (ZYLOPRIM) 300 MG tablet Take 300 mg by mouth daily.   Yes [provider]  amLODipine (NORVASC) 10 MG tablet TAKE ONE TABLET BY MOUTH DAILY. 09/06/17  Yes Herminio Commons, MD  Calcium Carb-Cholecalciferol (CALCIUM + D3 PO) Take 1 tablet by mouth daily.   Yes [provider]  carvedilol (COREG) 3.125 MG tablet Take 3.125 mg 2 (two) times daily with a meal by mouth.   Yes [provider]  Cholecalciferol (VITAMIN D3) 2000 UNITS TABS Take 1 tablet by mouth daily.   Yes [provider]  CINNAMON PO Take 350 mg by mouth daily.   Yes [provider]  Coenzyme Q10 200 MG capsule Take 200 mg by mouth daily.   Yes [provider]  Ferrous Fumarate (IRON) 18 MG TBCR Take 1 tablet by mouth daily.   Yes [provider]  fish oil-omega-3 fatty acids 1000 MG capsule Take 830 mg by mouth daily.   Yes [provider]  Glucosamine-Chondroitin (GLUCOSAMINE CHONDR COMPLEX PO) Take by mouth daily. 1500mg /1200mg    Yes [provider]  LUTEIN-ZEAXANTHIN PO Take 4 mg by mouth daily.   Yes [provider]  Multiple Vitamins-Minerals (MULTIVITAMIN PO) Take 1 tablet by mouth daily.   Yes [provider]  olmesartan [facility])  20 MG tablet Take 20 mg daily by mouth. 03/29/17  Yes [provider]  PHOSPHATIDYLCHOLINE PO Take 420 mg by mouth daily.   Yes [provider]  Probiotic Product (PROBIOTIC DAILY PO) Take 1 tablet by mouth daily. 5 billion unit daily   Yes [provider]  RESVERATROL 100 MG CAPS Take 1 capsule by mouth daily.   Yes [provider]  tamsulosin (FLOMAX) 0.4 MG CAPS capsule Take 1 capsule by mouth daily. 08/06/15  Yes [provider]    Inpatient Medications: Scheduled Meds: .  allopurinol  300 mg Oral Daily  . carvedilol  6.25 mg Oral BID WC  . rivaroxaban  20 mg Oral Q lunch  . sodium chloride flush  3 mL Intravenous Q12H  . tamsulosin  0.4 mg Oral Daily   Continuous Infusions: . sodium chloride    . diltiazem (CARDIZEM) infusion 5 mg/hr (12/13/17 1026)   PRN Meds: sodium chloride, acetaminophen, ondansetron (ZOFRAN) IV, sodium chloride flush  Allergies:   No Known Allergies  Social History:   Social History   Socioeconomic History  . Marital status: Married    Spouse name: Not on file  . Number of children: 3  . Years of education: Not on file  . Highest education level: Not on file  Occupational History  . Not on file  Social Needs  . Financial resource strain: Not on file  . Food insecurity:    Worry: Not on file    Inability: Not on file  . Transportation needs:    Medical: Not on file    Non-medical: Not on file  Tobacco Use  . Smoking status: Never Smoker  . Smokeless tobacco: Never Used  Substance and Sexual Activity  . Alcohol use: No    Alcohol/week: 0.0 oz  . Drug use: No  . Sexual activity: Not on file  Lifestyle  . Physical activity:    Days per week: Not on file    Minutes per session: Not on file  . Stress: Not on file  Relationships  . Social connections:    Talks on phone: Not on file    Gets together: Not on file    Attends religious service: Not on file    Active member of club or organization: Not on file    Attends meetings of clubs or organizations: Not on file    Relationship status: Not on file  . Intimate partner violence:    Fear of current or ex partner: Not on file    Emotionally abused: Not on file    Physically abused: Not on file    Forced sexual activity: Not on file  Other Topics Concern  . Not on file  Social History Narrative  . Not on file    Family History:    Family History  Problem Relation Age of Onset  . Lung cancer Father   . Cancer - Lung Father   . Atrial fibrillation Mother    . Dementia Mother   . Transient ischemic attack Mother   . Hypertension Unknown   . Liver cancer Brother      ROS:  Please see the history of present illness.  Review of Systems  Constitution: Negative.  HENT: Negative.   Cardiovascular: Positive for palpitations.  Respiratory: Positive for shortness of breath.   Endocrine: Negative.   Hematologic/Lymphatic: Negative.   Musculoskeletal: Negative.   Gastrointestinal: Negative.   Genitourinary: Negative.   Neurological: Positive for weakness.    All  other ROS reviewed and negative.     Physical Exam/Data:   Vitals:   12/13/17 1000 12/13/17 1030 12/13/17 1100 12/13/17 1200  BP: 113/69 106/67 112/70   Pulse: 95 (!) 111 100   Resp: 19 (!) 26 17   Temp:    98.8 F (37.1 C)  TempSrc:    Oral  SpO2: 95% 95% 97%   Weight:      Height:        Intake/Output Summary (Last 24 hours) at 12/13/2017 1210 Last data filed at 12/13/2017 0905 Gross per 24 hour  Intake 2462.67 ml  Output -  Net 2462.67 ml   Filed Weights   12/11/17 1727 12/11/17 1728 12/12/17 0451  Weight: 266 lb 8.6 oz (120.9 kg) 266 lb 8.6 oz (120.9 kg) 266 lb 8.6 oz (120.9 kg)   Body mass index is 34.22 kg/m.   General: Obese male, in no acute distress HEENT: normal Lymph: no adenopathy Neck: no JVD Endocrine:  No thryomegaly Vascular: No carotid bruits; FA pulses 2+ bilaterally without bruits  Cardiac:  Irreg irreg; 2/6 sys murmur LSB Lungs:  clear to auscultation bilaterally, no wheezing, rhonchi or rales  Abd: soft, nontender, no hepatomegaly  Ext: no edema Musculoskeletal:  No deformities, BUE and BLE strength normal and equal Skin: warm and dry  Neuro:  CNs 2-12 intact, no focal abnormalities noted Psych:  Normal affect   EKG:  The EKG was personally reviewed and demonstrates: Afib-new otherwise no acute change Telemetry:  Telemetry was personally reviewed and demonstrates:  Afib 90-140's  Relevant CV Studies: Echo 12/12/17 Study  Conclusions   - Left ventricle: EF difficult to estimate due to presence of   atrial fibrillation and variable heart rate. Appears mildly   depressed. The cavity size was normal. Wall thickness was   increased in a pattern of mild LVH. Systolic function was mildly   reduced. The estimated ejection fraction was in the range of 45%   to 50%. Wall motion was normal; there were no regional wall   motion abnormalities. - Left atrium: The atrium was moderately dilated. - Pulmonary arteries: Systolic pressure was mildly increased. PA   peak pressure: 32 mm Hg (S).  Laboratory Data:  Chemistry Recent Labs  Lab 12/11/17 1320 12/12/17 0500  NA 141 142  K 4.1 3.7  CL 108 111  CO2 26 21*  GLUCOSE 116* 120*  BUN 16 18  CREATININE 0.95 0.97  CALCIUM 8.7* 8.3*  GFRNONAA >60 >60  GFRAA >60 >60  ANIONGAP 7 10    No results for input(s): PROT, ALBUMIN, AST, ALT, ALKPHOS, BILITOT in the last 168 hours. Hematology Recent Labs  Lab 12/11/17 1320 12/12/17 0500  WBC 6.8 5.9  RBC 4.64 4.56  HGB 14.0 13.4  HCT 41.3 40.8  MCV 89.0 89.5  MCH 30.2 29.4  MCHC 33.9 32.8  RDW 13.6 13.8  PLT 127* 119*   Cardiac Enzymes Recent Labs  Lab 12/11/17 1803 12/11/17 2204 12/12/17 0500  TROPONINI <0.03 <0.03 <0.03   No results for input(s): TROPIPOC in the last 168 hours.  BNP Recent Labs  Lab 12/11/17 1320  BNP 189.0*    DDimer No results for input(s): DDIMER in the last 168 hours.  Radiology/Studies:  Dg Chest 2 View  Result Date: 12/11/2017 CLINICAL DATA:  A-fib that started today, SOB EXAM: CHEST - 2 VIEW COMPARISON:  None. FINDINGS: Normal mediastinum and cardiac silhouette. Normal pulmonary vasculature. No evidence of effusion, infiltrate, or pneumothorax. No acute bony abnormality.  IMPRESSION: No acute cardiopulmonary process. Electronically Signed   By: Suzy Bouchard M.D.   On: 12/11/2017 13:58    Assessment and Plan:   1. Atrial fib with RVR, CHADSVASC=3-age/HTN/CHF, now on  Xarelto per primary team. TSH normal. Will try to increase Coreg and stop IV diltiazem. Will discuss possible TEE DCCV with Dr. Domenic Polite. 2. History of nonischemic CM EF 45-50% 3. HTN was on norvasc and benicar but on hold b/c of low BP 4. Gout history   For questions or updates, please contact [place] Please consult [email or web address] for contact info under Cardiology/STEMI.   Signed, Ermalinda Barrios, PA-C 12/13/2017 12:10 PM   Attending note:  Patient seen and examined.  I reviewed his records and discussed the case with Ms. Bonnell Public PA-C.  Mr. Karge presents to the hospital with newly persisting palpitations associated with mild shortness of breath and lightheadedness.  He was diagnosed with rapid atrial fibrillation and admitted to the hospital for further evaluation.  CHADSVASC score is 3.  He does describe brief, limited episodes of palpitations in the past, possibly has paroxysmal atrial fibrillation.  He was initially placed on intravenous heparin and ultimately transitioned to Xarelto by primary team.  Heart rate is in the 100-120 range on combination of Coreg and intravenous diltiazem.  Baseline blood pressure has limited upward titration of medical therapy somewhat.  On examination he is in no distress, sitting in bedside chair.  Lungs are clear without labored breathing.  Cardiac exam reveals irregularly irregular rhythm with 2/6 systolic murmur.  No carotid bruits.  Lab work reveals potassium 3.7, BUN 18, creatinine 0.97, troponin I le I personally reviewed his ECG from 12/11/2017 which shows vels negative x3, BNP 189, hemoglobin 13.4, platelets 119, TSH 2.155.  I personally reviewed his ECG from 12/11/2017 which shows atrial fibrillation at 120 bpm.  Chest x-ray reports no acute process.  Echocardiogram from 12/12/2017 reports LVEF relatively stable in the range of 45 to 50%, no regional wall motion abnormalities, moderate left atrial enlargement, and PASP 32 mmHg.  Paroxysmal atrial  fibrillation, rhythm persistent at this time and heart rate not optimally controlled as yet.  Plan is to further increase Coreg, he may need addition of digoxin, but would stop diltiazem if possible in light of cardiomyopathy.  We had a detailed discussion about the possibility of pursuing a TEE guided direct current cardioversion, we discussed the risks including stroke and also anticipated benefits of this approach.  He and his wife will discuss the matter further, we will tentatively have procedure scheduled for tomorrow if he agrees.  Question long-term will be how frequently he experiences symptomatic atrial fibrillation, possibility of an outpatient sleep study to assess for OSA, and whether antiarrhythmic therapy may ultimately need to be considered.  With moderate left atrial enlargement, rhythm control strategy may be somewhat difficult.   Satira Sark, M.D., F.A.C.C.

## 2017-12-13 NOTE — Progress Notes (Signed)
PROGRESS NOTE    [name]  CHE:[id number] DOB: 1951-02-14 DOA: 12/11/2017 PCP: Glenda Chroman, MD    Brief Narrative:  67 year old male with history of cardiomyopathy, hypertension, admitted to the hospital with acute onset of palpitations and general fatigue.  Found to be in rapid atrial fibrillation.  Admitted for further rate control.   Assessment & Plan:   Active Problems:   Atrial fibrillation with RVR (HCC)   Chronic systolic CHF (congestive heart failure) (HCC)   HTN (hypertension)   Gout   BPH (benign prostatic hyperplasia)   1. A. fib with RVR.  New onset.  Currently on intravenous diltiazem infusion.  Home dose of Coreg was increased, but heart rate remains uncontrolled.  Further management of heart rate challenging due to soft blood pressures. Since A fib is new, ? If he would be a candidate for DCCV. Will consult cardiology. CHADSVASc score of 4.  Anticoagulated with Xarelto.  TSH normal.  Cardiac enzymes negative.  Echocardiogram as below. 2. Chronic systolic CHF.  Appears compensated.  Repeat echocardiogram shows EF is unchanged from 2017 at 45-50%.  Continue to monitor. 3. Hypertension.  Currently stable.  Antihypertensives currently on hold so as to allow more flexibility with rate control. 4. BPH.  Continue Flomax 5. Gout.  No signs of acute flare.  Continue allopurinol.   DVT prophylaxis: xarelto Code Status: full code Family Communication: discussed with wife at the bedside Disposition Plan: discharge home once improved   Consultants:     Procedures:  Echo: - Left ventricle: EF difficult to estimate due to presence of   atrial fibrillation and variable heart rate. Appears mildly   depressed. The cavity size was normal. Wall thickness was   increased in a pattern of mild LVH. Systolic function was mildly   reduced. The estimated ejection fraction was in the range of 45%   to 50%. Wall motion was normal; there were no regional wall   motion  abnormalities. - Left atrium: The atrium was moderately dilated. - Pulmonary arteries: Systolic pressure was mildly increased. PA    peak pressure: 32 mm Hg (S).  Antimicrobials:      Subjective: Felt some palpitations while sitting in the chair. Denies any shortness of breath. No chest pain  Objective: Vitals:   12/13/17 0600 12/13/17 0735 12/13/17 0800 12/13/17 0900  BP: 113/63 114/79 114/69 109/66  Pulse: 86 93 81 (!) 107  Resp: 17 16 17    Temp:   98.5 F (36.9 C)   TempSrc:   Oral   SpO2: 92% 94% 90%   Weight:      Height:        Intake/Output Summary (Last 24 hours) at 12/13/2017 0945 Last data filed at 12/13/2017 0905 Gross per 24 hour  Intake 2462.67 ml  Output -  Net 2462.67 ml   Filed Weights   12/11/17 1727 12/11/17 1728 12/12/17 0451  Weight: 120.9 kg (266 lb 8.6 oz) 120.9 kg (266 lb 8.6 oz) 120.9 kg (266 lb 8.6 oz)    Examination:  General exam: Alert, awake, oriented x 3 Respiratory system: Clear to auscultation. Respiratory effort normal. Cardiovascular system: irregular. No murmurs, rubs, gallops. Gastrointestinal system: Abdomen is nondistended, soft and nontender. No organomegaly or masses felt. Normal bowel sounds heard. Central nervous system: Alert and oriented. No focal neurological deficits. Extremities: trace to 1+ edema bilaterally Skin: No rashes, lesions or ulcers Psychiatry: Judgement and insight appear normal. Mood & affect appropriate.    Data Reviewed: I have  personally reviewed following labs and imaging studies  CBC: Recent Labs  Lab 12/11/17 1320 12/12/17 0500  WBC 6.8 5.9  HGB 14.0 13.4  HCT 41.3 40.8  MCV 89.0 89.5  PLT 127* 361*   Basic Metabolic Panel: Recent Labs  Lab 12/11/17 1320 12/12/17 0500  NA 141 142  K 4.1 3.7  CL 108 111  CO2 26 21*  GLUCOSE 116* 120*  BUN 16 18  CREATININE 0.95 0.97  CALCIUM 8.7* 8.3*  MG 2.2  --    GFR: Estimated Creatinine Clearance: 100.7 mL/min (by C-G formula based on  SCr of 0.97 mg/dL). Liver Function Tests: No results for input(s): AST, ALT, ALKPHOS, BILITOT, PROT, ALBUMIN in the last 168 hours. No results for input(s): LIPASE, AMYLASE in the last 168 hours. No results for input(s): AMMONIA in the last 168 hours. Coagulation Profile: Recent Labs  Lab 12/11/17 1452  INR 1.08   Cardiac Enzymes: Recent Labs  Lab 12/11/17 1803 12/11/17 2204 12/12/17 0500  TROPONINI <0.03 <0.03 <0.03   BNP (last 3 results) No results for input(s): PROBNP in the last 8760 hours. HbA1C: No results for input(s): HGBA1C in the last 72 hours. CBG: Recent Labs  Lab 12/11/17 1247  GLUCAP 108*   Lipid Profile: No results for input(s): CHOL, HDL, LDLCALC, TRIG, CHOLHDL, LDLDIRECT in the last 72 hours. Thyroid Function Tests: Recent Labs    12/11/17 1320  TSH 2.155   Anemia Panel: No results for input(s): VITAMINB12, FOLATE, FERRITIN, TIBC, IRON, RETICCTPCT in the last 72 hours. Sepsis Labs: No results for input(s): PROCALCITON, LATICACIDVEN in the last 168 hours.  Recent Results (from the past 240 hour(s))  MRSA PCR Screening     Status: None   Collection Time: 12/11/17  4:39 PM  Result Value Ref Range Status   MRSA by PCR NEGATIVE NEGATIVE Final    Comment:        The GeneXpert MRSA Assay (FDA approved for NASAL specimens only), is one component of a comprehensive MRSA colonization surveillance program. It is not intended to diagnose MRSA infection nor to guide or monitor treatment for MRSA infections. Performed at [facility], [address], [place], [place] [address]          Radiology Studies: Dg Chest 2 View  Result Date: 12/11/2017 CLINICAL DATA:  A-fib that started today, SOB EXAM: CHEST - 2 VIEW COMPARISON:  None. FINDINGS: Normal mediastinum and cardiac silhouette. Normal pulmonary vasculature. No evidence of effusion, infiltrate, or pneumothorax. No acute bony abnormality. IMPRESSION: No acute cardiopulmonary process.  Electronically Signed   By: Suzy Bouchard M.D.   On: 12/11/2017 13:58        Scheduled Meds: . allopurinol  300 mg Oral Daily  . carvedilol  6.25 mg Oral BID WC  . rivaroxaban  20 mg Oral Q lunch  . sodium chloride flush  3 mL Intravenous Q12H  . tamsulosin  0.4 mg Oral Daily   Continuous Infusions: . sodium chloride    . diltiazem (CARDIZEM) infusion 5 mg/hr (12/12/17 1722)     LOS: 1 day    Time spent: 1mins    Kathie Dike, MD [facility] Pager [phone]  If 7PM-7AM, please contact night-coverage [email or web address] Password [facility] 12/13/2017, 9:45 AM

## 2017-12-13 NOTE — Consult Note (Addendum)
Cardiology Consultation:   Patient ID: [name]; [id number]; 1951-01-29   Admit date: 12/11/2017 Date of Consult: 12/13/2017  Primary Care Provider: Glenda Chroman, MD Primary Cardiologist: Kate Sable, MD    Patient Profile:   [name] is a 67 y.o. male with a hx of nonischemic cardiomyopathy and hypertension who is being seen today for the evaluation of newly documented persistent atrial fibrillation with RVR at the request of Dr. [name].  History of Present Illness:   [name] has a history of chronic systolic CHF, LVEF 07% with grade 1 DD, moderate LAE 03/2016-presumed nonischemic CM, essential HTN. Last saw Dr. Bronson Ing 03/2017 and doing well. Submaximal GXT 2013 without ischemia.  Patient says Sat when he got up he wasn't feeling well-dizzy, heart racing and a little short of breath. Has never had before. A month ago while waiting in the heat for a concert he became dizzy which resolved with getting inside and having a piece of candy. No palpitations at that time. No history of chest pain. Was walking on treadmill 30 min daily but quite a month ago for no reason. No alcohol, nonsmoker. BP running low so limited with titrating up coreg, still on IV diltiazem 5 mg/hr. Takes norvasc and benicar for HTN at home but not getting here. Echo yest LVEF 45-50% LA moderately dilated.  Past Medical History:  Diagnosis Date  . Arthritis   . Atrial fib/flutter, transient   . BPH (benign prostatic hyperplasia) 12/11/2017  . Chronic back pain    Spinal stenosis  . Essential hypertension   . Gout   . Neuropathy   . Thyroid nodule    Benign    Past Surgical History:  Procedure Laterality Date  . CATARACT EXTRACTION    . COLONOSCOPY  2012  . IRRIGATION AND DEBRIDEMENT SEBACEOUS CYST    . LUMBAR LAMINECTOMY N/A 01/20/2013   Procedure: MICRODISCECTOMY LUMBAR LAMINECTOMY;  Surgeon: Marybelle Killings, MD;  Location: [facility];  Service: Orthopedics;  Laterality: N/A;  L4-5  Decompression  . PILONIDAL CYST DRAINAGE       Home Medications:  Prior to Admission medications   Medication Sig Start Date End Date Taking? Authorizing Provider  allopurinol (ZYLOPRIM) 300 MG tablet Take 300 mg by mouth daily.   Yes [provider]  amLODipine (NORVASC) 10 MG tablet TAKE ONE TABLET BY MOUTH DAILY. 09/06/17  Yes Herminio Commons, MD  Calcium Carb-Cholecalciferol (CALCIUM + D3 PO) Take 1 tablet by mouth daily.   Yes [provider]  carvedilol (COREG) 3.125 MG tablet Take 3.125 mg 2 (two) times daily with a meal by mouth.   Yes [provider]  Cholecalciferol (VITAMIN D3) 2000 UNITS TABS Take 1 tablet by mouth daily.   Yes [provider]  CINNAMON PO Take 350 mg by mouth daily.   Yes [provider]  Coenzyme Q10 200 MG capsule Take 200 mg by mouth daily.   Yes [provider]  Ferrous Fumarate (IRON) 18 MG TBCR Take 1 tablet by mouth daily.   Yes [provider]  fish oil-omega-3 fatty acids 1000 MG capsule Take 830 mg by mouth daily.   Yes [provider]  Glucosamine-Chondroitin (GLUCOSAMINE CHONDR COMPLEX PO) Take by mouth daily. 1500mg /1200mg    Yes [provider]  LUTEIN-ZEAXANTHIN PO Take 4 mg by mouth daily.   Yes [provider]  Multiple Vitamins-Minerals (MULTIVITAMIN PO) Take 1 tablet by mouth daily.   Yes [provider]  olmesartan [facility])  20 MG tablet Take 20 mg daily by mouth. 03/29/17  Yes [provider]  PHOSPHATIDYLCHOLINE PO Take 420 mg by mouth daily.   Yes [provider]  Probiotic Product (PROBIOTIC DAILY PO) Take 1 tablet by mouth daily. 5 billion unit daily   Yes [provider]  RESVERATROL 100 MG CAPS Take 1 capsule by mouth daily.   Yes [provider]  tamsulosin (FLOMAX) 0.4 MG CAPS capsule Take 1 capsule by mouth daily. 08/06/15  Yes [provider]    Inpatient Medications: Scheduled Meds: .  allopurinol  300 mg Oral Daily  . carvedilol  6.25 mg Oral BID WC  . rivaroxaban  20 mg Oral Q lunch  . sodium chloride flush  3 mL Intravenous Q12H  . tamsulosin  0.4 mg Oral Daily   Continuous Infusions: . sodium chloride    . diltiazem (CARDIZEM) infusion 5 mg/hr (12/13/17 1026)   PRN Meds: sodium chloride, acetaminophen, ondansetron (ZOFRAN) IV, sodium chloride flush  Allergies:   No Known Allergies  Social History:   Social History   Socioeconomic History  . Marital status: Married    Spouse name: Not on file  . Number of children: 3  . Years of education: Not on file  . Highest education level: Not on file  Occupational History  . Not on file  Social Needs  . Financial resource strain: Not on file  . Food insecurity:    Worry: Not on file    Inability: Not on file  . Transportation needs:    Medical: Not on file    Non-medical: Not on file  Tobacco Use  . Smoking status: Never Smoker  . Smokeless tobacco: Never Used  Substance and Sexual Activity  . Alcohol use: No    Alcohol/week: 0.0 oz  . Drug use: No  . Sexual activity: Not on file  Lifestyle  . Physical activity:    Days per week: Not on file    Minutes per session: Not on file  . Stress: Not on file  Relationships  . Social connections:    Talks on phone: Not on file    Gets together: Not on file    Attends religious service: Not on file    Active member of club or organization: Not on file    Attends meetings of clubs or organizations: Not on file    Relationship status: Not on file  . Intimate partner violence:    Fear of current or ex partner: Not on file    Emotionally abused: Not on file    Physically abused: Not on file    Forced sexual activity: Not on file  Other Topics Concern  . Not on file  Social History Narrative  . Not on file    Family History:    Family History  Problem Relation Age of Onset  . Lung cancer Father   . Cancer - Lung Father   . Atrial fibrillation Mother    . Dementia Mother   . Transient ischemic attack Mother   . Hypertension Unknown   . Liver cancer Brother      ROS:  Please see the history of present illness.  Review of Systems  Constitution: Negative.  HENT: Negative.   Cardiovascular: Positive for palpitations.  Respiratory: Positive for shortness of breath.   Endocrine: Negative.   Hematologic/Lymphatic: Negative.   Musculoskeletal: Negative.   Gastrointestinal: Negative.   Genitourinary: Negative.   Neurological: Positive for weakness.    All  other ROS reviewed and negative.     Physical Exam/Data:   Vitals:   12/13/17 1000 12/13/17 1030 12/13/17 1100 12/13/17 1200  BP: 113/69 106/67 112/70   Pulse: 95 (!) 111 100   Resp: 19 (!) 26 17   Temp:    98.8 F (37.1 C)  TempSrc:    Oral  SpO2: 95% 95% 97%   Weight:      Height:        Intake/Output Summary (Last 24 hours) at 12/13/2017 1210 Last data filed at 12/13/2017 0905 Gross per 24 hour  Intake 2462.67 ml  Output -  Net 2462.67 ml   Filed Weights   12/11/17 1727 12/11/17 1728 12/12/17 0451  Weight: 266 lb 8.6 oz (120.9 kg) 266 lb 8.6 oz (120.9 kg) 266 lb 8.6 oz (120.9 kg)   Body mass index is 34.22 kg/m.   General: Obese male, in no acute distress HEENT: normal Lymph: no adenopathy Neck: no JVD Endocrine:  No thryomegaly Vascular: No carotid bruits; FA pulses 2+ bilaterally without bruits  Cardiac:  Irreg irreg; 2/6 sys murmur LSB Lungs:  clear to auscultation bilaterally, no wheezing, rhonchi or rales  Abd: soft, nontender, no hepatomegaly  Ext: no edema Musculoskeletal:  No deformities, BUE and BLE strength normal and equal Skin: warm and dry  Neuro:  CNs 2-12 intact, no focal abnormalities noted Psych:  Normal affect   EKG:  The EKG was personally reviewed and demonstrates: Afib-new otherwise no acute change Telemetry:  Telemetry was personally reviewed and demonstrates:  Afib 90-140's  Relevant CV Studies: Echo 12/12/17 Study  Conclusions   - Left ventricle: EF difficult to estimate due to presence of   atrial fibrillation and variable heart rate. Appears mildly   depressed. The cavity size was normal. Wall thickness was   increased in a pattern of mild LVH. Systolic function was mildly   reduced. The estimated ejection fraction was in the range of 45%   to 50%. Wall motion was normal; there were no regional wall   motion abnormalities. - Left atrium: The atrium was moderately dilated. - Pulmonary arteries: Systolic pressure was mildly increased. PA   peak pressure: 32 mm Hg (S).  Laboratory Data:  Chemistry Recent Labs  Lab 12/11/17 1320 12/12/17 0500  NA 141 142  K 4.1 3.7  CL 108 111  CO2 26 21*  GLUCOSE 116* 120*  BUN 16 18  CREATININE 0.95 0.97  CALCIUM 8.7* 8.3*  GFRNONAA >60 >60  GFRAA >60 >60  ANIONGAP 7 10    No results for input(s): PROT, ALBUMIN, AST, ALT, ALKPHOS, BILITOT in the last 168 hours. Hematology Recent Labs  Lab 12/11/17 1320 12/12/17 0500  WBC 6.8 5.9  RBC 4.64 4.56  HGB 14.0 13.4  HCT 41.3 40.8  MCV 89.0 89.5  MCH 30.2 29.4  MCHC 33.9 32.8  RDW 13.6 13.8  PLT 127* 119*   Cardiac Enzymes Recent Labs  Lab 12/11/17 1803 12/11/17 2204 12/12/17 0500  TROPONINI <0.03 <0.03 <0.03   No results for input(s): TROPIPOC in the last 168 hours.  BNP Recent Labs  Lab 12/11/17 1320  BNP 189.0*    DDimer No results for input(s): DDIMER in the last 168 hours.  Radiology/Studies:  Dg Chest 2 View  Result Date: 12/11/2017 CLINICAL DATA:  A-fib that started today, SOB EXAM: CHEST - 2 VIEW COMPARISON:  None. FINDINGS: Normal mediastinum and cardiac silhouette. Normal pulmonary vasculature. No evidence of effusion, infiltrate, or pneumothorax. No acute bony abnormality.  IMPRESSION: No acute cardiopulmonary process. Electronically Signed   By: Suzy Bouchard M.D.   On: 12/11/2017 13:58    Assessment and Plan:   1. Atrial fib with RVR, CHADSVASC=3-age/HTN/CHF, now on  Xarelto per primary team. TSH normal. Will try to increase Coreg and stop IV diltiazem. Will discuss possible TEE DCCV with Dr. Domenic Polite. 2. History of nonischemic CM EF 45-50% 3. HTN was on norvasc and benicar but on hold b/c of low BP 4. Gout history   For questions or updates, please contact [place] Please consult [email or web address] for contact info under Cardiology/STEMI.   Signed, Ermalinda Barrios, PA-C 12/13/2017 12:10 PM   Attending note:  Patient seen and examined.  I reviewed his records and discussed the case with Ms. Bonnell Public PA-C.  Mr. Steely presents to the hospital with newly persisting palpitations associated with mild shortness of breath and lightheadedness.  He was diagnosed with rapid atrial fibrillation and admitted to the hospital for further evaluation.  CHADSVASC score is 3.  He does describe brief, limited episodes of palpitations in the past, possibly has paroxysmal atrial fibrillation.  He was initially placed on intravenous heparin and ultimately transitioned to Xarelto by primary team.  Heart rate is in the 100-120 range on combination of Coreg and intravenous diltiazem.  Baseline blood pressure has limited upward titration of medical therapy somewhat.  On examination he is in no distress, sitting in bedside chair.  Lungs are clear without labored breathing.  Cardiac exam reveals irregularly irregular rhythm with 2/6 systolic murmur.  No carotid bruits.  Lab work reveals potassium 3.7, BUN 18, creatinine 0.97, troponin I le I personally reviewed his ECG from 12/11/2017 which shows vels negative x3, BNP 189, hemoglobin 13.4, platelets 119, TSH 2.155.  I personally reviewed his ECG from 12/11/2017 which shows atrial fibrillation at 120 bpm.  Chest x-ray reports no acute process.  Echocardiogram from 12/12/2017 reports LVEF relatively stable in the range of 45 to 50%, no regional wall motion abnormalities, moderate left atrial enlargement, and PASP 32 mmHg.  Paroxysmal atrial  fibrillation, rhythm persistent at this time and heart rate not optimally controlled as yet.  Plan is to further increase Coreg, he may need addition of digoxin, but would stop diltiazem if possible in light of cardiomyopathy.  We had a detailed discussion about the possibility of pursuing a TEE guided direct current cardioversion, we discussed the risks including stroke and also anticipated benefits of this approach.  He and his wife will discuss the matter further, we will tentatively have procedure scheduled for tomorrow if he agrees.  Question long-term will be how frequently he experiences symptomatic atrial fibrillation, possibility of an outpatient sleep study to assess for OSA, and whether antiarrhythmic therapy may ultimately need to be considered.  With moderate left atrial enlargement, rhythm control strategy may be somewhat difficult.   Satira Sark, M.D., F.A.C.C.

## 2017-12-14 ENCOUNTER — Inpatient Hospital Stay (HOSPITAL_COMMUNITY): Payer: Medicare Other | Admitting: Anesthesiology

## 2017-12-14 ENCOUNTER — Emergency Department (HOSPITAL_COMMUNITY)
Admission: EM | Admit: 2017-12-14 | Discharge: 2017-12-15 | Disposition: A | Discharge status: Home / Self Care | Payer: Medicare Other | Attending: Emergency Medicine | Admitting: Emergency Medicine

## 2017-12-14 ENCOUNTER — Inpatient Hospital Stay (HOSPITAL_COMMUNITY): Payer: Medicare Other

## 2017-12-14 ENCOUNTER — Encounter (HOSPITAL_COMMUNITY): Payer: Self-pay

## 2017-12-14 ENCOUNTER — Other Ambulatory Visit: Payer: Self-pay

## 2017-12-14 ENCOUNTER — Encounter (HOSPITAL_COMMUNITY): Payer: Self-pay | Admitting: Emergency Medicine

## 2017-12-14 ENCOUNTER — Emergency Department (HOSPITAL_COMMUNITY): Payer: Medicare Other

## 2017-12-14 ENCOUNTER — Encounter (HOSPITAL_COMMUNITY)
Admission: EM | Disposition: A | Discharge status: Home / Self Care | Payer: Self-pay | Source: Home / Self Care | Attending: Internal Medicine

## 2017-12-14 DIAGNOSIS — J181 Lobar pneumonia, unspecified organism: Secondary | ICD-10-CM | POA: Insufficient documentation

## 2017-12-14 DIAGNOSIS — I10 Essential (primary) hypertension: Secondary | ICD-10-CM | POA: Diagnosis not present

## 2017-12-14 DIAGNOSIS — I2510 Atherosclerotic heart disease of native coronary artery without angina pectoris: Secondary | ICD-10-CM | POA: Insufficient documentation

## 2017-12-14 DIAGNOSIS — Z7901 Long term (current) use of anticoagulants: Secondary | ICD-10-CM | POA: Diagnosis not present

## 2017-12-14 DIAGNOSIS — Z79899 Other long term (current) drug therapy: Secondary | ICD-10-CM | POA: Insufficient documentation

## 2017-12-14 DIAGNOSIS — D649 Anemia, unspecified: Secondary | ICD-10-CM | POA: Diagnosis not present

## 2017-12-14 DIAGNOSIS — I5020 Unspecified systolic (congestive) heart failure: Secondary | ICD-10-CM | POA: Insufficient documentation

## 2017-12-14 DIAGNOSIS — I110 Hypertensive heart disease with heart failure: Secondary | ICD-10-CM | POA: Diagnosis not present

## 2017-12-14 DIAGNOSIS — I4891 Unspecified atrial fibrillation: Secondary | ICD-10-CM | POA: Diagnosis not present

## 2017-12-14 DIAGNOSIS — D696 Thrombocytopenia, unspecified: Secondary | ICD-10-CM | POA: Insufficient documentation

## 2017-12-14 DIAGNOSIS — J189 Pneumonia, unspecified organism: Secondary | ICD-10-CM

## 2017-12-14 DIAGNOSIS — I509 Heart failure, unspecified: Secondary | ICD-10-CM | POA: Diagnosis not present

## 2017-12-14 DIAGNOSIS — I340 Nonrheumatic mitral (valve) insufficiency: Secondary | ICD-10-CM

## 2017-12-14 DIAGNOSIS — R05 Cough: Secondary | ICD-10-CM | POA: Diagnosis not present

## 2017-12-14 HISTORY — PX: CARDIOVERSION: SHX1299

## 2017-12-14 HISTORY — PX: TEE WITHOUT CARDIOVERSION: SHX5443

## 2017-12-14 LAB — CBC WITH DIFFERENTIAL/PLATELET
BASOS ABS: 0 10*3/uL (ref 0.0–0.1)
BASOS PCT: 0 %
EOS ABS: 0.1 10*3/uL (ref 0.0–0.7)
EOS PCT: 1 %
HCT: 37.2 % — ABNORMAL LOW (ref 39.0–52.0)
Hemoglobin: 12.1 g/dL — ABNORMAL LOW (ref 13.0–17.0)
Lymphocytes Relative: 21 %
Lymphs Abs: 1.2 10*3/uL (ref 0.7–4.0)
MCH: 29.1 pg (ref 26.0–34.0)
MCHC: 32.5 g/dL (ref 30.0–36.0)
MCV: 89.4 fL (ref 78.0–100.0)
Monocytes Absolute: 1.2 10*3/uL — ABNORMAL HIGH (ref 0.1–1.0)
Monocytes Relative: 20 %
Neutro Abs: 3.3 10*3/uL (ref 1.7–7.7)
Neutrophils Relative %: 58 %
PLATELETS: 103 10*3/uL — AB (ref 150–400)
RBC: 4.16 MIL/uL — AB (ref 4.22–5.81)
RDW: 13.8 % (ref 11.5–15.5)
WBC: 5.8 10*3/uL (ref 4.0–10.5)

## 2017-12-14 LAB — BASIC METABOLIC PANEL
ANION GAP: 5 (ref 5–15)
BUN: 18 mg/dL (ref 8–23)
CO2: 24 mmol/L (ref 22–32)
Calcium: 8 mg/dL — ABNORMAL LOW (ref 8.9–10.3)
Chloride: 112 mmol/L — ABNORMAL HIGH (ref 98–111)
Creatinine, Ser: 0.92 mg/dL (ref 0.61–1.24)
Glucose, Bld: 122 mg/dL — ABNORMAL HIGH (ref 70–99)
Potassium: 3.5 mmol/L (ref 3.5–5.1)
SODIUM: 141 mmol/L (ref 135–145)

## 2017-12-14 SURGERY — ECHOCARDIOGRAM, TRANSESOPHAGEAL
Anesthesia: Monitor Anesthesia Care

## 2017-12-14 MED ORDER — MIDAZOLAM HCL 5 MG/5ML IJ SOLN
INTRAMUSCULAR | Status: DC | PRN
  Administered 2017-12-14: 2 mg via INTRAVENOUS

## 2017-12-14 MED ORDER — RIVAROXABAN 20 MG PO TABS: 20.0000 mg | ORAL_TABLET | Freq: Every day | ORAL | 1 refills | Status: DC

## 2017-12-14 MED ORDER — CARVEDILOL 12.5 MG PO TABS: 12.5000 mg | ORAL_TABLET | Freq: Two times a day (BID) | ORAL | 1 refills | Status: DC

## 2017-12-14 MED ORDER — MIDAZOLAM HCL 2 MG/2ML IJ SOLN
INTRAMUSCULAR | Status: AC
  Filled 2017-12-14: qty 2

## 2017-12-14 MED ORDER — ROCURONIUM BROMIDE 50 MG/5ML IV SOLN
INTRAVENOUS | Status: AC
  Filled 2017-12-14: qty 1

## 2017-12-14 MED ORDER — ATROPINE SULFATE 1 MG/ML IJ SOLN
INTRAMUSCULAR | Status: AC
  Filled 2017-12-14: qty 1

## 2017-12-14 MED ORDER — SODIUM CHLORIDE 0.9 % IJ SOLN
INTRAMUSCULAR | Status: AC
  Filled 2017-12-14: qty 10

## 2017-12-14 MED ORDER — PROPOFOL 500 MG/50ML IV EMUL
INTRAVENOUS | Status: DC | PRN
  Administered 2017-12-14: 125 ug/kg/min via INTRAVENOUS

## 2017-12-14 MED ORDER — PROPOFOL 10 MG/ML IV BOLUS
INTRAVENOUS | Status: DC | PRN
  Administered 2017-12-14 (×3): 30 mg via INTRAVENOUS

## 2017-12-14 MED ORDER — LIDOCAINE VISCOUS HCL 2 % MT SOLN
OROMUCOSAL | Status: AC
  Filled 2017-12-14: qty 15

## 2017-12-14 MED ORDER — LACTATED RINGERS IV SOLN: INTRAVENOUS | Status: DC

## 2017-12-14 MED ORDER — PROPOFOL 10 MG/ML IV BOLUS
INTRAVENOUS | Status: AC
  Filled 2017-12-14: qty 20

## 2017-12-14 MED ORDER — LIDOCAINE HCL (PF) 1 % IJ SOLN
INTRAMUSCULAR | Status: AC
  Filled 2017-12-14: qty 5

## 2017-12-14 MED ORDER — AMOXICILLIN 250 MG PO CAPS
1000.0000 mg | ORAL_CAPSULE | Freq: Once | ORAL | Status: AC
  Administered 2017-12-14: 1000 mg via ORAL
  Filled 2017-12-14: qty 4

## 2017-12-14 MED ORDER — EPHEDRINE SULFATE 50 MG/ML IJ SOLN
INTRAMUSCULAR | Status: AC
  Filled 2017-12-14: qty 1

## 2017-12-14 NOTE — Discharge Summary (Signed)
Physician Discharge Summary  [name]:[id number] DOB: August 29, 1950 DOA: 12/11/2017  PCP: Glenda Chroman, MD  Admit date: 12/11/2017 Discharge date: 12/14/2017  Admitted From: home Disposition:  home  Recommendations for Outpatient Follow-up:  1. Follow up with PCP in 1-2 weeks 2. Please obtain BMP/CBC in one week 3. Follow up scheduled with cardiology in 3 weeks  Discharge Condition:stable CODE STATUS:full code Diet recommendation: heart healthy  Brief/Interim Summary: 67 y/o male with history of cardiomyopathy, HTN who presented to the hospital with palpitations and general fatigue. He was found to be in new onset rapid atrial fibrillation. Patient was admitted to the stepdown unit and started on cardizem infusion. His home dose of carvedilol was increased. Heart rate remained uncontrolled. He was seen by cardiology and underwent DCCV. He tolerated this very well and has maintained sinus rhythm since then. He is anticoagulated with xarelto. Patient is feeling significantly improved. Echo was repeated and EF is unchanged from 2017 at 45-50%. Thyroid studies were normal and cardiac enzymes were negative. Patient has been cleared for discharge home and will follow up with cardiology in the next 3 weeks  Discharge Diagnoses:  Active Problems:   Atrial fibrillation with RVR (HCC)   Chronic systolic CHF (congestive heart failure) (HCC)   HTN (hypertension)   Gout   BPH (benign prostatic hyperplasia)   CAD (coronary artery disease)    Discharge Instructions  Discharge Instructions    Amb referral to [facility]   Complete by:  As directed    Diet - low sodium heart healthy   Complete by:  As directed    Increase activity slowly   Complete by:  As directed      Allergies as of 12/14/2017   No Known Allergies     Medication List    STOP taking these medications   amLODipine 10 MG tablet Commonly known as:  NORVASC   olmesartan 20 MG tablet Commonly known as:   BENICAR     TAKE these medications   allopurinol 300 MG tablet Commonly known as:  ZYLOPRIM Take 300 mg by mouth daily.   CALCIUM + D3 PO Take 1 tablet by mouth daily.   carvedilol 12.5 MG tablet Commonly known as:  COREG Take 1 tablet (12.5 mg total) by mouth 2 (two) times daily with a meal. What changed:    medication strength  how much to take   CINNAMON PO Take 350 mg by mouth daily.   Coenzyme Q10 200 MG capsule Take 200 mg by mouth daily.   fish oil-omega-3 fatty acids 1000 MG capsule Take 830 mg by mouth daily.   GLUCOSAMINE CHONDR COMPLEX PO Take by mouth daily. 1500mg /1200mg    Iron 18 MG Tbcr Take 1 tablet by mouth daily.   LUTEIN-ZEAXANTHIN PO Take 4 mg by mouth daily.   MULTIVITAMIN PO Take 1 tablet by mouth daily.   PHOSPHATIDYLCHOLINE PO Take 420 mg by mouth daily.   PROBIOTIC DAILY PO Take 1 tablet by mouth daily. 5 billion unit daily   Resveratrol 100 MG Caps Take 1 capsule by mouth daily.   rivaroxaban 20 MG Tabs tablet Commonly known as:  XARELTO Take 1 tablet (20 mg total) by mouth daily with lunch. Start taking on:  12/15/2017   tamsulosin 0.4 MG Caps capsule Commonly known as:  FLOMAX Take 1 capsule by mouth daily.   Vitamin D3 2000 units Tabs Take 1 tablet by mouth daily.      Follow-up Information    Ahmed Prima,  [place] M, PA-C Follow up on 01/05/2018.   Specialties:  Physician Assistant, Cardiology Why:  [place] on 01/05/2018 at 3:30PM.  Contact information: [address] [place] [address] [phone]          No Known Allergies  Consultations:  cardiology   Procedures/Studies: Dg Chest 2 View  Result Date: 12/11/2017 CLINICAL DATA:  A-fib that started today, SOB EXAM: CHEST - 2 VIEW COMPARISON:  None. FINDINGS: Normal mediastinum and cardiac silhouette. Normal pulmonary vasculature. No evidence of effusion, infiltrate, or pneumothorax. No acute bony abnormality. IMPRESSION: No acute  cardiopulmonary process. Electronically Signed   By: Suzy Bouchard M.D.   On: 12/11/2017 13:58       Subjective: Feeling better, no chest pain or shortness of breath  Discharge Exam: Vitals:   12/14/17 1500 12/14/17 1600  BP: 119/63   Pulse:    Resp: (!) 23 18  Temp:    SpO2:     Vitals:   12/14/17 1215 12/14/17 1400 12/14/17 1500 12/14/17 1600  BP: (!) 85/52 114/60 119/63   Pulse: 75     Resp: 13 16 (!) 23 18  Temp:      TempSrc:      SpO2: 92%     Weight:      Height:        General: Pt is alert, awake, not in acute distress Cardiovascular: RRR, S1/S2 +, no rubs, no gallops Respiratory: CTA bilaterally, no wheezing, no rhonchi Abdominal: Soft, NT, ND, bowel sounds + Extremities: no edema, no cyanosis    The results of significant diagnostics from this hospitalization (including imaging, microbiology, ancillary and laboratory) are listed below for reference.     Microbiology: Recent Results (from the past 240 hour(s))  MRSA PCR Screening     Status: None   Collection Time: 12/11/17  4:39 PM  Result Value Ref Range Status   MRSA by PCR NEGATIVE NEGATIVE Final    Comment:        The GeneXpert MRSA Assay (FDA approved for NASAL specimens only), is one component of a comprehensive MRSA colonization surveillance program. It is not intended to diagnose MRSA infection nor to guide or monitor treatment for MRSA infections. Performed at [facility], [address], [place], [place] [address]      Labs: BNP (last 3 results) Recent Labs    12/11/17 1320  BNP 182.9*   Basic Metabolic Panel: Recent Labs  Lab 12/11/17 1320 12/12/17 0500 12/14/17 0539  NA 141 142 141  K 4.1 3.7 3.5  CL 108 111 112*  CO2 26 21* 24  GLUCOSE 116* 120* 122*  BUN 16 18 18   CREATININE 0.95 0.97 0.92  CALCIUM 8.7* 8.3* 8.0*  MG 2.2  --   --    Liver Function Tests: No results for input(s): AST, ALT, ALKPHOS, BILITOT, PROT, ALBUMIN in the last 168 hours. No results  for input(s): LIPASE, AMYLASE in the last 168 hours. No results for input(s): AMMONIA in the last 168 hours. CBC: Recent Labs  Lab 12/11/17 1320 12/12/17 0500 12/14/17 0539  WBC 6.8 5.9 5.8  NEUTROABS  --   --  3.3  HGB 14.0 13.4 12.1*  HCT 41.3 40.8 37.2*  MCV 89.0 89.5 89.4  PLT 127* 119* 103*   Cardiac Enzymes: Recent Labs  Lab 12/11/17 1803 12/11/17 2204 12/12/17 0500  TROPONINI <0.03 <0.03 <0.03   BNP: Invalid input(s): POCBNP CBG: Recent Labs  Lab 12/11/17 1247  GLUCAP 108*   D-Dimer  No results for input(s): DDIMER in the last 72 hours. Hgb A1c No results for input(s): HGBA1C in the last 72 hours. Lipid Profile No results for input(s): CHOL, HDL, LDLCALC, TRIG, CHOLHDL, LDLDIRECT in the last 72 hours. Thyroid function studies No results for input(s): TSH, T4TOTAL, T3FREE, THYROIDAB in the last 72 hours.  Invalid input(s): FREET3 Anemia work up No results for input(s): VITAMINB12, FOLATE, FERRITIN, TIBC, IRON, RETICCTPCT in the last 72 hours. Urinalysis    Component Value Date/Time   COLORURINE YELLOW 12/11/2017 1320   APPEARANCEUR CLEAR 12/11/2017 1320   LABSPEC 1.009 12/11/2017 1320   PHURINE 8.0 12/11/2017 1320   GLUCOSEU NEGATIVE 12/11/2017 1320   HGBUR NEGATIVE 12/11/2017 1320   BILIRUBINUR NEGATIVE 12/11/2017 1320   KETONESUR NEGATIVE 12/11/2017 1320   PROTEINUR NEGATIVE 12/11/2017 1320   NITRITE NEGATIVE 12/11/2017 1320   LEUKOCYTESUR NEGATIVE 12/11/2017 1320   Sepsis Labs Invalid input(s): PROCALCITONIN,  WBC,  LACTICIDVEN Microbiology Recent Results (from the past 240 hour(s))  MRSA PCR Screening     Status: None   Collection Time: 12/11/17  4:39 PM  Result Value Ref Range Status   MRSA by PCR NEGATIVE NEGATIVE Final    Comment:        The GeneXpert MRSA Assay (FDA approved for NASAL specimens only), is one component of a comprehensive MRSA colonization surveillance program. It is not intended to diagnose MRSA infection nor to  guide or monitor treatment for MRSA infections. Performed at [facility], [address], [place], [place] [address]      Time coordinating discharge: 48mins  SIGNED:   Kathie Dike, MD  [facility] 12/14/2017, 4:36 PM Pager   If 7PM-7AM, please contact night-coverage [email or web address] Password [email or web address]

## 2017-12-14 NOTE — Interval H&P Note (Signed)
History and Physical Interval Note:  12/14/2017 10:59 AM  Patient presents for TEE guided cardioversion for management of persistent atrial fibrillation. He received the third total dose of Xarelto this morning and otherwise is on Coreg for heart rate control. Procedure discussed with patient and wife. Informed consent obtained and signed. He is ready to proceed.  Satira Sark, M.D., F.A.C.C.

## 2017-12-14 NOTE — CV Procedure (Signed)
Transesophageal echocardiogram guided cardioversion  Indication: Persistent atrial fibrillation  Description of procedure: Informed consent was obtained and the patient was taken to the PACU.  Timeout was performed.  Anterior and posterior pads were placed in standard fashion and connected to a biphasic defibrillator in anticipation of cardioversion.  Sedation was initiated and managed by the Anesthesia service with use of Versed and propofol.  Prior to cardioversion a transesophageal echocardiogram was performed.  Direct visualization was made of the patient's oropharynx using a laryngoscope by Anesthesia with placement of probe into the esophagus.  Multiplane images were obtained and there were no intracardiac thrombi noted, please refer to full report.  The transesophageal echocardiographic probe was withdrawn and the patient prepared for cardioversion.  With sandbag on the anterior chest pad, a single synchronized, 150 J shock was delivered with successful restoration of sinus rhythm.  Patient remained hemodynamically stable throughout and sinus rhythm was confirmed by follow-up ECG.  There were no immediate complications.  Satira Sark, M.D., F.A.C.C.

## 2017-12-14 NOTE — ED Provider Notes (Signed)
[facility] EMERGENCY DEPARTMENT Provider Note   CSN: [id number] Arrival date & time: 12/14/17  2214     History   Chief Complaint Chief Complaint  Patient presents with  . Fever    HPI [name] is a 67 y.o. male.  The history is provided by the patient.  He has history of hypertension, coronary artery disease, systolic heart failure, paroxysmal atrial fibrillation and was just discharged from the hospital this afternoon following cardioversion for atrial fibrillation.  He had some chills while ambulating in the hospital prior to discharge.  This evening, he felt hot and his wife checked his temperature and found it to be elevated to 101.  He has had no further chills and has not had any sweats.  He has had a cough for the past month which is productive of a small amount of green sputum.  He has not noticed any dyspnea or chest pain.  He denies nausea or vomiting.  Denies any dysuria or urinary urgency or frequency.  He has not taken any antipyretics at home.  Past Medical History:  Diagnosis Date  . Arthritis   . Atrial fib/flutter, transient   . BPH (benign prostatic hyperplasia) 12/11/2017  . Chronic back pain    Spinal stenosis  . Essential hypertension   . Gout   . Neuropathy   . Thyroid nodule    Benign    Patient Active Problem List   Diagnosis Date Noted  . CAD (coronary artery disease)   . Atrial fibrillation with RVR ([place]) 12/11/2017  . Chronic systolic CHF (congestive heart failure) ([place]) 12/11/2017  . HTN (hypertension) 12/11/2017  . Gout 12/11/2017  . BPH (benign prostatic hyperplasia) 12/11/2017  . 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 Numbness 10/31/2015  . Restless leg 10/31/2015  . Polyneuropathy 10/31/2015  . Anemia, iron deficiency 10/31/2015  . HNP (herniated nucleus pulposus), lumbar 01/20/2013    Class: Diagnosis of  . Abnormal echocardiogram 02/23/2012    Past Surgical History:  Procedure Laterality Date  . CATARACT EXTRACTION      . COLONOSCOPY  2012  . IRRIGATION AND DEBRIDEMENT SEBACEOUS CYST    . LUMBAR LAMINECTOMY N/A 01/20/2013   Procedure: MICRODISCECTOMY LUMBAR LAMINECTOMY;  Surgeon: Marybelle Killings, MD;  Location: [facility];  Service: Orthopedics;  Laterality: N/A;  L4-5 Decompression  . PILONIDAL CYST DRAINAGE          Home Medications    Prior to Admission medications   Medication Sig Start Date End Date Taking? Authorizing Provider  allopurinol (ZYLOPRIM) 300 MG tablet Take 300 mg by mouth daily.    [provider]  Calcium Carb-Cholecalciferol (CALCIUM + D3 PO) Take 1 tablet by mouth daily.    [provider]  carvedilol (COREG) 12.5 MG tablet Take 1 tablet (12.5 mg total) by mouth 2 (two) times daily with a meal. 12/14/17   Kathie Dike, MD  Cholecalciferol (VITAMIN D3) 2000 UNITS TABS Take 1 tablet by mouth daily.    [provider]  CINNAMON PO Take 350 mg by mouth daily.    [provider]  Coenzyme Q10 200 MG capsule Take 200 mg by mouth daily.    [provider]  Ferrous Fumarate (IRON) 18 MG TBCR Take 1 tablet by mouth daily.    [provider]  fish oil-omega-3 fatty acids 1000 MG capsule Take 830 mg by mouth daily.    [provider]  Glucosamine-Chondroitin (GLUCOSAMINE CHONDR COMPLEX PO) Take by mouth daily. 1500mg /1200mg   [provider]  LUTEIN-ZEAXANTHIN PO Take 4 mg by mouth daily.    [provider]  Multiple Vitamins-Minerals (MULTIVITAMIN PO) Take 1 tablet by mouth daily.    [provider]  PHOSPHATIDYLCHOLINE PO Take 420 mg by mouth daily.    [provider]  Probiotic Product (PROBIOTIC DAILY PO) Take 1 tablet by mouth daily. 5 billion unit daily    [provider]  RESVERATROL 100 MG CAPS Take 1 capsule by mouth daily.    [provider]  rivaroxaban (XARELTO) 20 MG TABS tablet Take 1 tablet (20 mg total) by mouth daily with lunch. 12/15/17   Kathie Dike, MD   tamsulosin (FLOMAX) 0.4 MG CAPS capsule Take 1 capsule by mouth daily. 08/06/15   [provider]    Family History Family History  Problem Relation Age of Onset  . Lung cancer Father   . Cancer - Lung Father   . Atrial fibrillation Mother   . Dementia Mother   . Transient ischemic attack Mother   . Hypertension Unknown   . Liver cancer Brother     Social History Social History   Tobacco Use  . Smoking status: Never Smoker  . Smokeless tobacco: Never Used  Substance Use Topics  . Alcohol use: No    Alcohol/week: 0.0 oz  . Drug use: No     Allergies   Patient has no known allergies.   Review of Systems Review of Systems  All other systems reviewed and are negative.    Physical Exam Updated Vital Signs BP (!) 133/52 (BP Location: Right Arm)   Pulse 99   Temp 98.8 F (37.1 C) (Oral)   Resp 20   Ht 6\' 2"  (1.88 m)   Wt 127 kg (280 lb)   SpO2 93%   BMI 35.95 kg/m   Physical Exam  Nursing note and vitals reviewed.  67 year old male, resting comfortably and in no acute distress. Vital signs are normal. Oxygen saturation is 93%, which is normal. Head is normocephalic and atraumatic. PERRLA, EOMI. Oropharynx is clear. Neck is nontender and supple without adenopathy or JVD. Back is nontender and there is no CVA tenderness. Lungs have faint rales at the left base.  There are no wheezes or rhonchi. Chest is nontender. Heart has regular rate and rhythm without murmur. Abdomen is soft, flat, nontender without masses or hepatosplenomegaly and peristalsis is normoactive. Extremities have 2+ edema, full range of motion is present. Skin is warm and dry without rash. Neurologic: Mental status is normal, cranial nerves are intact, there are no motor or sensory deficits.  ED Treatments / Results  Labs (all labs ordered are listed, but only abnormal results are displayed) Labs Reviewed  CBC WITH DIFFERENTIAL/PLATELET - Abnormal; Notable for the following  components:      Result Value   Hemoglobin 12.5 (*)    HCT 38.2 (*)    Platelets 117 (*)    Monocytes Absolute 1.3 (*)    All other components within normal limits  BASIC METABOLIC PANEL - Abnormal; Notable for the following components:   Glucose, Bld 137 (*)    Calcium 8.3 (*)    All other components within normal limits  URINALYSIS, ROUTINE W REFLEX MICROSCOPIC    Radiology Dg Chest 2 View  Result Date: 12/14/2017 CLINICAL DATA:  Productive cough. EXAM: CHEST - 2 VIEW COMPARISON:  Radiographs of December 11, 2017. FINDINGS: The heart size and mediastinal contours are within normal limits. No pneumothorax or pleural  effusion is noted. Right lung is clear. Mild left basilar atelectasis or infiltrate is noted. The visualized skeletal structures are unremarkable. IMPRESSION: Mild left basilar atelectasis or infiltrate is noted. Electronically Signed   By: Marijo Conception, M.D.   On: 12/14/2017 23:06   Procedures Procedures  Medications Ordered in ED Medications  amoxicillin (AMOXIL) capsule 1,000 mg (1,000 mg Oral Given 12/14/17 2359)     Initial Impression / Assessment and Plan / ED Course  I have reviewed the triage vital signs and the nursing notes.  Pertinent labs & imaging results that were available during my care of the patient were reviewed by me and considered in my medical decision making (see chart for details).  Fever with x-ray showing early pneumonia.  Temperature is normal here and he is nontoxic in appearance.  Will check screening labs, but I anticipate he should be able to be discharged on oral antibiotics.  Old records are reviewed, confirming hospitalization for atrial fibrillation cardioversion earlier today.  WBC is normal with normal differential.  Mild anemia is present and unchanged.  Mild thrombocytopenia is present up which is also unchanged.  He is felt to be safe for management as an outpatient and is discharged with prescription for amoxicillin.  Final  Clinical Impressions(s) / ED Diagnoses   Final diagnoses:  Community acquired pneumonia of left lower lobe of lung ([place])  Normochromic normocytic anemia  Thrombocytopenia ([place])    ED Discharge Orders        Ordered    amoxicillin (AMOXIL) 500 MG capsule  2 times daily,   Status:  Discontinued     12/15/17 0154    amoxicillin (AMOXIL) 500 MG capsule  2 times daily     01/65/53 7482       Delora Fuel, MD 70/78/67 [phone]

## 2017-12-14 NOTE — Anesthesia Preprocedure Evaluation (Signed)
Anesthesia Evaluation  Patient identified by MRN, date of birth, ID band Patient awake    Reviewed: Allergy & Precautions, H&P , NPO status , Patient's Chart, lab work & pertinent test results  Airway Mallampati: III  TM Distance: >3 FB Neck ROM: full    Dental no notable dental hx.    Pulmonary neg pulmonary ROS,    Pulmonary exam normal breath sounds clear to auscultation       Cardiovascular Exercise Tolerance: Good hypertension, + CAD and +CHF  negative cardio ROS  + dysrhythmias Atrial Fibrillation  Rhythm:regular Rate:Normal     Neuro/Psych  Neuromuscular disease negative neurological ROS  negative psych ROS   GI/Hepatic negative GI ROS, Neg liver ROS,   Endo/Other  negative endocrine ROS  Renal/GU negative Renal ROS  negative genitourinary   Musculoskeletal   Abdominal   Peds  Hematology negative hematology ROS (+) anemia ,   Anesthesia Other Findings   Reproductive/Obstetrics negative OB ROS                             Anesthesia Physical Anesthesia Plan  ASA: III  Anesthesia Plan: MAC   Post-op Pain Management:    Induction:   PONV Risk Score and Plan:   Airway Management Planned:   Additional Equipment:   Intra-op Plan:   Post-operative Plan:   Informed Consent: I have reviewed the patients History and Physical, chart, labs and discussed the procedure including the risks, benefits and alternatives for the proposed anesthesia with the patient or authorized representative who has indicated his/her understanding and acceptance.   Dental Advisory Given  Plan Discussed with: CRNA  Anesthesia Plan Comments:         Anesthesia Quick Evaluation

## 2017-12-14 NOTE — Progress Notes (Signed)
*  PRELIMINARY RESULTS*Echocardiogram Echocardiogram Transesophageal has been performed.  [name] 12/14/2017, 12:13 PM

## 2017-12-14 NOTE — Transfer of Care (Signed)
Immediate Anesthesia Transfer of Care Note  Patient: [name]  Procedure(s) Performed: TRANSESOPHAGEAL ECHOCARDIOGRAM (TEE) WITH PROPOFOL (N/A ) CARDIOVERSION (N/A )  Patient Location: PACU  Anesthesia Type:MAC  Level of Consciousness: awake, oriented and patient cooperative  Airway & Oxygen Therapy: Patient Spontanous Breathing and Patient connected to nasal cannula oxygen  Post-op Assessment: Report given to RN and Post -op Vital signs reviewed and stable  Post vital signs: Reviewed and stable  Last Vitals:  Vitals Value Taken Time  BP    Temp    Pulse    Resp    SpO2      Last Pain:  Vitals:   12/14/17 0936  TempSrc: Oral  PainSc: 0-No pain         Complications: No apparent anesthesia complications

## 2017-12-14 NOTE — Progress Notes (Signed)
Electrical Cardioversion Procedure Note SUNDEEP DESTIN [id number] 03-02-1951  Procedure: Electrical Cardioversion Indications:  Persistent atrial fibrillation  Procedure Details Consent: Yes Time Out: Verified patient identification, verified procedure, site/side was marked, verified correct patient position, special equipment/implants available, medications/allergies/relevent history reviewed, required imaging and test results available.   Time out 1119 Patient placed on cardiac monitor, pulse oximetry, supplemental oxygen as necessary.  Sedation given: yes Pacer pads placed yes Cardioverted 1 time(s).  Cardioverted at 150 joules Evaluation Findings: Post procedure EKG shows: Normal Simus Rhythm Complications:None Patient did tolerate procedure well.   Hillery Jacks 12/14/2017, 12:23 PM

## 2017-12-14 NOTE — Anesthesia Postprocedure Evaluation (Signed)
Anesthesia Post Note  Patient: EBAN WEICK  Procedure(s) Performed: TRANSESOPHAGEAL ECHOCARDIOGRAM (TEE) WITH PROPOFOL (N/A ) CARDIOVERSION (N/A )  Patient location during evaluation: PACU Anesthesia Type: MAC Level of consciousness: awake and alert and oriented Pain management: pain level controlled Vital Signs Assessment: post-procedure vital signs reviewed and stable Respiratory status: spontaneous breathing Cardiovascular status: stable Postop Assessment: no apparent nausea or vomiting Anesthetic complications: no     Last Vitals:  Vitals:   12/14/17 0900 12/14/17 0936  BP:  (!) 106/55  Pulse:  (!) 112  Resp: 17 12  Temp:  36.7 C  SpO2:  94%    Last Pain:  Vitals:   12/14/17 0936  TempSrc: Oral  PainSc: 0-No pain                 ADAMS, AMY A

## 2017-12-14 NOTE — ED Triage Notes (Signed)
Pt states he was discharged from upstairs today and since then has developed fever and increased generalized weakness. Pt states he was cardioverted this am.

## 2017-12-14 NOTE — Progress Notes (Signed)
Progress Note  Patient Name: [name] Date of Encounter: 12/14/2017  Primary Cardiologist: Dr. Kate Sable  Subjective   No chest pain or palpitations.  No spontaneous bleeding.  Inpatient Medications    Scheduled Meds: . allopurinol  300 mg Oral Daily  . carvedilol  12.5 mg Oral BID WC  . rivaroxaban  20 mg Oral Q lunch  . sodium chloride flush  3 mL Intravenous Q12H  . tamsulosin  0.4 mg Oral Daily   Continuous Infusions: . sodium chloride    . sodium chloride    . diltiazem (CARDIZEM) infusion Stopped (12/14/17 0745)   PRN Meds: acetaminophen, guaiFENesin-dextromethorphan, ondansetron (ZOFRAN) IV, sodium chloride flush   Vital Signs    Vitals:   12/14/17 0500 12/14/17 0600 12/14/17 0700 12/14/17 0744  BP: (!) 100/49 95/62 (!) 89/48 103/69  Pulse: 82 82 68 (!) 108  Resp: 15 (!) 22 15   Temp:      TempSrc:      SpO2: 93% 94% 92%   Weight:      Height:        Intake/Output Summary (Last 24 hours) at 12/14/2017 0850 Last data filed at 12/14/2017 0300 Gross per 24 hour  Intake 628.42 ml  Output -  Net 628.42 ml   Filed Weights   12/11/17 1728 12/12/17 0451 12/14/17 0444  Weight: 266 lb 8.6 oz (120.9 kg) 266 lb 8.6 oz (120.9 kg) 281 lb 12 oz (127.8 kg)    Telemetry    Atrial fibrillation.  Personally reviewed.  ECG    Tracing from 12/14/2017 shows atrial fibrillation.  Personally reviewed.  Physical Exam   GEN:  Obese male.  No acute distress.   Neck: No JVD. Cardiac:  Irregularly irregular, no gallop.  Respiratory: Nonlabored. Clear to auscultation bilaterally. GI: Soft, nontender, bowel sounds present. MS: No edema; No deformity. Neuro:  Nonfocal. Psych: Alert and oriented x 3. Normal affect.  Labs    Chemistry Recent Labs  Lab 12/11/17 1320 12/12/17 0500 12/14/17 0539  NA 141 142 141  K 4.1 3.7 3.5  CL 108 111 112*  CO2 26 21* 24  GLUCOSE 116* 120* 122*  BUN 16 18 18   CREATININE 0.95 0.97 0.92  CALCIUM 8.7* 8.3*  8.0*  GFRNONAA >60 >60 >60  GFRAA >60 >60 >60  ANIONGAP 7 10 5      Hematology Recent Labs  Lab 12/11/17 1320 12/12/17 0500 12/14/17 0539  WBC 6.8 5.9 5.8  RBC 4.64 4.56 4.16*  HGB 14.0 13.4 12.1*  HCT 41.3 40.8 37.2*  MCV 89.0 89.5 89.4  MCH 30.2 29.4 29.1  MCHC 33.9 32.8 32.5  RDW 13.6 13.8 13.8  PLT 127* 119* 103*    Cardiac Enzymes Recent Labs  Lab 12/11/17 1803 12/11/17 2204 12/12/17 0500  TROPONINI <0.03 <0.03 <0.03   No results for input(s): TROPIPOC in the last 168 hours.   BNP Recent Labs  Lab 12/11/17 1320  BNP 189.0*     Radiology    No results found.  Cardiac Studies   Echocardiogram 12/12/2017: Study Conclusions  - Left ventricle: EF difficult to estimate due to presence of   atrial fibrillation and variable heart rate. Appears mildly   depressed. The cavity size was normal. Wall thickness was   increased in a pattern of mild LVH. Systolic function was mildly   reduced. The estimated ejection fraction was in the range of 45%   to 50%. Wall motion was normal; there were no regional wall  motion abnormalities. - Left atrium: The atrium was moderately dilated. - Pulmonary arteries: Systolic pressure was mildly increased. PA   peak pressure: 32 mm Hg (S).  Patient Profile     67 y.o. male with a history of nonischemic cardiomyopathy, LVEF 45 to 50%, and hypertension now admitted with newly documented persistent atrial fibrillation with RVR.  CHADSVASC score is 3.  Assessment & Plan    1.  Newly documented persistent atrial fibrillation with CHADSVASC score of 3.  Heart rate control not optimal and current blood pressures have limited up titration of AV nodal blockers.  Coreg has been increased to 12.5 mg twice daily.  Receiving third total dose of Xarelto today.  Has been weaned off of intravenous diltiazem.  2.  Nonischemic cardiomyopathy with LVEF 45 to 50% range.  3.  Essential hypertension by history.  Holding outpatient Norvasc and  Benicar in light of low to low normal blood pressures.  4.  Obesity, question possibility of obstructive sleep apnea.  5.  Thrombocytopenia, current platelet count 103, no spontaneous bleeding.  Did receive heparin initially during hospital stay, but at this point do not suspect HIT based on overall change in platelet count and no other active features.  Discussed with patient and wife.  He has agreed to proceed with TEE guided cardioversion, scheduled for later this morning.  We discussed the risks and benefits.  He is receiving his third dose of Xarelto today.  If he converts successfully to sinus rhythm, consider discharge home later this afternoon with outpatient follow-up.  Signed, Rozann Lesches, MD  12/14/2017, 8:50 AM

## 2017-12-14 NOTE — Discharge Instructions (Signed)

## 2017-12-15 ENCOUNTER — Encounter (HOSPITAL_COMMUNITY): Payer: Self-pay | Admitting: Cardiology

## 2017-12-15 LAB — CBC WITH DIFFERENTIAL/PLATELET
BASOS PCT: 0 %
Basophils Absolute: 0 10*3/uL (ref 0.0–0.1)
Eosinophils Absolute: 0 10*3/uL (ref 0.0–0.7)
Eosinophils Relative: 0 %
HCT: 38.2 % — ABNORMAL LOW (ref 39.0–52.0)
Hemoglobin: 12.5 g/dL — ABNORMAL LOW (ref 13.0–17.0)
Lymphocytes Relative: 11 %
Lymphs Abs: 1 10*3/uL (ref 0.7–4.0)
MCH: 29.2 pg (ref 26.0–34.0)
MCHC: 32.7 g/dL (ref 30.0–36.0)
MCV: 89.3 fL (ref 78.0–100.0)
Monocytes Absolute: 1.3 10*3/uL — ABNORMAL HIGH (ref 0.1–1.0)
Monocytes Relative: 14 %
Neutro Abs: 6.6 10*3/uL (ref 1.7–7.7)
Neutrophils Relative %: 75 %
Platelets: 117 10*3/uL — ABNORMAL LOW (ref 150–400)
RBC: 4.28 MIL/uL (ref 4.22–5.81)
RDW: 13.7 % (ref 11.5–15.5)
WBC: 8.9 10*3/uL (ref 4.0–10.5)

## 2017-12-15 LAB — BASIC METABOLIC PANEL
Anion gap: 6 (ref 5–15)
BUN: 17 mg/dL (ref 8–23)
CALCIUM: 8.3 mg/dL — AB (ref 8.9–10.3)
CO2: 24 mmol/L (ref 22–32)
CREATININE: 0.95 mg/dL (ref 0.61–1.24)
Chloride: 108 mmol/L (ref 98–111)
GLUCOSE: 137 mg/dL — AB (ref 70–99)
Potassium: 3.7 mmol/L (ref 3.5–5.1)
Sodium: 138 mmol/L (ref 135–145)

## 2017-12-15 MED ORDER — AMOXICILLIN 500 MG PO CAPS: 1000.0000 mg | ORAL_CAPSULE | Freq: Two times a day (BID) | ORAL | 0 refills | Status: DC

## 2017-12-15 NOTE — Addendum Note (Signed)
Addendum  created 12/15/17 1752 by Vista Deck, CRNA   Charge Capture section accepted

## 2017-12-15 NOTE — Addendum Note (Signed)
Addendum  created 12/15/17 1753 by Vista Deck, CRNA   Charge Capture section accepted

## 2017-12-15 NOTE — Discharge Instructions (Signed)
Return if symptoms are getting worse. °

## 2017-12-20 DIAGNOSIS — I10 Essential (primary) hypertension: Secondary | ICD-10-CM | POA: Diagnosis not present

## 2017-12-20 DIAGNOSIS — I4891 Unspecified atrial fibrillation: Secondary | ICD-10-CM | POA: Diagnosis not present

## 2017-12-20 DIAGNOSIS — Z09 Encounter for follow-up examination after completed treatment for conditions other than malignant neoplasm: Secondary | ICD-10-CM | POA: Diagnosis not present

## 2017-12-20 DIAGNOSIS — Z299 Encounter for prophylactic measures, unspecified: Secondary | ICD-10-CM | POA: Diagnosis not present

## 2017-12-20 DIAGNOSIS — J189 Pneumonia, unspecified organism: Secondary | ICD-10-CM | POA: Diagnosis not present

## 2017-12-20 DIAGNOSIS — Z6835 Body mass index (BMI) 35.0-35.9, adult: Secondary | ICD-10-CM | POA: Diagnosis not present

## 2018-01-05 ENCOUNTER — Ambulatory Visit (INDEPENDENT_AMBULATORY_CARE_PROVIDER_SITE_OTHER): Payer: Medicare Other | Admitting: Student

## 2018-01-05 ENCOUNTER — Encounter: Payer: Self-pay | Admitting: Student

## 2018-01-05 VITALS — BP 150/84 | HR 70 | Ht 74.0 in | Wt 278.0 lb

## 2018-01-05 DIAGNOSIS — I481 Persistent atrial fibrillation: Secondary | ICD-10-CM | POA: Diagnosis not present

## 2018-01-05 DIAGNOSIS — Z7901 Long term (current) use of anticoagulants: Secondary | ICD-10-CM | POA: Diagnosis not present

## 2018-01-05 DIAGNOSIS — I428 Other cardiomyopathies: Secondary | ICD-10-CM

## 2018-01-05 DIAGNOSIS — I4819 Other persistent atrial fibrillation: Secondary | ICD-10-CM

## 2018-01-05 DIAGNOSIS — I10 Essential (primary) hypertension: Secondary | ICD-10-CM | POA: Diagnosis not present

## 2018-01-05 MED ORDER — OLMESARTAN MEDOXOMIL 20 MG PO TABS: 10.0000 mg | ORAL_TABLET | Freq: Every day | ORAL | 11 refills | Status: DC

## 2018-01-05 NOTE — Patient Instructions (Signed)
Medication Instructions:  Your physician has recommended you make the following change in your medication:  Start Olmasartan ( Benicar) 10 mg Daily    Labwork: NONE  Testing/Procedures: NONE   Follow-Up: Your physician recommends that you schedule a follow-up appointment in: 3 Months    Any Other Special Instructions Will Be Listed Below (If Applicable).     If you need a refill on your cardiac medications before your next appointment, please call your pharmacy.  Thank you for choosing [facility]!

## 2018-01-05 NOTE — Progress Notes (Signed)
Cardiology Office Note    Date:  01/05/2018   ID:  [name], DOB 20-Oct-1950, MRN [id number]  PCP:  Frank Chroman, MD  Cardiologist: Frank Sable, MD    Chief Complaint  Patient presents with  . Hospitalization Follow-up    History of Present Illness:    [name] is a 67 y.o. male with past medical history of presumed nonischemic cardiomyopathy (EF 45% by echocardiogram imaging in 03/2016), HTN, and HLD who presents to the office today for hospital follow-up.  He was recently admitted to [facility] on 12/11/2017 for evaluation of worsening dyspnea on exertion.  He was found to be in atrial fibrillation with RVR upon arrival and was started on IV Cardizem for rate control and Heparin for anticoagulation. Given his cardiomyopathy and difficult to control rate, a TEE-guided cardioversion was recommended. Following 3 doses of Xarelto, this was performed by Dr. Domenic Polite on 12/14/2017 and he returned to normal sinus rhythm with administration of a single synchronized shock at 150 J. He was discharged on Carvedilol 12.5 mg twice daily and Xarelto 20 mg daily.  He did present back to [facility] ED the night following discharge for evaluation of worsening fever and chills.  Febrile up to 101 when checked at home but this was at 98.8 while in the ED. CXR was obtained and felt to be most consistent with an early developing pneumonia, therefore was discharged on Amoxicillin 500 mg twice daily  In talking with the patient and his wife today, he reports initially having fatigue following his hospitalization and was unsure if this was due to his atrial fibrillation, PNA, or deconditioning. Symptoms have improved over the past 2 weeks. He denies any recurrent palpitations. No recent chest pain, dyspnea on exertion, orthopnea, PND, or dizziness. Says he does have chronic edema which is unchanged.   He has been experiencing more headaches since discharge and his wife who is a retired  Marine scientist has been following his vitals signs closely. HR has been well-controlled in the 50's to 60's but SBP has been trending upwards, in the 150's at times. Has also been as low as 109/54. At 150/84 during today's visit.    He reports good compliance with Xarelto. Denies missing any doses.    Past Medical History:  Diagnosis Date  . Arthritis   . Atrial fib/flutter, transient    a. s/p TEE-guided DCCV on 12/14/2017 with return to NSR.   [name] BPH (benign prostatic hyperplasia) 12/11/2017  . Chronic back pain    Spinal stenosis  . Essential hypertension   . Gout   . Neuropathy   . Thyroid nodule    Benign    Past Surgical History:  Procedure Laterality Date  . CARDIOVERSION N/A 12/14/2017   Procedure: CARDIOVERSION;  Surgeon: Satira Sark, MD;  Location: [facility];  Service: Cardiovascular;  Laterality: N/A;  . CATARACT EXTRACTION    . COLONOSCOPY  2012  . IRRIGATION AND DEBRIDEMENT SEBACEOUS CYST    . LUMBAR LAMINECTOMY N/A 01/20/2013   Procedure: MICRODISCECTOMY LUMBAR LAMINECTOMY;  Surgeon: Marybelle Killings, MD;  Location: [facility];  Service: Orthopedics;  Laterality: N/A;  L4-5 Decompression  . PILONIDAL CYST DRAINAGE    . TEE WITHOUT CARDIOVERSION N/A 12/14/2017   Procedure: TRANSESOPHAGEAL ECHOCARDIOGRAM (TEE) WITH PROPOFOL;  Surgeon: Satira Sark, MD;  Location: [facility];  Service: Cardiovascular;  Laterality: N/A;    Current Medications: Outpatient Medications Prior to Visit  Medication Sig Dispense Refill  . allopurinol (  ZYLOPRIM) 300 MG tablet Take 300 mg by mouth daily.    [name] amoxicillin (AMOXIL) 500 MG capsule Take 2 capsules (1,000 mg total) by mouth 2 (two) times daily. 40 capsule 0  . Calcium Carb-Cholecalciferol (CALCIUM + D3 PO) Take 1 tablet by mouth daily.    . carvedilol (COREG) 12.5 MG tablet Take 1 tablet (12.5 mg total) by mouth 2 (two) times daily with a meal. 60 tablet 1  . Cholecalciferol (VITAMIN D3) 2000 UNITS TABS Take 1 tablet by mouth daily.    [name] CINNAMON  PO Take 350 mg by mouth daily.    . Coenzyme Q10 200 MG capsule Take 200 mg by mouth daily.    . Ferrous Fumarate (IRON) 18 MG TBCR Take 1 tablet by mouth daily.    . fish oil-omega-3 fatty acids 1000 MG capsule Take 830 mg by mouth daily.    . Glucosamine-Chondroitin (GLUCOSAMINE CHONDR COMPLEX PO) Take by mouth daily. 1500mg /1200mg     . LUTEIN-ZEAXANTHIN PO Take 4 mg by mouth daily.    . Multiple Vitamins-Minerals (MULTIVITAMIN PO) Take 1 tablet by mouth daily.    [name] PHOSPHATIDYLCHOLINE PO Take 420 mg by mouth daily.    . Probiotic Product (PROBIOTIC DAILY PO) Take 1 tablet by mouth daily. 5 billion unit daily    . RESVERATROL 100 MG CAPS Take 1 capsule by mouth daily.    . rivaroxaban (XARELTO) 20 MG TABS tablet Take 1 tablet (20 mg total) by mouth daily with lunch. 30 tablet 1  . tamsulosin (FLOMAX) 0.4 MG CAPS capsule Take 1 capsule by mouth daily.  5   No facility-administered medications prior to visit.      Allergies:   Patient has no known allergies.   Social History   Socioeconomic History  . Marital status: Married    Spouse name: Not on file  . Number of children: 3  . Years of education: Not on file  . Highest education level: Not on file  Occupational History  . Not on file  Social Needs  . Financial resource strain: Not on file  . Food insecurity:    Worry: Not on file    Inability: Not on file  . Transportation needs:    Medical: Not on file    Non-medical: Not on file  Tobacco Use  . Smoking status: Never Smoker  . Smokeless tobacco: Never Used  Substance and Sexual Activity  . Alcohol use: No    Alcohol/week: 0.0 standard drinks  . Drug use: No  . Sexual activity: Not on file  Lifestyle  . Physical activity:    Days per week: Not on file    Minutes per session: Not on file  . Stress: Not on file  Relationships  . Social connections:    Talks on phone: Not on file    Gets together: Not on file    Attends religious service: Not on file    Active  member of club or organization: Not on file    Attends meetings of clubs or organizations: Not on file    Relationship status: Not on file  Other Topics Concern  . Not on file  Social History Narrative  . Not on file     Family History:  The patient's family history includes Atrial fibrillation in his mother; Cancer - Lung in his father; Dementia in his mother; Hypertension in his unknown relative; Liver cancer in his brother; Lung cancer in his father; Transient ischemic attack in his mother.  Review of Systems:   Please see the history of present illness.     General:  No chills, fever, night sweats or weight changes.  Cardiovascular:  No chest pain, dyspnea on exertion, orthopnea, palpitations, paroxysmal nocturnal dyspnea. Positive for edema (chronic).  Dermatological: No rash, lesions/masses Respiratory: No cough, dyspnea Urologic: No hematuria, dysuria Abdominal:   No nausea, vomiting, diarrhea, bright red blood per rectum, melena, or hematemesis Neurologic:  No visual changes, wkns, changes in mental status. Positive for headaches.   All other systems reviewed and are otherwise negative except as noted above.   Physical Exam:    VS:  BP (!) 150/84 (BP Location: Right Arm)   Pulse 70   Ht 6\' 2"  (1.88 m)   Wt 278 lb (126.1 kg)   SpO2 96%   BMI 35.69 kg/m    General: Well developed, well nourished Caucasian male appearing in no acute distress. Head: Normocephalic, atraumatic, sclera non-icteric, no xanthomas, nares are without discharge.  Neck: No carotid bruits. JVD not elevated.  Lungs: Respirations regular and unlabored, without wheezes or rales.  Heart: Regular rate and rhythm. No S3 or S4.  No murmur, no rubs, or gallops appreciated. Abdomen: Soft, non-tender, non-distended with normoactive bowel sounds. No hepatomegaly. No rebound/guarding. No obvious abdominal masses. Msk:  Strength and tone appear normal for age. No joint deformities or effusions. Extremities: No  clubbing or cyanosis. 1+ pitting edema up to mid-shins (compression stockings in place).  Distal pedal pulses are 2+ bilaterally. Neuro: Alert and oriented X 3. Moves all extremities spontaneously. No focal deficits noted. Psych:  Responds to questions appropriately with a normal affect. Skin: No rashes or lesions noted  Wt Readings from Last 3 Encounters:  01/05/18 278 lb (126.1 kg)  12/14/17 280 lb (127 kg)  12/14/17 281 lb 12 oz (127.8 kg)     Studies/Labs Reviewed:   EKG:  EKG is ordered today.  The ekg ordered today demonstrates sinus rhythm, heart rate 64, with no acute ST changes when compared to prior tracings.  Recent Labs: 12/11/2017: B Natriuretic Peptide 189.0; Magnesium 2.2; TSH 2.155 12/15/2017: BUN 17; Creatinine, Ser 0.95; Hemoglobin 12.5; Platelets 117; Potassium 3.7; Sodium 138   Lipid Panel No results found for: CHOL, TRIG, HDL, CHOLHDL, VLDL, LDLCALC, LDLDIRECT  Additional studies/ records that were reviewed today include:   Echocardiogram: 12/12/2017 Study Conclusions  - Left ventricle: EF difficult to estimate due to presence of   atrial fibrillation and variable heart rate. Appears mildly   depressed. The cavity size was normal. Wall thickness was   increased in a pattern of mild LVH. Systolic function was mildly   reduced. The estimated ejection fraction was in the range of 45%   to 50%. Wall motion was normal; there were no regional wall   motion abnormalities. - Left atrium: The atrium was moderately dilated. - Pulmonary arteries: Systolic pressure was mildly increased. PA   peak pressure: 32 mm Hg (S).  Assessment:    1. Persistent atrial fibrillation ([facility])   2. Current use of long term anticoagulation   3. Nonischemic cardiomyopathy ([place])   4. Essential hypertension      Plan:   In order of problems listed above:  1. Persistent Atrial Fibrillation/ Use of Long-Term Anticoagulation - recently admitted for evaluation of worsening dyspnea on  exertion and found to be in atrial fibrillation with RVR. Underwent TEE-guided DCCV on 12/14/2017 with return to NSR. Maintaining NSR by examination and EKG today and he denies any recurrent  dyspnea or palpitations.  - he has been experiencing more frequent headaches since hospital discharge. Denies any associated vision changes or dizziness. This could be due to his variable BP readings or possible titration of Coreg dosing. With his elevated BP, will plan to add back Benicar at 10mg  daily (previously on 20mg  daily) and ask him to continue to follow BP in the ambulatory setting. If headaches persist despite well-controlled BP, could decrease Coreg to 6.25mg  BID. If symptoms persist despite medication changes, would recommend he follow-up with his PCP for further evaluation.  - he denies any evidence of active bleeding. Continued compliance with Xarelto strongly encouraged.   2. Nonischemic Cardiomyopathy - most recent echo showed a mildly reduced EF of 45-50% which is similar to prior tracings. He has chronic edema but denies any recent dyspnea on exertion, orthopnea, or PND. Not currently on diuretic therapy as he tries to follow a low-sodium diet and utilizes compression stockings daily.  - continue Coreg 12.5mg  BID and will add back Benicar at a lower dose of 10mg  daily given his variable readings.   3. HTN - BP elevated at 150/84 during today's visit. Has been variable in the ambulatory setting.  - continue Coreg 12.5mg  BID. Will add back Benicar at 10mg  daily (previously on 20mg  daily PTA and may require further titration of this).    Medication Adjustments/Labs and Tests Ordered: Current medicines are reviewed at length with the patient today.  Concerns regarding medicines are outlined above.  Medication changes, Labs and Tests ordered today are listed in the Patient Instructions below. Patient Instructions  Medication Instructions:  Your physician has recommended you make the following change  in your medication:  Start Olmasartan ( Benicar) 10 mg Daily    Labwork: NONE  Testing/Procedures: NONE   Follow-Up: Your physician recommends that you schedule a follow-up appointment in: 3 Months   Any Other Special Instructions Will Be Listed Below (If Applicable).  If you need a refill on your cardiac medications before your next appointment, please call your pharmacy.  Thank you for choosing [facility]!    Signed, Erma Heritage, PA-C  01/05/2018 9:25 PM    [facility] [address] [place], [place] [address] Phone: [phone]

## 2018-01-06 ENCOUNTER — Telehealth: Payer: Self-pay | Admitting: Student

## 2018-01-06 MED ORDER — OLMESARTAN MEDOXOMIL 5 MG PO TABS: 10.0000 mg | ORAL_TABLET | Freq: Every day | ORAL | 1 refills | Status: DC

## 2018-01-06 NOTE — Telephone Encounter (Signed)
Pt is having a hard time cutting his olmesartan (BENICAR) 20 MG tablet [[id number]]  Because they aren't made to where he can cut them easily  Please send in new Rx to [facility] in [place]

## 2018-01-06 NOTE — Telephone Encounter (Signed)
Pt could not cut 20 mg pill to cut equal size to take 10 mg prescribed dose. I e-scribed 5 mg tablets, pt to take 2 tabs daily

## 2018-01-14 ENCOUNTER — Telehealth: Payer: Self-pay | Admitting: Student

## 2018-01-14 DIAGNOSIS — Z79899 Other long term (current) drug therapy: Secondary | ICD-10-CM

## 2018-01-14 NOTE — Telephone Encounter (Signed)
   Please let the patient know that I reviewed his detailed blood pressure log. Can you please verify that he is taking Coreg 12.5 mg twice daily as it is only listed in the AM hours on his blood pressure log. If he has not, please start at 12.5 mg twice daily.  If he is already taking this twice daily, would recommend titration of Benicar to 20mg  daily. Will need a repeat BMET in 2 weeks following titration of this.   Signed, Erma Heritage, PA-C 01/14/2018, 4:28 PM Pager: [phone]

## 2018-01-18 MED ORDER — OLMESARTAN MEDOXOMIL 20 MG PO TABS: 20.0000 mg | ORAL_TABLET | Freq: Every day | ORAL | 11 refills | Status: DC

## 2018-01-18 NOTE — Telephone Encounter (Signed)
Pt notified and voiced understanding. Order placed.  °

## 2018-01-18 NOTE — Telephone Encounter (Signed)
Called pt. Left mgs with wife to call back.

## 2018-01-20 DIAGNOSIS — I10 Essential (primary) hypertension: Secondary | ICD-10-CM | POA: Diagnosis not present

## 2018-02-01 ENCOUNTER — Other Ambulatory Visit (HOSPITAL_COMMUNITY)
Admission: RE | Admit: 2018-02-01 | Discharge: 2018-02-01 | Disposition: A | Discharge status: Home / Self Care | Payer: Medicare Other | Source: Ambulatory Visit | Attending: Student | Admitting: Student

## 2018-02-01 DIAGNOSIS — I4891 Unspecified atrial fibrillation: Secondary | ICD-10-CM | POA: Diagnosis not present

## 2018-02-01 DIAGNOSIS — Z79899 Other long term (current) drug therapy: Secondary | ICD-10-CM | POA: Insufficient documentation

## 2018-02-01 DIAGNOSIS — M79672 Pain in left foot: Secondary | ICD-10-CM | POA: Diagnosis not present

## 2018-02-01 DIAGNOSIS — M7741 Metatarsalgia, right foot: Secondary | ICD-10-CM | POA: Diagnosis not present

## 2018-02-01 DIAGNOSIS — Z299 Encounter for prophylactic measures, unspecified: Secondary | ICD-10-CM | POA: Diagnosis not present

## 2018-02-01 DIAGNOSIS — I10 Essential (primary) hypertension: Secondary | ICD-10-CM | POA: Diagnosis not present

## 2018-02-01 DIAGNOSIS — Z6835 Body mass index (BMI) 35.0-35.9, adult: Secondary | ICD-10-CM | POA: Diagnosis not present

## 2018-02-01 DIAGNOSIS — N400 Enlarged prostate without lower urinary tract symptoms: Secondary | ICD-10-CM | POA: Diagnosis not present

## 2018-02-01 DIAGNOSIS — M79671 Pain in right foot: Secondary | ICD-10-CM | POA: Diagnosis not present

## 2018-02-01 LAB — BASIC METABOLIC PANEL
ANION GAP: 6 (ref 5–15)
BUN: 18 mg/dL (ref 8–23)
CALCIUM: 8.9 mg/dL (ref 8.9–10.3)
CO2: 29 mmol/L (ref 22–32)
Chloride: 107 mmol/L (ref 98–111)
Creatinine, Ser: 0.85 mg/dL (ref 0.61–1.24)
GFR calc Af Amer: >60 mL/min (ref >60)
GLUCOSE: 97 mg/dL (ref 70–99)
POTASSIUM: 4.4 mmol/L (ref 3.5–5.1)
Sodium: 142 mmol/L (ref 135–145)

## 2018-02-09 ENCOUNTER — Telehealth: Payer: Self-pay | Admitting: Student

## 2018-02-09 MED ORDER — RIVAROXABAN 20 MG PO TABS: 20.0000 mg | ORAL_TABLET | Freq: Every day | ORAL | 11 refills | Status: DC

## 2018-02-09 MED ORDER — AMLODIPINE BESYLATE 5 MG PO TABS: 5.0000 mg | ORAL_TABLET | Freq: Every day | ORAL | 4 refills | Status: DC

## 2018-02-09 NOTE — Telephone Encounter (Signed)
   Reviewed patient's BP log. Numbers have improved with titration of Benicar but still remain above goal with SBP mostly in the 150's. He had been on Coreg, Benicar, and Amlodipine prior to admission but Amlodipine was discontinued with titration of Coreg. I am hesitant to further titrate Coreg given his heart rate has been mostly in the 50's to 60's. Called the patient reviewed options with him. Will plan to restart Amlodipine at 5 mg daily. He will continue to keep track of his heart rate and BP at home. This can be further titrated to 10 mg daily pending BP response. He voiced understanding of this and was appreciative of the call.  Signed, Erma Heritage, PA-C 02/09/2018, 4:37 PM Pager: [phone]

## 2018-02-10 ENCOUNTER — Other Ambulatory Visit: Payer: Self-pay | Admitting: *Deleted

## 2018-02-10 MED ORDER — CARVEDILOL 12.5 MG PO TABS: 12.5000 mg | ORAL_TABLET | Freq: Two times a day (BID) | ORAL | 1 refills | Status: DC

## 2018-02-14 ENCOUNTER — Ambulatory Visit (INDEPENDENT_AMBULATORY_CARE_PROVIDER_SITE_OTHER): Payer: Medicare Other | Admitting: *Deleted

## 2018-02-14 VITALS — BP 120/71 | HR 90 | Ht 74.0 in | Wt 277.0 lb

## 2018-02-14 DIAGNOSIS — R002 Palpitations: Secondary | ICD-10-CM | POA: Diagnosis not present

## 2018-02-14 DIAGNOSIS — I4891 Unspecified atrial fibrillation: Secondary | ICD-10-CM

## 2018-02-14 MED ORDER — CARVEDILOL 25 MG PO TABS: 25.0000 mg | ORAL_TABLET | Freq: Two times a day (BID) | ORAL | 60 refills | Status: DC

## 2018-02-14 NOTE — Progress Notes (Signed)
Pt reports waking at 2 am to use the restroom and felt increased palpitations. Pt states that after waking up palpitations have gotten better but are not gone. He denies Chest pain and SOB.

## 2018-02-14 NOTE — Patient Instructions (Signed)
Medication Instructions:  Your physician has recommended you make the following change in your medication:  Hold Norvasc  Increase Coreg to 25 mg Two Times Daily     Labwork: NONE   Testing/Procedures: NONE   Follow-Up: Your physician recommends that you schedule a follow-up appointment as planned    Any Other Special Instructions Will Be Listed Below (If Applicable).  You have been referred to EP for AFib     If you need a refill on your cardiac medications before your next appointment, please call your pharmacy. Thank you for choosing [facility]!

## 2018-02-16 ENCOUNTER — Encounter: Payer: Self-pay | Admitting: Internal Medicine

## 2018-02-16 ENCOUNTER — Ambulatory Visit (INDEPENDENT_AMBULATORY_CARE_PROVIDER_SITE_OTHER): Payer: Medicare Other | Admitting: Internal Medicine

## 2018-02-16 VITALS — BP 132/78 | HR 131 | Ht 74.0 in | Wt 277.0 lb

## 2018-02-16 DIAGNOSIS — I481 Persistent atrial fibrillation: Secondary | ICD-10-CM

## 2018-02-16 DIAGNOSIS — I4819 Other persistent atrial fibrillation: Secondary | ICD-10-CM

## 2018-02-16 NOTE — Patient Instructions (Addendum)
Medication Instructions:  Your physician recommends that you continue on your current medications as directed. Please refer to the Current Medication list given to you today.   Labwork: NONE   Testing/Procedures: NONE   Follow-Up: Your physician recommends that you schedule a follow-up appointment with Dr. Lovena Le   Any Other Special Instructions Will Be Listed Below (If Applicable). Your Doctor would like you to be seen in the hospital for dofetilide dosing.  Take all of your medications on this day including Xarelto.  You will spend 3 days in the Hospital.  An alterative to this medication would be amiodarone.     If you need a refill on your cardiac medications before your next appointment, please call your pharmacy.

## 2018-02-16 NOTE — Progress Notes (Signed)
HPI [name] is referred today by Dr. Jacinta James. He is a pleasant obese 67 yo man with HTN who developed atrial fib several months ago. He underwent TEE guided DCCV and maintained NSR until a few days ago. He has had recurrent atrial fib. He feels palpitations and sob with his atrial fib. He has not had syncope. He denies angina. His EF has been about 45% which preceeds his atrial fib. He has been treated with coreg and an ARB. His ventricular rates in atrial fib have been fast despite his coreg. No Known Allergies   Current Outpatient Medications  Medication Sig Dispense Refill  . allopurinol (ZYLOPRIM) 300 MG tablet Take 300 mg by mouth daily.    . Calcium Carb-Cholecalciferol (CALCIUM + D3 PO) Take 1 tablet by mouth daily.    . carvedilol (COREG) 25 MG tablet Take 1 tablet (25 mg total) by mouth 2 (two) times daily. 60 tablet 60  . Cholecalciferol (VITAMIN D3) 2000 UNITS TABS Take 1 tablet by mouth daily.    [name] CINNAMON PO Take 350 mg by mouth daily.    . Coenzyme Q10 200 MG capsule Take 200 mg by mouth daily.    . Ferrous Fumarate (IRON) 18 MG TBCR Take 1 tablet by mouth daily.    . fish oil-omega-3 fatty acids 1000 MG capsule Take 830 mg by mouth daily.    . Glucosamine-Chondroitin (GLUCOSAMINE CHONDR COMPLEX PO) Take by mouth daily. 1500mg /1200mg     . LUTEIN-ZEAXANTHIN PO Take 4 mg by mouth daily.    . Multiple Vitamins-Minerals (MULTIVITAMIN PO) Take 1 tablet by mouth daily.    [name] olmesartan (BENICAR) 20 MG tablet Take 1 tablet (20 mg total) by mouth daily. 30 tablet 11  . PHOSPHATIDYLCHOLINE PO Take 420 mg by mouth daily.    . Probiotic Product (PROBIOTIC DAILY PO) Take 1 tablet by mouth daily. 5 billion unit daily    . RESVERATROL 100 MG CAPS Take 1 capsule by mouth daily.    . rivaroxaban (XARELTO) 20 MG TABS tablet Take 1 tablet (20 mg total) by mouth daily with lunch. 30 tablet 11  . tamsulosin (FLOMAX) 0.4 MG CAPS capsule Take 1 capsule by mouth daily.  5   No  current facility-administered medications for this visit.      Past Medical History:  Diagnosis Date  . Arthritis   . Atrial fib/flutter, transient    a. s/p TEE-guided DCCV on 12/14/2017 with return to NSR.   [name] BPH (benign prostatic hyperplasia) 12/11/2017  . Chronic back pain    Spinal stenosis  . Essential hypertension   . Gout   . Neuropathy   . Thyroid nodule    Benign    ROS:   All systems reviewed and negative except as noted in the HPI.   Past Surgical History:  Procedure Laterality Date  . CARDIOVERSION N/A 12/14/2017   Procedure: CARDIOVERSION;  Surgeon: Frank Sark, MD;  Location: [facility];  Service: Cardiovascular;  Laterality: N/A;  . CATARACT EXTRACTION    . COLONOSCOPY  2012  . IRRIGATION AND DEBRIDEMENT SEBACEOUS CYST    . LUMBAR LAMINECTOMY N/A 01/20/2013   Procedure: MICRODISCECTOMY LUMBAR LAMINECTOMY;  Surgeon: Frank Killings, MD;  Location: [place];  Service: Orthopedics;  Laterality: N/A;  L4-5 Decompression  . PILONIDAL CYST DRAINAGE    . TEE WITHOUT CARDIOVERSION N/A 12/14/2017   Procedure: TRANSESOPHAGEAL ECHOCARDIOGRAM (TEE) WITH PROPOFOL;  Surgeon: Frank Sark, MD;  Location: [facility];  Service:  Cardiovascular;  Laterality: N/A;     Family History  Problem Relation Age of Onset  . Lung cancer Father   . Cancer - Lung Father   . Atrial fibrillation Mother   . Dementia Mother   . Transient ischemic attack Mother   . Hypertension Unknown   . Liver cancer Brother      Social History   Socioeconomic History  . Marital status: Married    Spouse name: Not on file  . Number of children: 3  . Years of education: Not on file  . Highest education level: Not on file  Occupational History  . Not on file  Social Needs  . Financial resource strain: Not on file  . Food insecurity:    Worry: Not on file    Inability: Not on file  . Transportation needs:    Medical: Not on file    Non-medical: Not on file  Tobacco Use  . Smoking status:  Never Smoker  . Smokeless tobacco: Never Used  Substance and Sexual Activity  . Alcohol use: No    Alcohol/week: 0.0 standard drinks  . Drug use: No  . Sexual activity: Not on file  Lifestyle  . Physical activity:    Days per week: Not on file    Minutes per session: Not on file  . Stress: Not on file  Relationships  . Social connections:    Talks on phone: Not on file    Gets together: Not on file    Attends religious service: Not on file    Active member of club or organization: Not on file    Attends meetings of clubs or organizations: Not on file    Relationship status: Not on file  . Intimate partner violence:    Fear of current or ex partner: Not on file    Emotionally abused: Not on file    Physically abused: Not on file    Forced sexual activity: Not on file  Other Topics Concern  . Not on file  Social History Narrative  . Not on file     BP 132/78 (BP Location: Left Arm)   Pulse (!) 131   Ht 6\' 2"  (1.88 m)   Wt 277 lb (125.6 kg)   SpO2 96%   BMI 35.56 kg/m   Physical Exam:   Obese 67 yo man, well appearing NAD HEENT: Unremarkable Neck:  7 cm JVD, no thyromegally Lymphatics:  No adenopathy Back:  No CVA tenderness Lungs:  Clear HEART:  IRegular rate rhythm, no murmurs, no rubs, no clicks Abd:  soft, positive bowel sounds, no organomegally, no rebound, no guarding Ext:  2 plus pulses, no edema, no cyanosis, no clubbing Skin:  No rashes no nodules Neuro:  CN II through XII intact, motor grossly intact   DEVICE  Normal device function.  See PaceArt for details.   Assess/Plan: 1. Atrial fib - we discussed the rate vs rhythm control in detail. We discussed catheter ablation. After a fairly exhaustive discussion, he has elected to be admitted for the initiation of dofetilide. His QT in NSR was around 420. 2. Obesity - I discussed the importance of weight loss as it pertains to atrial fib.  3. HTN - his blood pressure is controlled. Continue current  medical therapy 4. LV dysfunction - he has class 2 symptoms. He will continue his current meds and maintain a low sodium diet.  [name].D.

## 2018-02-17 ENCOUNTER — Telehealth: Payer: Self-pay | Admitting: Internal Medicine

## 2018-02-17 NOTE — Telephone Encounter (Signed)
Patient will call to check on price of the drug and call back to schedule.

## 2018-02-17 NOTE — Telephone Encounter (Signed)
Spoke with Afib clinic who will schedule with patient.

## 2018-02-17 NOTE — Telephone Encounter (Signed)
Patient notified and voiced understanding.

## 2018-02-17 NOTE — Telephone Encounter (Signed)
Wanted to know when he needs to go in the hospital for  the initiation of dofetilide.

## 2018-02-18 ENCOUNTER — Telehealth: Payer: Self-pay | Admitting: Pharmacist

## 2018-02-18 DIAGNOSIS — I10 Essential (primary) hypertension: Secondary | ICD-10-CM | POA: Diagnosis not present

## 2018-02-18 NOTE — Telephone Encounter (Signed)
Medication list reviewed in anticipation of upcoming Tikosyn initiation. Patient is not taking any contraindicated or QTc prolonging medications.   Patient is anticoagulated on Xarelto 20mg daily on the appropriate dose. Please ensure that patient has not missed any anticoagulation doses in the 3 weeks prior to Tikosyn initiation.   Patient will need to be counseled to avoid use of Benadryl while on Tikosyn and in the 2-3 days prior to Tikosyn initiation.  

## 2018-02-21 ENCOUNTER — Telehealth: Payer: Self-pay | Admitting: Cardiovascular Disease

## 2018-02-21 NOTE — Telephone Encounter (Signed)
Patient requesting appointment with provider due to the way he feels after exerting himself. We have nothing available until the end of October with [facility]. Please call patient to discuss symptoms./ tg

## 2018-02-21 NOTE — Telephone Encounter (Signed)
Pt has apt next apt next week in afib clinic and is looking forward to it.i advise him to lay low, nothing that causes him exertion, he agrees

## 2018-02-25 DIAGNOSIS — J309 Allergic rhinitis, unspecified: Secondary | ICD-10-CM | POA: Diagnosis not present

## 2018-02-25 DIAGNOSIS — Z299 Encounter for prophylactic measures, unspecified: Secondary | ICD-10-CM | POA: Diagnosis not present

## 2018-02-25 DIAGNOSIS — I4891 Unspecified atrial fibrillation: Secondary | ICD-10-CM | POA: Diagnosis not present

## 2018-02-25 DIAGNOSIS — Z6836 Body mass index (BMI) 36.0-36.9, adult: Secondary | ICD-10-CM | POA: Diagnosis not present

## 2018-02-25 DIAGNOSIS — I10 Essential (primary) hypertension: Secondary | ICD-10-CM | POA: Diagnosis not present

## 2018-02-28 ENCOUNTER — Encounter (HOSPITAL_COMMUNITY): Payer: Self-pay | Admitting: General Practice

## 2018-02-28 ENCOUNTER — Ambulatory Visit (HOSPITAL_COMMUNITY)
Admission: RE | Admit: 2018-02-28 | Discharge: 2018-02-28 | Disposition: A | Discharge status: Home / Self Care | Payer: Medicare Other | Source: Ambulatory Visit | Attending: Nurse Practitioner | Admitting: Nurse Practitioner

## 2018-02-28 ENCOUNTER — Other Ambulatory Visit: Payer: Self-pay

## 2018-02-28 ENCOUNTER — Encounter (HOSPITAL_COMMUNITY): Payer: Self-pay | Admitting: Nurse Practitioner

## 2018-02-28 ENCOUNTER — Inpatient Hospital Stay (HOSPITAL_COMMUNITY)
Admission: RE | Admit: 2018-02-28 | Discharge: 2018-03-03 | DRG: 310 | Disposition: A | Discharge status: Home / Self Care | Payer: Medicare Other | Attending: Internal Medicine | Admitting: Internal Medicine

## 2018-02-28 VITALS — BP 148/82 | HR 61 | Ht 74.0 in | Wt 282.0 lb

## 2018-02-28 DIAGNOSIS — M4800 Spinal stenosis, site unspecified: Secondary | ICD-10-CM | POA: Diagnosis present

## 2018-02-28 DIAGNOSIS — M1990 Unspecified osteoarthritis, unspecified site: Secondary | ICD-10-CM | POA: Diagnosis present

## 2018-02-28 DIAGNOSIS — Z79899 Other long term (current) drug therapy: Secondary | ICD-10-CM | POA: Diagnosis not present

## 2018-02-28 DIAGNOSIS — N400 Enlarged prostate without lower urinary tract symptoms: Secondary | ICD-10-CM | POA: Diagnosis present

## 2018-02-28 DIAGNOSIS — G629 Polyneuropathy, unspecified: Secondary | ICD-10-CM | POA: Diagnosis present

## 2018-02-28 DIAGNOSIS — Z801 Family history of malignant neoplasm of trachea, bronchus and lung: Secondary | ICD-10-CM

## 2018-02-28 DIAGNOSIS — Z9849 Cataract extraction status, unspecified eye: Secondary | ICD-10-CM | POA: Diagnosis not present

## 2018-02-28 DIAGNOSIS — G8929 Other chronic pain: Secondary | ICD-10-CM | POA: Diagnosis present

## 2018-02-28 DIAGNOSIS — I480 Paroxysmal atrial fibrillation: Secondary | ICD-10-CM | POA: Diagnosis present

## 2018-02-28 DIAGNOSIS — Z800 Family history of malignant neoplasm of digestive organs: Secondary | ICD-10-CM

## 2018-02-28 DIAGNOSIS — I10 Essential (primary) hypertension: Secondary | ICD-10-CM | POA: Diagnosis present

## 2018-02-28 DIAGNOSIS — M549 Dorsalgia, unspecified: Secondary | ICD-10-CM | POA: Diagnosis present

## 2018-02-28 DIAGNOSIS — M109 Gout, unspecified: Secondary | ICD-10-CM | POA: Diagnosis present

## 2018-02-28 DIAGNOSIS — I4819 Other persistent atrial fibrillation: Principal | ICD-10-CM | POA: Diagnosis present

## 2018-02-28 DIAGNOSIS — Z8249 Family history of ischemic heart disease and other diseases of the circulatory system: Secondary | ICD-10-CM

## 2018-02-28 DIAGNOSIS — E041 Nontoxic single thyroid nodule: Secondary | ICD-10-CM | POA: Diagnosis present

## 2018-02-28 DIAGNOSIS — E669 Obesity, unspecified: Secondary | ICD-10-CM | POA: Diagnosis present

## 2018-02-28 DIAGNOSIS — I4891 Unspecified atrial fibrillation: Secondary | ICD-10-CM | POA: Diagnosis not present

## 2018-02-28 DIAGNOSIS — Z5181 Encounter for therapeutic drug level monitoring: Secondary | ICD-10-CM

## 2018-02-28 HISTORY — DX: Pneumonia, unspecified organism: J18.9

## 2018-02-28 LAB — BASIC METABOLIC PANEL
ANION GAP: 6 (ref 5–15)
BUN: 13 mg/dL (ref 8–23)
CALCIUM: 8.5 mg/dL — AB (ref 8.9–10.3)
CHLORIDE: 108 mmol/L (ref 98–111)
CO2: 25 mmol/L (ref 22–32)
Creatinine, Ser: 0.89 mg/dL (ref 0.61–1.24)
GFR calc Af Amer: >60 mL/min (ref >60)
GFR calc non Af Amer: >60 mL/min (ref >60)
GLUCOSE: 111 mg/dL — AB (ref 70–99)
Potassium: 3.8 mmol/L (ref 3.5–5.1)
Sodium: 139 mmol/L (ref 135–145)

## 2018-02-28 LAB — MAGNESIUM: Magnesium: 2.1 mg/dL (ref 1.7–2.4)

## 2018-02-28 LAB — POTASSIUM: Potassium: 4 mmol/L (ref 3.5–5.1)

## 2018-02-28 MED ORDER — SODIUM CHLORIDE 0.9% FLUSH: 3.0000 mL | INTRAVENOUS | Status: DC | PRN

## 2018-02-28 MED ORDER — LORATADINE 10 MG PO TABS
10.0000 mg | ORAL_TABLET | Freq: Every day | ORAL | Status: DC | PRN
  Administered 2018-03-02: 10 mg via ORAL
  Filled 2018-02-28: qty 1

## 2018-02-28 MED ORDER — TAMSULOSIN HCL 0.4 MG PO CAPS
0.4000 mg | ORAL_CAPSULE | Freq: Every day | ORAL | Status: DC
  Administered 2018-02-28 – 2018-03-02 (×3): 0.4 mg via ORAL
  Filled 2018-02-28 (×3): qty 1

## 2018-02-28 MED ORDER — TURMERIC 400 MG PO CAPS: 400.0000 mg | ORAL_CAPSULE | Freq: Every day | ORAL | Status: DC

## 2018-02-28 MED ORDER — COENZYME Q10 200 MG PO CAPS: 200.0000 mg | ORAL_CAPSULE | Freq: Every day | ORAL | Status: DC

## 2018-02-28 MED ORDER — FERROUS FUMARATE 324 (106 FE) MG PO TABS
1.0000 | ORAL_TABLET | Freq: Every day | ORAL | Status: DC
  Administered 2018-02-28 – 2018-03-02 (×3): 106 mg via ORAL
  Filled 2018-02-28 (×3): qty 1

## 2018-02-28 MED ORDER — ACETAMINOPHEN 500 MG PO TABS
500.0000 mg | ORAL_TABLET | Freq: Four times a day (QID) | ORAL | Status: DC | PRN
  Administered 2018-03-01: 1000 mg via ORAL
  Filled 2018-02-28: qty 2

## 2018-02-28 MED ORDER — DOFETILIDE 500 MCG PO CAPS
500.0000 ug | ORAL_CAPSULE | Freq: Two times a day (BID) | ORAL | Status: DC
  Administered 2018-02-28 – 2018-03-01 (×3): 500 ug via ORAL
  Filled 2018-02-28 (×3): qty 1

## 2018-02-28 MED ORDER — TRIAMCINOLONE ACETONIDE 55 MCG/ACT NA AERO
2.0000 | INHALATION_SPRAY | Freq: Every day | NASAL | Status: DC
  Filled 2018-02-28: qty 21.6

## 2018-02-28 MED ORDER — RESVERATROL 100 MG PO CAPS: 100.0000 mg | ORAL_CAPSULE | Freq: Every day | ORAL | Status: DC

## 2018-02-28 MED ORDER — SODIUM CHLORIDE 0.9% FLUSH
3.0000 mL | Freq: Two times a day (BID) | INTRAVENOUS | Status: DC
  Administered 2018-02-28 – 2018-03-02 (×5): 3 mL via INTRAVENOUS

## 2018-02-28 MED ORDER — OMEGA-3-ACID ETHYL ESTERS 1 G PO CAPS
3000.0000 mg | ORAL_CAPSULE | Freq: Every day | ORAL | Status: DC
  Administered 2018-02-28 – 2018-03-03 (×4): 3000 mg via ORAL
  Filled 2018-02-28 (×6): qty 3

## 2018-02-28 MED ORDER — CARVEDILOL 25 MG PO TABS
25.0000 mg | ORAL_TABLET | Freq: Two times a day (BID) | ORAL | Status: DC
  Administered 2018-02-28 – 2018-03-01 (×2): 25 mg via ORAL
  Filled 2018-02-28 (×3): qty 1

## 2018-02-28 MED ORDER — POTASSIUM CHLORIDE CRYS ER 20 MEQ PO TBCR
30.0000 meq | EXTENDED_RELEASE_TABLET | Freq: Once | ORAL | Status: AC
  Administered 2018-02-28: 30 meq via ORAL
  Filled 2018-02-28: qty 1

## 2018-02-28 MED ORDER — IRBESARTAN 150 MG PO TABS
150.0000 mg | ORAL_TABLET | Freq: Every day | ORAL | Status: DC
  Administered 2018-03-01 – 2018-03-03 (×3): 150 mg via ORAL
  Filled 2018-02-28 (×3): qty 1

## 2018-02-28 MED ORDER — SODIUM CHLORIDE 0.9 % IV SOLN: 250.0000 mL | INTRAVENOUS | Status: DC | PRN

## 2018-02-28 MED ORDER — VITAMIN D 1000 UNITS PO TABS
2000.0000 [IU] | ORAL_TABLET | Freq: Every day | ORAL | Status: DC
  Administered 2018-02-28 – 2018-03-02 (×3): 2000 [IU] via ORAL
  Filled 2018-02-28 (×3): qty 2

## 2018-02-28 MED ORDER — RIVAROXABAN 20 MG PO TABS
20.0000 mg | ORAL_TABLET | Freq: Every day | ORAL | Status: DC
  Administered 2018-03-01 – 2018-03-02 (×2): 20 mg via ORAL
  Filled 2018-02-28 (×2): qty 1

## 2018-02-28 MED ORDER — ALLOPURINOL 300 MG PO TABS
300.0000 mg | ORAL_TABLET | Freq: Every day | ORAL | Status: DC
  Administered 2018-02-28 – 2018-03-02 (×3): 300 mg via ORAL
  Filled 2018-02-28 (×3): qty 1

## 2018-02-28 NOTE — Progress Notes (Signed)
Primary Care Physician: Glenda Chroman, MD Referring Physician:Dr. Rishan Oyama is a 67 y.o. male with a h/o paroxysmal afib that is in the afib clinic for admission for Tikosyn. He is aware of the cost for drug,  quotes about $90 a month and states he can afford this. No missed anticoagulation, no benadryl use.  Today, he denies symptoms of palpitations, chest pain, shortness of breath, orthopnea, PND, lower extremity edema, dizziness, presyncope, syncope, or neurologic sequela. The patient is tolerating medications without difficulties and is otherwise without complaint today.   Past Medical History:  Diagnosis Date  . Arthritis   . Atrial fib/flutter, transient    a. s/p TEE-guided DCCV on 12/14/2017 with return to NSR.   [name] BPH (benign prostatic hyperplasia) 12/11/2017  . Chronic back pain    Spinal stenosis  . Essential hypertension   . Gout   . Neuropathy   . Thyroid nodule    Benign   Past Surgical History:  Procedure Laterality Date  . CARDIOVERSION N/A 12/14/2017   Procedure: CARDIOVERSION;  Surgeon: Satira Sark, MD;  Location: [facility];  Service: Cardiovascular;  Laterality: N/A;  . CATARACT EXTRACTION    . COLONOSCOPY  2012  . IRRIGATION AND DEBRIDEMENT SEBACEOUS CYST    . LUMBAR LAMINECTOMY N/A 01/20/2013   Procedure: MICRODISCECTOMY LUMBAR LAMINECTOMY;  Surgeon: Marybelle Killings, MD;  Location: [place];  Service: Orthopedics;  Laterality: N/A;  L4-5 Decompression  . PILONIDAL CYST DRAINAGE    . TEE WITHOUT CARDIOVERSION N/A 12/14/2017   Procedure: TRANSESOPHAGEAL ECHOCARDIOGRAM (TEE) WITH PROPOFOL;  Surgeon: Satira Sark, MD;  Location: [facility];  Service: Cardiovascular;  Laterality: N/A;    No current facility-administered medications for this encounter.    No current outpatient medications on file.   Facility-Administered Medications Ordered in Other Encounters  Medication Dose Route Frequency Provider Last Rate Last Dose  . 0.9 %  sodium  chloride infusion  250 mL Intravenous PRN Baldwin Jamaica, PA-C      . dofetilide [facility]) capsule 500 mcg  500 mcg Oral BID Baldwin Jamaica, PA-C      . potassium chloride (K-DUR,KLOR-CON) CR tablet 30 mEq  30 mEq Oral Once Baldwin Jamaica, PA-C      . rivaroxaban [facility]) tablet 20 mg  20 mg Oral Q supper Baldwin Jamaica, PA-C      . sodium chloride flush (NS) 0.9 % injection 3 mL  3 mL Intravenous Q12H Baldwin Jamaica, PA-C      . sodium chloride flush (NS) 0.9 % injection 3 mL  3 mL Intravenous PRN Baldwin Jamaica, PA-C        No Known Allergies  Social History   Socioeconomic History  . Marital status: Married    Spouse name: Not on file  . Number of children: 3  . Years of education: Not on file  . Highest education level: Not on file  Occupational History  . Not on file  Social Needs  . Financial resource strain: Not on file  . Food insecurity:    Worry: Not on file    Inability: Not on file  . Transportation needs:    Medical: Not on file    Non-medical: Not on file  Tobacco Use  . Smoking status: Never Smoker  . Smokeless tobacco: Never Used  Substance and Sexual Activity  . Alcohol use: No    Alcohol/week: 0.0 standard drinks  . Drug use: No  .  Sexual activity: Not on file  Lifestyle  . Physical activity:    Days per week: Not on file    Minutes per session: Not on file  . Stress: Not on file  Relationships  . Social connections:    Talks on phone: Not on file    Gets together: Not on file    Attends religious service: Not on file    Active member of club or organization: Not on file    Attends meetings of clubs or organizations: Not on file    Relationship status: Not on file  . Intimate partner violence:    Fear of current or ex partner: Not on file    Emotionally abused: Not on file    Physically abused: Not on file    Forced sexual activity: Not on file  Other Topics Concern  . Not on file  Social History Narrative  . Not on file     Family History  Problem Relation Age of Onset  . Lung cancer Father   . Cancer - Lung Father   . Atrial fibrillation Mother   . Dementia Mother   . Transient ischemic attack Mother   . Hypertension Unknown   . Liver cancer Brother     ROS- All systems are reviewed and negative except as per the HPI above  Physical Exam: Vitals:   02/28/18 0947  BP: (!) 148/82  Pulse: 61  Weight: 127.9 kg  Height: 6\' 2"  (1.88 m)   Wt Readings from Last 3 Encounters:  02/28/18 128 kg  02/28/18 127.9 kg  02/16/18 125.6 kg    Labs: Lab Results  Component Value Date   NA 139 02/28/2018   K 3.8 02/28/2018   CL 108 02/28/2018   CO2 25 02/28/2018   GLUCOSE 111 (H) 02/28/2018   BUN 13 02/28/2018   CREATININE 0.89 02/28/2018   CALCIUM 8.5 (L) 02/28/2018   MG 2.1 02/28/2018   Lab Results  Component Value Date   INR 1.08 12/11/2017   No results found for: CHOL, HDL, LDLCALC, TRIG   GEN- The patient is well appearing, alert and oriented x 3 today.   Head- normocephalic, atraumatic Eyes-  Sclera clear, conjunctiva pink Ears- hearing intact Oropharynx- clear Neck- supple, no JVP Lymph- no cervical lymphadenopathy Lungs- Clear to ausculation bilaterally, normal work of breathing Heart- Regular rate and rhythm, no murmurs, rubs or gallops, PMI not laterally displaced GI- soft, NT, ND, + BS Extremities- no clubbing, cyanosis, or edema MS- no significant deformity or atrophy Skin- no rash or lesion Psych- euthymic mood, full affect Neuro- strength and sensation are intact  EKG- NSR at 61 bpm, Pr int 156 ms, qrs int 100 ms, qtc 412 ms Epic records reviewed    Assessment and Plan: 1. Paroxysmal afib  To be admitted to Dr. Lovena Le for tikosyn loading Aware of cost No benadryl use No missed doses of xarelto, did caution him on the combined use of tumeric with xarelto, can increase bleeding risk qtc stable at  412 ms Bmet/mag pending  Butch Penny C. Keanu Frickey, [facility] [address] [place], [place] [address] [phone]

## 2018-02-28 NOTE — Progress Notes (Signed)
Pharmacy Review for Dofetilide (Tikosyn) Initiation  Admit Complaint: 67 y.o. male admitted 02/28/2018 with atrial fibrillation to be initiated on dofetilide.   Assessment:  Patient Exclusion Criteria: If any screening criteria checked as "Yes", then  patient  should NOT receive dofetilide until criteria item is corrected. If "Yes" please indicate correction plan.  YES  NO Patient  Exclusion Criteria Correction Plan  []  []  Baseline QTc interval is greater than or equal to 440 msec. IF above YES box checked dofetilide contraindicated unless patient has ICD; then may proceed if QTc 500-550 msec or with known ventricular conduction abnormalities may proceed with QTc 550-600 msec. QTc = 0.41   []  [x]  Magnesium level is less than 1.8 mEq/l : Last magnesium:  Lab Results  Component Value Date   MG 2.1 02/28/2018         [x]  []  Potassium level is less than 4 mEq/l : Last potassium:  Lab Results  Component Value Date   K 3.8 02/28/2018       Supplementing: will f/u  []  [x]  Patient is known or suspected to have a digoxin level greater than 2 ng/ml: No results found for: DIGOXIN    []  [x]  Creatinine clearance less than 20 ml/min (calculated using Cockcroft-Gault, actual body weight and serum creatinine): Estimated Creatinine Clearance: 112.9 mL/min (by C-G formula based on SCr of 0.89 mg/dL).    []  [x]  Patient has received drugs known to prolong the QT intervals within the last 48 hours (phenothiazines, tricyclics or tetracyclic antidepressants, erythromycin, H-1 antihistamines, cisapride, fluoroquinolones, azithromycin). Drugs not listed above may have an, as yet, undetected potential to prolong the QT interval, updated information on QT prolonging agents is available at this website:QT prolonging agents   []  [x]  Patient received a dose of hydrochlorothiazide (Oretic) alone or in any combination including triamterene (Dyazide, Maxzide) in the last 48 hours.   []  [x]  Patient received a  medication known to increase dofetilide plasma concentrations prior to initial dofetilide dose:  . Trimethoprim (Primsol, Proloprim) in the last 36 hours . Verapamil (Calan, Verelan) in the last 36 hours or a sustained release dose in the last 72 hours . Megestrol (Megace) in the last 5 days  . Cimetidine (Tagamet) in the last 6 hours . Ketoconazole (Nizoral) in the last 24 hours . Itraconazole (Sporanox) in the last 48 hours  . Prochlorperazine (Compazine) in the last 36 hours    []  [x]  Patient is known to have a history of torsades de pointes; congenital or acquired long QT syndromes.   []  [x]  Patient has received a Class 1 antiarrhythmic with less than 2 half-lives since last dose. (Disopyramide, Quinidine, Procainamide, Lidocaine, Mexiletine, Flecainide, Propafenone)   []  [x]  Patient has received amiodarone therapy in the past 3 months or amiodarone level is greater than 0.3 ng/ml.    Patient has been appropriately anticoagulated with Xarelto.  Ordering provider was confirmed at [email or web address] if they are not listed on the [place] Prescribers list.  Goal of Therapy: Follow renal function, electrolytes, potential drug interactions, and dose adjustment. Provide education and 1 week supply at discharge.  Plan:  [x]   Physician selected initial dose within range recommended for patients level of renal function - will monitor for response.  []   Physician selected initial dose outside of range recommended for patients level of renal function - will discuss if the dose should be altered at this time.   Select One Calculated CrCl  Dose q12h  [x]  > 60 ml/min 500 mcg  []   40-60 ml/min 250 mcg  []  20-40 ml/min 125 mcg   2. Follow up QTc after the first 5 doses, renal function, electrolytes (K & Mg) daily x 3 days, dose adjustment, success of initiation and facilitate 1 week discharge supply as clinically indicated.  3. Initiate Tikosyn education video (Call [phone] and ask for  video # [phone]).  4. Place Enrollment Form on the chart for discharge supply of dofetilide.  Elicia Lamp, PharmD, BCPS Clinical Pharmacist Clinical phone [phone] Please check AMION for all [place] contact numbers 02/28/2018 12:43 PM

## 2018-02-28 NOTE — Care Management (Signed)
#    9.  S/W [facility]  @  [facility] [employer]  # [phone]   1. TIKOSYN  1250 MCG 250 MCG   500 MCG BID COVER- NONE FORMULARY FOR EACH PRESCRIPTION PRIOR APPROVAL- YES # [phone] FOR EXCEPTION   2. DOFETILIDE 125 MCG  BID COVER- YES CO-PAY- $ 90.43 TIER- NO PRIOR APPROVAL- NO  3. DOFETILIDE  250 MCG BID COVER- YES CO-PAY- $ 81.40 TIER- NO PRIOR APPROVAL- NO  4. DOFETILIDE 500 MCG BID COVER- YES CO-PAY- $ 79.54 TIER- NO PRIOR APPROVAL- NO  NO DEDUCTIBLE  PREFERRED PHARMACY : YES   [facility]

## 2018-02-28 NOTE — H&P (Addendum)
Cardiology Admission History and Physical:   Patient ID: [name] MRN: [id number]; DOB: May 20, 1951   Admission date: 02/28/2018  Primary Care Provider: Glenda Chroman, MD Primary Cardiologist: Kate Sable, MD  Primary Electrophysiologist:  Cristopher Peru, MD   Chief Complaint:  Tikosyn initiation  Patient Profile:   [name] is a 67 y.o. male with PMHx of gout, HTN, obesity, LV dysfunction, and AFib  History of Present Illness:   [name] developed atrial fib several months ago. He underwent TEE guided DCCV and maintained NSR until a few days prior to seeing Dr. Lovena James last month.  EF 45% with high V rates when in AF.  Discussion regarding rate vs rhythm control (including AAD and ablation strategies) was held, decided to pursue Tikosyn initiation.  [facility] has reviewed home meds with no contraindicated drugs noted.   The patient is aware of his AFib when he has it, he can feel the palpitations.  He confirms no missed doses of his Xarelto in the last 3 weeks.  Discussed potential risks/benefots of Tikosyn, the initiation protocol with anticipated discharge Thursday.  He would like to proceed.   Past Medical History:  Diagnosis Date  . Arthritis   . Atrial fib/flutter, transient    a. s/p TEE-guided DCCV on 12/14/2017 with return to NSR.   [name] BPH (benign prostatic hyperplasia) 12/11/2017  . Chronic back pain    Spinal stenosis  . Essential hypertension   . Gout   . Neuropathy   . Thyroid nodule    Benign    Past Surgical History:  Procedure Laterality Date  . CARDIOVERSION N/A 12/14/2017   Procedure: CARDIOVERSION;  Surgeon: Satira Sark, MD;  Location: [facility];  Service: Cardiovascular;  Laterality: N/A;  . CATARACT EXTRACTION    . COLONOSCOPY  2012  . IRRIGATION AND DEBRIDEMENT SEBACEOUS CYST    . LUMBAR LAMINECTOMY N/A 01/20/2013   Procedure: MICRODISCECTOMY LUMBAR LAMINECTOMY;  Surgeon: Marybelle Killings, MD;  Location: [facility];   Service: Orthopedics;  Laterality: N/A;  L4-5 Decompression  . PILONIDAL CYST DRAINAGE    . TEE WITHOUT CARDIOVERSION N/A 12/14/2017   Procedure: TRANSESOPHAGEAL ECHOCARDIOGRAM (TEE) WITH PROPOFOL;  Surgeon: Satira Sark, MD;  Location: [facility];  Service: Cardiovascular;  Laterality: N/A;     Medications Prior to Admission: Prior to Admission medications   Medication Sig Start Date End Date Taking? Authorizing Provider  acetaminophen (TYLENOL) 500 MG tablet Take 500-1,000 mg by mouth every 6 (six) hours as needed (for pain.).    [provider]  allopurinol (ZYLOPRIM) 300 MG tablet Take 300 mg by mouth daily at 3 pm.     [provider]  Calcium Carb-Cholecalciferol (CALCIUM + D3 PO) Take 1 tablet by mouth daily at 3 pm.     [provider]  carvedilol (COREG) 25 MG tablet Take 1 tablet (25 mg total) by mouth 2 (two) times daily. 02/14/18 05/15/18  Herminio Commons, MD  cetirizine (ZYRTEC) 10 MG tablet Take 10 mg by mouth daily as needed for allergies.    [provider]  Cholecalciferol (VITAMIN D3) 2000 UNITS TABS Take 2,000 Units by mouth daily at 3 pm.     [provider]  CINNAMON PO Take 350 mg by mouth daily at 3 pm.     [provider]  Coenzyme Q10 200 MG capsule Take 200 mg by mouth daily at 3 pm.     [provider]  Ferrous Fumarate (  IRON) 18 MG TBCR Take 18 mg by mouth daily at 3 pm.     [provider]  fish oil-omega-3 fatty acids 1000 MG capsule Take 3,000 mg by mouth daily.     [provider]  Glucosamine-Chondroitin (GLUCOSAMINE CHONDR COMPLEX PO) Take 1 tablet by mouth 3 (three) times a week. 1500mg /1200mg     [provider]  LUTEIN-ZEAXANTHIN PO Take 1 tablet by mouth daily at 3 pm.     [provider]  Multiple Vitamins-Minerals (MULTIVITAMIN PO) Take 1 tablet by mouth daily at 3 pm.     [provider]  olmesartan (BENICAR) 20 MG tablet Take 1 tablet (20 mg  total) by mouth daily. Patient taking differently: Take 20 mg by mouth daily with lunch.  01/18/18   Ahmed Prima, Fransisco Hertz, PA-C  PHOSPHATIDYLCHOLINE PO Take 100 mg by mouth daily at 3 pm.     [provider]  Probiotic Product (PROBIOTIC DAILY PO) Take 2 tablets by mouth daily at 3 pm. 5 billion unit daily    [provider]  RESVERATROL 100 MG CAPS Take 100 mg by mouth daily at 3 pm.     [provider]  rivaroxaban (XARELTO) 20 MG TABS tablet Take 1 tablet (20 mg total) by mouth daily with lunch. 02/09/18   Ahmed Prima, Fransisco Hertz, PA-C  tamsulosin (FLOMAX) 0.4 MG CAPS capsule Take 0.4 mg by mouth daily at 3 pm.  08/06/15   [provider]  triamcinolone (NASACORT ALLERGY 24HR) 55 MCG/ACT AERO nasal inhaler Place 2 sprays into the nose daily.    [provider]  Turmeric 400 MG CAPS Take 400 mg by mouth daily at 3 pm.    [provider]     Allergies:   No Known Allergies  Social History:   Social History   Socioeconomic History  . Marital status: Married    Spouse name: Not on file  . Number of children: 3  . Years of education: Not on file  . Highest education level: Not on file  Occupational History  . Not on file  Social Needs  . Financial resource strain: Not on file  . Food insecurity:    Worry: Not on file    Inability: Not on file  . Transportation needs:    Medical: Not on file    Non-medical: Not on file  Tobacco Use  . Smoking status: Never Smoker  . Smokeless tobacco: Never Used  Substance and Sexual Activity  . Alcohol use: No    Alcohol/week: 0.0 standard drinks  . Drug use: No  . Sexual activity: Not on file  Lifestyle  . Physical activity:    Days per week: Not on file    Minutes per session: Not on file  . Stress: Not on file  Relationships  . Social connections:    Talks on phone: Not on file    Gets together: Not on file    Attends religious service: Not on file    Active member of club or  organization: Not on file    Attends meetings of clubs or organizations: Not on file    Relationship status: Not on file  . Intimate partner violence:    Fear of current or ex partner: Not on file    Emotionally abused: Not on file    Physically abused: Not on file    Forced sexual activity: Not on file  Other Topics Concern  . Not on file  Social History Narrative  .  Not on file    Family History:  The patient's family history includes Atrial fibrillation in his mother; Cancer - Lung in his father; Dementia in his mother; Hypertension in his unknown relative; Liver cancer in his brother; Lung cancer in his father; Transient ischemic attack in his mother.    ROS:  Please see the history of present illness.  All other ROS reviewed and negative.     Physical Exam/Data:   Vitals:   02/28/18 1135  BP: (!) 147/91  Pulse: 63  Resp: 20  Temp: 97.7 F (36.5 C)  TempSrc: Oral  SpO2: 97%   No intake or output data in the 24 hours ending 02/28/18 1136 There were no vitals filed for this visit. There is no height or weight on file to calculate BMI.  General:  Well nourished, well developed, in no acute distress HEENT: normal Lymph: no adenopathy Neck: no JVD Endocrine:  No thryomegaly Vascular: No carotid bruits  Cardiac:  RRR; no murmurs, gallops or rubs Lungs:  CTA b/l, no wheezing, rhonchi or rales  Abd: soft, nontende Ext: trace-1+ edema Musculoskeletal:  No deformities Skin: warm and dry  Neuro:  no focal abnormalities noted Psych:  Normal affect    EKG:  The ECG that was done today was personally reviewed and demonstrates  SR, 61bpm, QTc 474ms  Relevant CV Studies:  12/14/17: TEE Study Conclusions - Left ventricle: The estimated ejection fraction was 55%. Wall   motion was normal; there were no regional wall motion   abnormalities. No evidence of thrombus. - Aortic valve: There was trivial regurgitation. - Descending aorta: The descending aorta had mild diffuse  disease. - Mitral valve: There was mild regurgitation. - Left atrium: The atrium was moderately dilated. No evidence of   thrombus in the atrial cavity or appendage. Emptying velocity was   normal. - Right atrium: The atrium was dilated. No evidence of thrombus in   the atrial cavity or appendage. - Atrial septum: The interatrial septum was hypermobile. No defect   or patent foramen ovale was identified based on color Doppler   evaluation. - Tricuspid valve: There was mild regurgitation. - Impressions: Successful cardioversion. No cardiac source of   emboli was identified. Impressions: - Successful cardioversion. No cardiac source of emboli was   identified.    12/12/17: TTE Study Conclusions - Left ventricle: EF difficult to estimate due to presence of   atrial fibrillation and variable heart rate. Appears mildly   depressed. The cavity size was normal. Wall thickness was   increased in a pattern of mild LVH. Systolic function was mildly   reduced. The estimated ejection fraction was in the range of 45%   to 50%. Wall motion was normal; there were no regional wall   motion abnormalities. - Left atrium: The atrium was moderately dilated. - Pulmonary arteries: Systolic pressure was mildly increased. PA   peak pressure: 32 mm Hg (S).  Laboratory Data:  Chemistry Recent Labs  Lab 02/28/18 1026  NA 139  K 3.8  CL 108  CO2 25  GLUCOSE 111*  BUN 13  CREATININE 0.89  CALCIUM 8.5*  GFRNONAA >60  GFRAA >60  ANIONGAP 6    No results for input(s): PROT, ALBUMIN, AST, ALT, ALKPHOS, BILITOT in the last 168 hours. HematologyNo results for input(s): WBC, RBC, HGB, HCT, MCV, MCH, MCHC, RDW, PLT in the last 168 hours. Cardiac EnzymesNo results for input(s): TROPONINI in the last 168 hours. No results for input(s): TROPIPOC in the last  168 hours.  BNPNo results for input(s): BNP, PROBNP in the last 168 hours.  DDimer No results for input(s): DDIMER in the last 168  hours.  Radiology/Studies:  No results found.  Assessment and Plan:   1. Persistent AFib, here for Tikosyn initiation     CHA2DS2Vasc is 2, on Eiquis, appropriately dosed     K+ 3.8, replacement ordered, recheck     Mag 2.1     Creat 0.89 (calc CrCl is 144     QTc is OK  Start 536mcg  2. HTN     Continue home meds    For questions or updates, please contact [place] Please consult [email or web address] for contact info under        Signed, Baldwin Jamaica, PA-C  02/28/2018 11:36 AM   EP attending  Patient seen and examined.  He is well-known to me from prior clinic visit.  He presents today for initiation of dofetilide.  His potassium has been repleted.  His EKG looks good.  He will be admitted for initiation of dofetilide.  As he is in sinus rhythm, he will not require DC cardioversion.  Follow-up QT interval and electrolytes.  Cristopher Peru, MD

## 2018-03-01 LAB — BASIC METABOLIC PANEL
ANION GAP: 5 (ref 5–15)
Anion gap: 7 (ref 5–15)
BUN: 12 mg/dL (ref 8–23)
BUN: 15 mg/dL (ref 8–23)
CHLORIDE: 110 mmol/L (ref 98–111)
CO2: 24 mmol/L (ref 22–32)
CO2: 25 mmol/L (ref 22–32)
CREATININE: 0.9 mg/dL (ref 0.61–1.24)
CREATININE: 0.96 mg/dL (ref 0.61–1.24)
Calcium: 8.1 mg/dL — ABNORMAL LOW (ref 8.9–10.3)
Calcium: 8.6 mg/dL — ABNORMAL LOW (ref 8.9–10.3)
Chloride: 107 mmol/L (ref 98–111)
GFR calc non Af Amer: >60 mL/min (ref >60)
GFR calc non Af Amer: >60 mL/min (ref >60)
Glucose, Bld: 94 mg/dL (ref 70–99)
Glucose, Bld: 105 mg/dL — ABNORMAL HIGH (ref 70–99)
POTASSIUM: 3.8 mmol/L (ref 3.5–5.1)
Potassium: 4 mmol/L (ref 3.5–5.1)
SODIUM: 139 mmol/L (ref 135–145)
Sodium: 139 mmol/L (ref 135–145)

## 2018-03-01 LAB — MAGNESIUM: Magnesium: 2.1 mg/dL (ref 1.7–2.4)

## 2018-03-01 MED ORDER — POTASSIUM CHLORIDE CRYS ER 20 MEQ PO TBCR
40.0000 meq | EXTENDED_RELEASE_TABLET | Freq: Once | ORAL | Status: AC
  Administered 2018-03-01: 40 meq via ORAL
  Filled 2018-03-01: qty 2

## 2018-03-01 NOTE — Progress Notes (Addendum)
Progress Note  Patient Name: [name] Date of Encounter: 03/01/2018  Primary Cardiologist: Kate Sable, MD   Subjective   Not much sleep last night, no complaints, feels well.  Inpatient Medications    Scheduled Meds: . allopurinol  300 mg Oral Q1500  . carvedilol  25 mg Oral BID  . cholecalciferol  2,000 Units Oral Q1500  . dofetilide  500 mcg Oral BID  . Ferrous Fumarate  1 tablet Oral Q1500  . irbesartan  150 mg Oral Daily  . omega-3 acid ethyl esters  3,000 mg Oral Daily  . rivaroxaban  20 mg Oral Q supper  . sodium chloride flush  3 mL Intravenous Q12H  . tamsulosin  0.4 mg Oral Q1500  . triamcinolone  2 spray Nasal Daily   Continuous Infusions: . sodium chloride     PRN Meds: sodium chloride, acetaminophen, loratadine, sodium chloride flush   Vital Signs    Vitals:   02/28/18 1135 02/28/18 2055 03/01/18 0445  BP: (!) 147/91 126/71 123/62  Pulse: 63 60 (!) 58  Resp: 20    Temp: 97.7 F (36.5 C) 98.3 F (36.8 C) 98 F (36.7 C)  TempSrc: Oral Oral Oral  SpO2: 97% 95% 96%  Weight: 128 kg  126.8 kg  Height: 6\' 2"  (1.88 m)      Intake/Output Summary (Last 24 hours) at 03/01/2018 0815 Last data filed at 02/28/2018 1900 Gross per 24 hour  Intake 600 ml  Output -  Net 600 ml   Filed Weights   02/28/18 1135 03/01/18 0445  Weight: 128 kg 126.8 kg    Telemetry    SB/SR - Personally Reviewed  ECG    SR  - Personally Reviewed  Physical Exam   GEN: No acute distress.   Neck: No JVD Cardiac: RRR, no murmurs, rubs, or gallops.  Respiratory: CTA b/l. GI: Soft, nontender, non-distended  MS: No edema; No deformity. Neuro:  Nonfocal  Psych: Normal affect   Labs    Chemistry Recent Labs  Lab 02/28/18 1026 02/28/18 1644 03/01/18 0614  NA 139  --  139  K 3.8 4.0 3.8  CL 108  --  110  CO2 25  --  24  GLUCOSE 111*  --  94  BUN 13  --  12  CREATININE 0.89  --  0.90  CALCIUM 8.5*  --  8.1*  GFRNONAA >60  --  >60  GFRAA >60   --  >60  ANIONGAP 6  --  5     HematologyNo results for input(s): WBC, RBC, HGB, HCT, MCV, MCH, MCHC, RDW, PLT in the last 168 hours.  Cardiac EnzymesNo results for input(s): TROPONINI in the last 168 hours. No results for input(s): TROPIPOC in the last 168 hours.   BNPNo results for input(s): BNP, PROBNP in the last 168 hours.   DDimer No results for input(s): DDIMER in the last 168 hours.   Radiology    No results found.  Cardiac Studies   12/14/17: TEE Study Conclusions - Left ventricle: The estimated ejection fraction was 55%. Wall motion was normal; there were no regional wall motion abnormalities. No evidence of thrombus. - Aortic valve: There was trivial regurgitation. - Descending aorta: The descending aorta had mild diffuse disease. - Mitral valve: There was mild regurgitation. - Left atrium: The atrium was moderately dilated. No evidence of thrombus in the atrial cavity or appendage. Emptying velocity was normal. - Right atrium: The atrium was dilated. No evidence of thrombus  in the atrial cavity or appendage. - Atrial septum: The interatrial septum was hypermobile. No defect or patent foramen ovale was identified based on color Doppler evaluation. - Tricuspid valve: There was mild regurgitation. - Impressions: Successful cardioversion. No cardiac source of emboli was identified. Impressions: - Successful cardioversion. No cardiac source of emboli was identified.  Patient Profile     67 y.o. male with PMHx of gout, HTN, obesity, LV dysfunction, and AFib, admitted for Tikosyn initiation  Assessment & Plan    1. Persistent AFib, here for Tikosyn initiation     CHA2DS2Vasc is 2, on Eiquis, appropriately dosed     K+ 3.8, replacement ordered     Mag 2.1     Creat 0.90, stable     QTc is OK, maintaining SR   2. HTN     Continue home meds  For questions or updates, please contact [place] Please consult [email or web address] for contact  info under        Signed, Baldwin Jamaica, PA-C  03/01/2018, 8:15 AM    EP Attending  Patient seen and examined. Agree with above. He remains in NSR. His QT is still ok. We will follow.  Mikle Bosworth.D.

## 2018-03-02 ENCOUNTER — Ambulatory Visit (HOSPITAL_COMMUNITY): Admission: RE | Admit: 2018-03-02 | Payer: Medicare Other | Source: Ambulatory Visit | Admitting: Cardiology

## 2018-03-02 ENCOUNTER — Encounter (HOSPITAL_COMMUNITY)
Admission: RE | Disposition: A | Discharge status: Home / Self Care | Payer: Self-pay | Source: Home / Self Care | Attending: Internal Medicine

## 2018-03-02 LAB — BASIC METABOLIC PANEL
Anion gap: 9 (ref 5–15)
BUN: 18 mg/dL (ref 8–23)
CHLORIDE: 110 mmol/L (ref 98–111)
CO2: 21 mmol/L — ABNORMAL LOW (ref 22–32)
CREATININE: 0.98 mg/dL (ref 0.61–1.24)
Calcium: 8.4 mg/dL — ABNORMAL LOW (ref 8.9–10.3)
GFR calc Af Amer: >60 mL/min (ref >60)
GFR calc non Af Amer: >60 mL/min (ref >60)
GLUCOSE: 93 mg/dL (ref 70–99)
POTASSIUM: 4.3 mmol/L (ref 3.5–5.1)
SODIUM: 140 mmol/L (ref 135–145)

## 2018-03-02 LAB — MAGNESIUM: MAGNESIUM: 2.1 mg/dL (ref 1.7–2.4)

## 2018-03-02 SURGERY — CARDIOVERSION
Anesthesia: General

## 2018-03-02 MED ORDER — DOFETILIDE 250 MCG PO CAPS
250.0000 ug | ORAL_CAPSULE | Freq: Two times a day (BID) | ORAL | Status: DC
  Administered 2018-03-02 – 2018-03-03 (×3): 250 ug via ORAL
  Filled 2018-03-02 (×3): qty 1

## 2018-03-02 MED ORDER — CARVEDILOL 12.5 MG PO TABS
12.5000 mg | ORAL_TABLET | Freq: Two times a day (BID) | ORAL | Status: DC
  Administered 2018-03-02 – 2018-03-03 (×3): 12.5 mg via ORAL
  Filled 2018-03-02 (×3): qty 1

## 2018-03-02 NOTE — Discharge Instructions (Signed)

## 2018-03-02 NOTE — Progress Notes (Addendum)
Progress Note  Patient Name: [name] Date of Encounter: 03/02/2018  Primary Cardiologist: Kate Sable, MD   Subjective   Not much sleep last night, no complaints, feels well.  Inpatient Medications    Scheduled Meds: . allopurinol  300 mg Oral Q1500  . carvedilol  12.5 mg Oral BID  . cholecalciferol  2,000 Units Oral Q1500  . dofetilide  250 mcg Oral BID  . Ferrous Fumarate  1 tablet Oral Q1500  . irbesartan  150 mg Oral Daily  . omega-3 acid ethyl esters  3,000 mg Oral Daily  . rivaroxaban  20 mg Oral Q supper  . sodium chloride flush  3 mL Intravenous Q12H  . tamsulosin  0.4 mg Oral Q1500  . triamcinolone  2 spray Nasal Daily   Continuous Infusions: . sodium chloride     PRN Meds: sodium chloride, acetaminophen, loratadine, sodium chloride flush   Vital Signs    Vitals:   03/01/18 0445 03/01/18 1417 03/01/18 2053 03/02/18 0508  BP: 123/62 124/65 136/63 122/64  Pulse: (!) 58  (!) 55 (!) 57  Resp:   20   Temp: 98 F (36.7 C) 98 F (36.7 C) 98.3 F (36.8 C) 98 F (36.7 C)  TempSrc: Oral Oral Oral Oral  SpO2: 96% 98% 96% 97%  Weight: 126.8 kg     Height:        Intake/Output Summary (Last 24 hours) at 03/02/2018 0919 Last data filed at 03/01/2018 1300 Gross per 24 hour  Intake 480 ml  Output -  Net 480 ml   Filed Weights   02/28/18 1135 03/01/18 0445  Weight: 128 kg 126.8 kg    Telemetry    SB/SR 40's evening, 50's daytime- Personally Reviewed  ECG    SR  - Personally Reviewed  Physical Exam   GEN: No acute distress.   Neck: No JVD Cardiac: RRR, no murmurs, rubs, or gallops.  Respiratory: CTA b/l. GI: Soft, nontender, non-distended  MS: No edema; No deformity. Neuro:  Nonfocal  Psych: Normal affect   Labs    Chemistry Recent Labs  Lab 03/01/18 0614 03/01/18 1916 03/02/18 0352  NA 139 139 140  K 3.8 4.0 4.3  CL 110 107 110  CO2 24 25 21*  GLUCOSE 94 105* 93  BUN 12 15 18   CREATININE 0.90 0.96 0.98  CALCIUM  8.1* 8.6* 8.4*  GFRNONAA >60 >60 >60  GFRAA >60 >60 >60  ANIONGAP 5 7 9      HematologyNo results for input(s): WBC, RBC, HGB, HCT, MCV, MCH, MCHC, RDW, PLT in the last 168 hours.  Cardiac EnzymesNo results for input(s): TROPONINI in the last 168 hours. No results for input(s): TROPIPOC in the last 168 hours.   BNPNo results for input(s): BNP, PROBNP in the last 168 hours.   DDimer No results for input(s): DDIMER in the last 168 hours.   Radiology    No results found.  Cardiac Studies   12/14/17: TEE Study Conclusions - Left ventricle: The estimated ejection fraction was 55%. Wall motion was normal; there were no regional wall motion abnormalities. No evidence of thrombus. - Aortic valve: There was trivial regurgitation. - Descending aorta: The descending aorta had mild diffuse disease. - Mitral valve: There was mild regurgitation. - Left atrium: The atrium was moderately dilated. No evidence of thrombus in the atrial cavity or appendage. Emptying velocity was normal. - Right atrium: The atrium was dilated. No evidence of thrombus in the atrial cavity or appendage. -  Atrial septum: The interatrial septum was hypermobile. No defect or patent foramen ovale was identified based on color Doppler evaluation. - Tricuspid valve: There was mild regurgitation. - Impressions: Successful cardioversion. No cardiac source of emboli was identified. Impressions: - Successful cardioversion. No cardiac source of emboli was identified.  Patient Profile     67 y.o. male with PMHx of gout, HTN, obesity, LV dysfunction, and AFib, admitted for Tikosyn initiation  Assessment & Plan    1. Persistent AFib, here for Tikosyn initiation     CHA2DS2Vasc is 2, on Eiquis, appropriately dosed     K+ 4.3     Mag 2.1     Creat 0.98, stable     QTc lengthened, maintaining SR, reduce tikosyn to 242mcg  Anticipate discharge tomorow   2. HTN     Continue home meds  3. Sinus  bradycardia     No coreg last night     Not clearly symptomatic though the patient is quite concerned about it, was on 12.5mg  prior to last AF bout.      He feels most comfortable with reduction of dose, 12.5mg  today  For questions or updates, please contact [place] Please consult [email or web address] for contact info under        Signed, Baldwin Jamaica, PA-C  03/02/2018, 9:19 AM    EP Attending  Patient seen and examined. Agree with above. He remains in NSR. His QT is still ok. We will follow.  Mikle Bosworth.D.

## 2018-03-02 NOTE — Care Management Note (Signed)
Case Management Note  Patient Details  Name: [name] MRN: [id number] Date of Birth: May 07, 1951  Subjective/Objective: Pt presented for Tikosyn Load-persistent atrial fib. PTA Independent from home with wife. Pt uses [facility] [place] and the Pharmacy can order Tikosyn. Pt is aware of co pay and he states that it will be affordable.                    Action/Plan: Pt is agreeable for Tikosyn 7 day supply to be delivered to room [facility]. Pt will need Rx sent to [facility]. An additional Rx will be need for refills. No further needs from CM at this time.  Expected Discharge Date:                  Expected Discharge Plan:  Home/Self Care  In-House Referral:  NA  Discharge planning Services  CM Consult, Medication Assistance  Post Acute Care Choice:  NA Choice offered to:  NA  DME Arranged:  N/A DME Agency:  NA  HH Arranged:  NA HH Agency:  NA  Status of Service:  Completed, signed off  If discussed at [place] of Stay Meetings, dates discussed:    Additional Comments:  Bethena Roys, RN 03/02/2018, 10:15 AM

## 2018-03-02 NOTE — Progress Notes (Signed)
Qtc post 3rd dose of Tikosyn 522.

## 2018-03-03 LAB — MAGNESIUM: Magnesium: 2.1 mg/dL (ref 1.7–2.4)

## 2018-03-03 LAB — BASIC METABOLIC PANEL
Anion gap: 7 (ref 5–15)
BUN: 18 mg/dL (ref 8–23)
CHLORIDE: 107 mmol/L (ref 98–111)
CO2: 25 mmol/L (ref 22–32)
CREATININE: 0.83 mg/dL (ref 0.61–1.24)
Calcium: 8.3 mg/dL — ABNORMAL LOW (ref 8.9–10.3)
GFR calc non Af Amer: >60 mL/min (ref >60)
Glucose, Bld: 97 mg/dL (ref 70–99)
Potassium: 3.8 mmol/L (ref 3.5–5.1)
Sodium: 139 mmol/L (ref 135–145)

## 2018-03-03 MED ORDER — DOFETILIDE 250 MCG PO CAPS: 250.0000 ug | ORAL_CAPSULE | Freq: Two times a day (BID) | ORAL | 6 refills | Status: DC

## 2018-03-03 MED ORDER — POTASSIUM CHLORIDE ER 10 MEQ PO TBCR: 10.0000 meq | EXTENDED_RELEASE_TABLET | Freq: Every day | ORAL | 6 refills | Status: DC

## 2018-03-03 MED ORDER — POTASSIUM CHLORIDE CRYS ER 20 MEQ PO TBCR
40.0000 meq | EXTENDED_RELEASE_TABLET | Freq: Once | ORAL | Status: AC
  Administered 2018-03-03: 40 meq via ORAL
  Filled 2018-03-03: qty 2

## 2018-03-03 MED ORDER — CARVEDILOL 12.5 MG PO TABS: 12.5000 mg | ORAL_TABLET | Freq: Two times a day (BID) | ORAL | 6 refills | Status: DC

## 2018-03-03 MED ORDER — DOFETILIDE 250 MCG PO CAPS: 250.0000 ug | ORAL_CAPSULE | Freq: Two times a day (BID) | ORAL | 0 refills | Status: DC

## 2018-03-03 MED FILL — TIKOSYN 250 MCG CAPS: 250 | 7 days supply | Qty: 14 | Fill #0

## 2018-03-03 NOTE — Discharge Summary (Addendum)
ELECTROPHYSIOLOGY PROCEDURE DISCHARGE SUMMARY    Patient ID: [name],  MRN: [id number], DOB/AGE: 1950/09/24 67 y.o.  Admit date: 02/28/2018 Discharge date: 03/03/2018  Primary Care Physician: Glenda Chroman, MD  Primary Cardiologist: Dr. Bronson Ing Electrophysiologist: Dr. Lovena Le  Primary Discharge Diagnosis:  1.   Persistent atrial fibrillation status post Tikosyn loading this admission          CHA2DS2Vasc is 2, on Xarelto, appropriately dosed  Secondary Discharge Diagnosis:  1. HTN  No Known Allergies   Procedures This Admission:  1.  Tikosyn loading   Brief HPI: [name] is a 67 y.o. male with a past medical history as noted above. He is followed in the out patient setting by EP recommended Tikosyn for AFib rhythm management.  Risks, benefits, and alternatives to Tikosyn were reviewed with the patient who wished to proceed.    Hospital Course:  The patient was admitted and Tikosyn was initiated.  Renal function and electrolytes were followed during the hospitalization.   His potassium required some support, will provide low dose supplementation for home.  His QTc prolonged requiring down-titration to 230mcg, then remained stable.  He arrived in SR and maintained throughout his stay.   He was observed to have SB 40's evening prompting staff to hold his coreg.  Having his HR 40's though without symptoms made the patient quite nervous and his coreg reduced.  On the day of discharge, he feels well. The patient was examined by Dr Lovena Le who considered them stable for discharge to home.  Follow-up has been arranged with the AFib clinic in 1 week and with Dr Lovena Le in 4 weeks.   Physical Exam: Vitals:   03/02/18 2134 03/03/18 0021 03/03/18 0511 03/03/18 0515  BP: 134/76   122/62  Pulse: (!) 55   (!) 57  Resp: 20     Temp: 98.4 F (36.9 C) 98.3 F (36.8 C)  98.1 F (36.7 C)  TempSrc: Oral Oral  Oral  SpO2: 98%   95%  Weight:   127 kg   Height:         GEN- The patient is well appearing, alert and oriented x 3 today.   HEENT: normocephalic, atraumatic; sclera clear, conjunctiva pink; hearing intact; oropharynx clear; neck supple, no JVP Lymph- no cervical lymphadenopathy Lungs- CTA b/l, normal work of breathing.  No wheezes, rales, rhonchi Heart- RRR, no murmurs, rubs or gallops, PMI not laterally displaced GI- soft, non-tender, non-distended Extremities- no clubbing, cyanosis, or edema MS- no significant deformity or atrophy Skin- warm and dry, no rash or lesion Psych- euthymic mood, full affect Neuro- strength and sensation are intact   Labs:   Lab Results  Component Value Date   WBC 8.9 12/15/2017   HGB 12.5 (L) 12/15/2017   HCT 38.2 (L) 12/15/2017   MCV 89.3 12/15/2017   PLT 117 (L) 12/15/2017    Recent Labs  Lab 03/03/18 0352  NA 139  K 3.8  CL 107  CO2 25  BUN 18  CREATININE 0.83  CALCIUM 8.3*  GLUCOSE 97     Discharge Medications:  Allergies as of 03/03/2018   No Known Allergies     Medication List    TAKE these medications   acetaminophen 500 MG tablet Commonly known as:  TYLENOL Take 500-1,000 mg by mouth every 6 (six) hours as needed (for pain.).   allopurinol 300 MG tablet Commonly known as:  ZYLOPRIM Take 300 mg by mouth daily at 3 pm.  CALCIUM + D3 PO Take 1 tablet by mouth daily at 3 pm.   carvedilol 12.5 MG tablet Commonly known as:  COREG Take 1 tablet (12.5 mg total) by mouth 2 (two) times daily. What changed:    medication strength  how much to take   cetirizine 10 MG tablet Commonly known as:  ZYRTEC Take 10 mg by mouth daily as needed for allergies.   CINNAMON PO Take 350 mg by mouth daily at 3 pm.   Coenzyme Q10 200 MG capsule Take 200 mg by mouth daily at 3 pm.   dofetilide 250 MCG capsule Commonly known as:  TIKOSYN Take 1 capsule (250 mcg total) by mouth 2 (two) times daily.   fish oil-omega-3 fatty acids 1000 MG capsule Take 3,000 mg by mouth daily.    GLUCOSAMINE CHONDR COMPLEX PO Take 1 tablet by mouth 3 (three) times a week. 1500mg /1200mg    Iron 18 MG Tbcr Take 18 mg by mouth daily at 3 pm.   LUTEIN-ZEAXANTHIN PO Take 1 tablet by mouth daily at 3 pm.   MULTIVITAMIN PO Take 1 tablet by mouth daily at 3 pm.   NASACORT ALLERGY 24HR 55 MCG/ACT Aero nasal inhaler Generic drug:  triamcinolone Place 2 sprays into the nose daily.   olmesartan 20 MG tablet Commonly known as:  BENICAR Take 1 tablet (20 mg total) by mouth daily. What changed:  when to take this   PHOSPHATIDYLCHOLINE PO Take 100 mg by mouth daily at 3 pm.   potassium chloride 10 MEQ tablet Commonly known as:  K-DUR Take 1 tablet (10 mEq total) by mouth daily.   PROBIOTIC DAILY PO Take 2 tablets by mouth daily at 3 pm. 5 billion unit daily   Resveratrol 100 MG Caps Take 100 mg by mouth daily at 3 pm.   rivaroxaban 20 MG Tabs tablet Commonly known as:  XARELTO Take 1 tablet (20 mg total) by mouth daily with lunch.   tamsulosin 0.4 MG Caps capsule Commonly known as:  FLOMAX Take 0.4 mg by mouth daily at 3 pm.   Turmeric 400 MG Caps Take 400 mg by mouth daily at 3 pm.   Vitamin D3 2000 units Tabs Take 2,000 Units by mouth daily at 3 pm.       Disposition:  Discharge Instructions    Diet - low sodium heart healthy   Complete by:  As directed    Increase activity slowly   Complete by:  As directed      Follow-up Information    [facility] Follow up on 03/10/2018.   Specialty:  Cardiology Why:  9:00AM Contact information: [address] [phone] [facility] [address] [place]       Evans Lance, MD Follow up on 04/01/2018.   Specialty:  Cardiology Why:  10:45AM Contact information: [address] [address] [phone]           Duration of Discharge Encounter: Greater than 30 minutes including physician time.  Venetia Night,  PA-C 03/03/2018 1:09 PM   EP Attending  Patient seen and examined. Agree with above. The patient is stable for DC home. Usual followup as above.  Mikle Bosworth.D.

## 2018-03-03 NOTE — Care Management Important Message (Signed)
Important Message  Patient Details  Name: [name] MRN: [id number] Date of Birth: 02-May-1951   Medicare Important Message Given:  Yes    Galia Rahm P Yaelis Scharfenberg 03/03/2018, 4:21 PM

## 2018-03-03 NOTE — Progress Notes (Addendum)
Telemetry and EKG reviewed Remains in SR/SB, QTc looks OK anticipate discharge this afternoon after last EKG  Tommye Standard, PA-C  EP Attending  Agree with above. Continue dofetilide 250 q12.  Mikle Bosworth.D.

## 2018-03-09 DIAGNOSIS — Z299 Encounter for prophylactic measures, unspecified: Secondary | ICD-10-CM | POA: Diagnosis not present

## 2018-03-09 DIAGNOSIS — Z6835 Body mass index (BMI) 35.0-35.9, adult: Secondary | ICD-10-CM | POA: Diagnosis not present

## 2018-03-09 DIAGNOSIS — I10 Essential (primary) hypertension: Secondary | ICD-10-CM | POA: Diagnosis not present

## 2018-03-09 DIAGNOSIS — K219 Gastro-esophageal reflux disease without esophagitis: Secondary | ICD-10-CM | POA: Diagnosis not present

## 2018-03-09 DIAGNOSIS — I429 Cardiomyopathy, unspecified: Secondary | ICD-10-CM | POA: Diagnosis not present

## 2018-03-09 DIAGNOSIS — I4891 Unspecified atrial fibrillation: Secondary | ICD-10-CM | POA: Diagnosis not present

## 2018-03-09 DIAGNOSIS — Z09 Encounter for follow-up examination after completed treatment for conditions other than malignant neoplasm: Secondary | ICD-10-CM | POA: Diagnosis not present

## 2018-03-10 ENCOUNTER — Ambulatory Visit (HOSPITAL_COMMUNITY)
Admit: 2018-03-10 | Discharge: 2018-03-10 | Disposition: A | Discharge status: Home / Self Care | Payer: Medicare Other | Attending: Nurse Practitioner | Admitting: Nurse Practitioner

## 2018-03-10 ENCOUNTER — Other Ambulatory Visit (HOSPITAL_COMMUNITY): Payer: Self-pay | Admitting: *Deleted

## 2018-03-10 VITALS — BP 134/76 | HR 49 | Ht 74.0 in | Wt 276.0 lb

## 2018-03-10 DIAGNOSIS — Z7901 Long term (current) use of anticoagulants: Secondary | ICD-10-CM | POA: Insufficient documentation

## 2018-03-10 DIAGNOSIS — I4811 Longstanding persistent atrial fibrillation: Secondary | ICD-10-CM

## 2018-03-10 DIAGNOSIS — G629 Polyneuropathy, unspecified: Secondary | ICD-10-CM | POA: Diagnosis not present

## 2018-03-10 DIAGNOSIS — K148 Other diseases of tongue: Secondary | ICD-10-CM | POA: Insufficient documentation

## 2018-03-10 DIAGNOSIS — Z79899 Other long term (current) drug therapy: Secondary | ICD-10-CM | POA: Diagnosis not present

## 2018-03-10 DIAGNOSIS — N400 Enlarged prostate without lower urinary tract symptoms: Secondary | ICD-10-CM | POA: Insufficient documentation

## 2018-03-10 DIAGNOSIS — I480 Paroxysmal atrial fibrillation: Secondary | ICD-10-CM | POA: Diagnosis not present

## 2018-03-10 DIAGNOSIS — M109 Gout, unspecified: Secondary | ICD-10-CM | POA: Diagnosis not present

## 2018-03-10 DIAGNOSIS — I10 Essential (primary) hypertension: Secondary | ICD-10-CM | POA: Insufficient documentation

## 2018-03-10 DIAGNOSIS — M1990 Unspecified osteoarthritis, unspecified site: Secondary | ICD-10-CM | POA: Insufficient documentation

## 2018-03-10 LAB — BASIC METABOLIC PANEL
ANION GAP: 7 (ref 5–15)
BUN: 16 mg/dL (ref 8–23)
CALCIUM: 8.7 mg/dL — AB (ref 8.9–10.3)
CO2: 26 mmol/L (ref 22–32)
Chloride: 109 mmol/L (ref 98–111)
Creatinine, Ser: 0.94 mg/dL (ref 0.61–1.24)
Glucose, Bld: 116 mg/dL — ABNORMAL HIGH (ref 70–99)
Potassium: 3.6 mmol/L (ref 3.5–5.1)
Sodium: 142 mmol/L (ref 135–145)

## 2018-03-10 LAB — MAGNESIUM: Magnesium: 2.1 mg/dL (ref 1.7–2.4)

## 2018-03-10 NOTE — Progress Notes (Signed)
Primary Care Physician: Glenda Chroman, MD Referring Physician:Dr. Corion Sherrod is a 67 y.o. male with a h/o paroxysmal afib that is in the afib clinic for f/u  admission for Tikosyn.He has not noticed any afib since d/c. He is taking tikosyn on a regular basis. He has noted a bad taste in his mouth as well as burning sensation of his tongue. He is new to taking K+ and has been taking on a empty stomach. His PCP thought  symptoms may be more reflux and started PPI. He has not noticed much improvement in just the 2 days taking yet. I do not think these symptoms are from Tikosyn, as I have never heard this from my other pt's or am seeing in potential side effect profile today.  Today, he denies symptoms of palpitations, chest pain, shortness of breath, orthopnea, PND, lower extremity edema, dizziness, presyncope, syncope, or neurologic sequela. The patient is tolerating medications without difficulties and is otherwise without complaint today.   Past Medical History:  Diagnosis Date  . Arthritis    "knuckles maybe" (02/28/2018)  . Atrial fib/flutter, transient    a. s/p TEE-guided DCCV on 12/14/2017 with return to NSR.   [name] BPH (benign prostatic hyperplasia) 12/11/2017  . Essential hypertension   . Gout    "I take RX qd" (02/28/2018)  . Neuropathy   . Pneumonia 11/2017  . Thyroid nodule    Benign   Past Surgical History:  Procedure Laterality Date  . BACK SURGERY    . BIOPSY THYROID    . CARDIOVERSION N/A 12/14/2017   Procedure: CARDIOVERSION;  Surgeon: Satira Sark, MD;  Location: [facility];  Service: Cardiovascular;  Laterality: N/A;  . CATARACT EXTRACTION W/ INTRAOCULAR LENS  IMPLANT, BILATERAL Bilateral 2006-2010   "right-left"  . COLONOSCOPY  2012  . IRRIGATION AND DEBRIDEMENT SEBACEOUS CYST    . LUMBAR LAMINECTOMY N/A 01/20/2013   Procedure: MICRODISCECTOMY LUMBAR LAMINECTOMY;  Surgeon: Marybelle Killings, MD;  Location: [facility];  Service: Orthopedics;  Laterality: N/A;   L4-5 Decompression  . PILONIDAL CYST DRAINAGE    . TEE WITHOUT CARDIOVERSION N/A 12/14/2017   Procedure: TRANSESOPHAGEAL ECHOCARDIOGRAM (TEE) WITH PROPOFOL;  Surgeon: Satira Sark, MD;  Location: [facility];  Service: Cardiovascular;  Laterality: N/A;    Current Outpatient Medications  Medication Sig Dispense Refill  . acetaminophen (TYLENOL) 500 MG tablet Take 500-1,000 mg by mouth every 6 (six) hours as needed (for pain.).    [name] allopurinol (ZYLOPRIM) 300 MG tablet Take 300 mg by mouth daily at 3 pm.     . Calcium Carb-Cholecalciferol (CALCIUM + D3 PO) Take 1 tablet by mouth daily at 3 pm.     . carvedilol (COREG) 12.5 MG tablet Take 1 tablet (12.5 mg total) by mouth 2 (two) times daily. 60 tablet 6  . cetirizine (ZYRTEC) 10 MG tablet Take 10 mg by mouth daily as needed for allergies.    . Cholecalciferol (VITAMIN D3) 2000 UNITS TABS Take 2,000 Units by mouth daily at 3 pm.     . CINNAMON PO Take 350 mg by mouth daily at 3 pm.     . Coenzyme Q10 200 MG capsule Take 200 mg by mouth daily at 3 pm.     . dofetilide (TIKOSYN) 250 MCG capsule Take 1 capsule (250 mcg total) by mouth 2 (two) times daily. 60 capsule 6  . Ferrous Fumarate (IRON) 18 MG TBCR Take 18 mg by mouth daily at 3 pm.     .  fish oil-omega-3 fatty acids 1000 MG capsule Take 3,000 mg by mouth daily.     . Glucosamine-Chondroitin (GLUCOSAMINE CHONDR COMPLEX PO) Take 1 tablet by mouth 3 (three) times a week. 1500mg /1200mg     . lansoprazole (PREVACID) 15 MG capsule Take 15 mg by mouth daily at 12 noon.    . LUTEIN-ZEAXANTHIN PO Take 1 tablet by mouth daily at 3 pm.     . Multiple Vitamins-Minerals (MULTIVITAMIN PO) Take 1 tablet by mouth daily at 3 pm.     . olmesartan (BENICAR) 20 MG tablet Take 1 tablet (20 mg total) by mouth daily. (Patient taking differently: Take 20 mg by mouth daily with lunch. ) 30 tablet 11  . PHOSPHATIDYLCHOLINE PO Take 100 mg by mouth daily at 3 pm.     . potassium chloride (K-DUR) 10 MEQ tablet Take 1  tablet (10 mEq total) by mouth daily. 30 tablet 6  . Probiotic Product (PROBIOTIC DAILY PO) Take 2 tablets by mouth daily at 3 pm. 5 billion unit daily    . RESVERATROL 100 MG CAPS Take 100 mg by mouth daily at 3 pm.     . rivaroxaban (XARELTO) 20 MG TABS tablet Take 1 tablet (20 mg total) by mouth daily with lunch. 30 tablet 11  . tamsulosin (FLOMAX) 0.4 MG CAPS capsule Take 0.4 mg by mouth daily at 3 pm.   5  . triamcinolone (NASACORT ALLERGY 24HR) 55 MCG/ACT AERO nasal inhaler Place 2 sprays into the nose daily.     No current facility-administered medications for this encounter.     No Known Allergies  Social History   Socioeconomic History  . Marital status: Married    Spouse name: Not on file  . Number of children: 3  . Years of education: Not on file  . Highest education level: Not on file  Occupational History  . Not on file  Social Needs  . Financial resource strain: Not on file  . Food insecurity:    Worry: Not on file    Inability: Not on file  . Transportation needs:    Medical: Not on file    Non-medical: Not on file  Tobacco Use  . Smoking status: Never Smoker  . Smokeless tobacco: Never Used  Substance and Sexual Activity  . Alcohol use: Never    Alcohol/week: 0.0 standard drinks    Frequency: Never  . Drug use: Never  . Sexual activity: Not Currently  Lifestyle  . Physical activity:    Days per week: Not on file    Minutes per session: Not on file  . Stress: Not on file  Relationships  . Social connections:    Talks on phone: Not on file    Gets together: Not on file    Attends religious service: Not on file    Active member of club or organization: Not on file    Attends meetings of clubs or organizations: Not on file    Relationship status: Not on file  . Intimate partner violence:    Fear of current or ex partner: Not on file    Emotionally abused: Not on file    Physically abused: Not on file    Forced sexual activity: Not on file  Other  Topics Concern  . Not on file  Social History Narrative  . Not on file    Family History  Problem Relation Age of Onset  . Lung cancer Father   . Cancer - Lung Father   .  Atrial fibrillation Mother   . Dementia Mother   . Transient ischemic attack Mother   . Hypertension Unknown   . Liver cancer Brother     ROS- All systems are reviewed and negative except as per the HPI above  Physical Exam: Vitals:   03/10/18 0922  BP: 134/76  Pulse: (!) 49  Weight: 125.2 kg  Height: 6\' 2"  (1.88 m)   Wt Readings from Last 3 Encounters:  03/10/18 125.2 kg  03/03/18 127 kg  02/28/18 127.9 kg    Labs: Lab Results  Component Value Date   NA 142 03/10/2018   K 3.6 03/10/2018   CL 109 03/10/2018   CO2 26 03/10/2018   GLUCOSE 116 (H) 03/10/2018   BUN 16 03/10/2018   CREATININE 0.94 03/10/2018   CALCIUM 8.7 (L) 03/10/2018   MG 2.1 03/10/2018   Lab Results  Component Value Date   INR 1.08 12/11/2017   No results found for: CHOL, HDL, LDLCALC, TRIG   GEN- The patient is well appearing, alert and oriented x 3 today.   Head- normocephalic, atraumatic Eyes-  Sclera clear, conjunctiva pink Ears- hearing intact Oropharynx- clear Neck- supple, no JVP Lymph- no cervical lymphadenopathy Lungs- Clear to ausculation bilaterally, normal work of breathing Heart- Regular rate and rhythm, no murmurs, rubs or gallops, PMI not laterally displaced GI- soft, NT, ND, + BS Extremities- no clubbing, cyanosis, or edema MS- no significant deformity or atrophy Skin- no rash or lesion Psych- euthymic mood, full affect Neuro- strength and sensation are intact  EKG- NSR at 49 bpm, pr itn 162 ms, qrs int 100 ms, qtc 399 ms (qt stable) Epic records reviewed    Assessment and Plan: 1. Paroxysmal afib  Now on dofetilide 250 mcg bid and staying in SR Taking on a regular basis Precautions reviewed No benadryl use Continue xarelto   Bmet/mag pending  2. Taste altercation/burning sensation of  tongue I doubt this is related to Tikosyn May be 2/2 taking K+ on an empty stomach and contributing to reflux, take with food His PCP started PPI  F/u with Dr. Lovena Le 11/8  Butch Penny C. [facility] [address] [place], [place] [address] [place] Ayzia Day, [facility] [address] [place], [place] [address] [phone]

## 2018-03-15 ENCOUNTER — Encounter: Payer: Self-pay | Admitting: Internal Medicine

## 2018-03-17 ENCOUNTER — Other Ambulatory Visit (HOSPITAL_COMMUNITY)
Admission: RE | Admit: 2018-03-17 | Discharge: 2018-03-17 | Disposition: A | Discharge status: Home / Self Care | Payer: Medicare Other | Source: Ambulatory Visit | Attending: Nurse Practitioner | Admitting: Nurse Practitioner

## 2018-03-17 DIAGNOSIS — I4811 Longstanding persistent atrial fibrillation: Secondary | ICD-10-CM | POA: Diagnosis not present

## 2018-03-17 LAB — BASIC METABOLIC PANEL
Anion gap: 6 (ref 5–15)
BUN: 21 mg/dL (ref 8–23)
CALCIUM: 8.7 mg/dL — AB (ref 8.9–10.3)
CO2: 27 mmol/L (ref 22–32)
Chloride: 107 mmol/L (ref 98–111)
Creatinine, Ser: 0.88 mg/dL (ref 0.61–1.24)
GFR calc Af Amer: >60 mL/min (ref >60)
GFR calc non Af Amer: >60 mL/min (ref >60)
GLUCOSE: 93 mg/dL (ref 70–99)
Potassium: 4.4 mmol/L (ref 3.5–5.1)
Sodium: 140 mmol/L (ref 135–145)

## 2018-03-18 ENCOUNTER — Other Ambulatory Visit (HOSPITAL_COMMUNITY): Payer: Self-pay | Admitting: *Deleted

## 2018-03-18 MED ORDER — POTASSIUM CHLORIDE ER 10 MEQ PO TBCR: 10.0000 meq | EXTENDED_RELEASE_TABLET | Freq: Two times a day (BID) | ORAL | 6 refills | Status: DC

## 2018-04-01 ENCOUNTER — Ambulatory Visit (INDEPENDENT_AMBULATORY_CARE_PROVIDER_SITE_OTHER): Payer: Medicare Other | Admitting: Internal Medicine

## 2018-04-01 ENCOUNTER — Encounter: Payer: Self-pay | Admitting: Internal Medicine

## 2018-04-01 VITALS — BP 122/62 | HR 62 | Ht 74.0 in | Wt 276.0 lb

## 2018-04-01 DIAGNOSIS — I480 Paroxysmal atrial fibrillation: Secondary | ICD-10-CM | POA: Diagnosis not present

## 2018-04-01 DIAGNOSIS — I428 Other cardiomyopathies: Secondary | ICD-10-CM

## 2018-04-01 DIAGNOSIS — I10 Essential (primary) hypertension: Secondary | ICD-10-CM | POA: Diagnosis not present

## 2018-04-01 NOTE — Progress Notes (Signed)
HPI [name] returns today for followup. He is a pleasant 67 yo man with persistent atrial fib who was admitted several weeks ago for dofetilide initiation. He has done well in the interim. No chest pain, sob, or palpitations. He tends to have peripheral edema for which he uses compression stockings. No trouble with dofetilide. He did have some difficulty with this potassium supplements. No Known Allergies   Current Outpatient Medications  Medication Sig Dispense Refill  . acetaminophen (TYLENOL) 500 MG tablet Take 500-1,000 mg by mouth every 6 (six) hours as needed (for pain.).    [name] allopurinol (ZYLOPRIM) 300 MG tablet Take 300 mg by mouth daily at 3 pm.     . Calcium Carb-Cholecalciferol (CALCIUM + D3 PO) Take 1 tablet by mouth daily at 3 pm.     . carvedilol (COREG) 12.5 MG tablet Take 1 tablet (12.5 mg total) by mouth 2 (two) times daily. 60 tablet 6  . cetirizine (ZYRTEC) 10 MG tablet Take 10 mg by mouth daily as needed for allergies.    . Cholecalciferol (VITAMIN D3) 2000 UNITS TABS Take 2,000 Units by mouth daily at 3 pm.     . CINNAMON PO Take 350 mg by mouth daily at 3 pm.     . Coenzyme Q10 200 MG capsule Take 200 mg by mouth daily at 3 pm.     . dofetilide (TIKOSYN) 250 MCG capsule Take 1 capsule (250 mcg total) by mouth 2 (two) times daily. 60 capsule 6  . Ferrous Fumarate (IRON) 18 MG TBCR Take 18 mg by mouth daily at 3 pm.     . fish oil-omega-3 fatty acids 1000 MG capsule Take 3,000 mg by mouth daily.     . Glucosamine-Chondroitin (GLUCOSAMINE CHONDR COMPLEX PO) Take 1 tablet by mouth 3 (three) times a week. 1500mg /1200mg     . LUTEIN-ZEAXANTHIN PO Take 1 tablet by mouth daily at 3 pm.     . Multiple Vitamins-Minerals (MULTIVITAMIN PO) Take 1 tablet by mouth daily at 3 pm.     . olmesartan (BENICAR) 20 MG tablet Take 1 tablet (20 mg total) by mouth daily. (Patient taking differently: Take 20 mg by mouth daily with lunch. ) 30 tablet 11  . PHOSPHATIDYLCHOLINE PO Take  100 mg by mouth daily at 3 pm.     . potassium chloride (K-DUR) 10 MEQ tablet Take 1 tablet (10 mEq total) by mouth 2 (two) times daily. 60 tablet 6  . Probiotic Product (PROBIOTIC DAILY PO) Take 2 tablets by mouth daily at 3 pm. 5 billion unit daily    . RESVERATROL 100 MG CAPS Take 100 mg by mouth daily at 3 pm.     . rivaroxaban (XARELTO) 20 MG TABS tablet Take 1 tablet (20 mg total) by mouth daily with lunch. 30 tablet 11  . tamsulosin (FLOMAX) 0.4 MG CAPS capsule Take 0.4 mg by mouth daily at 3 pm.   5  . triamcinolone (NASACORT ALLERGY 24HR) 55 MCG/ACT AERO nasal inhaler Place 2 sprays into the nose daily as needed.      No current facility-administered medications for this visit.      Past Medical History:  Diagnosis Date  . Arthritis    "knuckles maybe" (02/28/2018)  . Atrial fib/flutter, transient    a. s/p TEE-guided DCCV on 12/14/2017 with return to NSR.   [name] BPH (benign prostatic hyperplasia) 12/11/2017  . Essential hypertension   . Gout    "I take RX qd" (02/28/2018)  .  Neuropathy   . Pneumonia 11/2017  . Thyroid nodule    Benign    ROS:   All systems reviewed and negative except as noted in the HPI.   Past Surgical History:  Procedure Laterality Date  . BACK SURGERY    . BIOPSY THYROID    . CARDIOVERSION N/A 12/14/2017   Procedure: CARDIOVERSION;  Surgeon: Satira Sark, MD;  Location: [facility];  Service: Cardiovascular;  Laterality: N/A;  . CATARACT EXTRACTION W/ INTRAOCULAR LENS  IMPLANT, BILATERAL Bilateral 2006-2010   "right-left"  . COLONOSCOPY  2012  . IRRIGATION AND DEBRIDEMENT SEBACEOUS CYST    . LUMBAR LAMINECTOMY N/A 01/20/2013   Procedure: MICRODISCECTOMY LUMBAR LAMINECTOMY;  Surgeon: Marybelle Killings, MD;  Location: [facility];  Service: Orthopedics;  Laterality: N/A;  L4-5 Decompression  . PILONIDAL CYST DRAINAGE    . TEE WITHOUT CARDIOVERSION N/A 12/14/2017   Procedure: TRANSESOPHAGEAL ECHOCARDIOGRAM (TEE) WITH PROPOFOL;  Surgeon: Satira Sark, MD;   Location: [facility];  Service: Cardiovascular;  Laterality: N/A;     Family History  Problem Relation Age of Onset  . Lung cancer Father   . Cancer - Lung Father   . Atrial fibrillation Mother   . Dementia Mother   . Transient ischemic attack Mother   . Hypertension Unknown   . Liver cancer Brother      Social History   Socioeconomic History  . Marital status: Married    Spouse name: Not on file  . Number of children: 3  . Years of education: Not on file  . Highest education level: Not on file  Occupational History  . Not on file  Social Needs  . Financial resource strain: Not on file  . Food insecurity:    Worry: Not on file    Inability: Not on file  . Transportation needs:    Medical: Not on file    Non-medical: Not on file  Tobacco Use  . Smoking status: Never Smoker  . Smokeless tobacco: Never Used  Substance and Sexual Activity  . Alcohol use: Never    Alcohol/week: 0.0 standard drinks    Frequency: Never  . Drug use: Never  . Sexual activity: Not Currently  Lifestyle  . Physical activity:    Days per week: Not on file    Minutes per session: Not on file  . Stress: Not on file  Relationships  . Social connections:    Talks on phone: Not on file    Gets together: Not on file    Attends religious service: Not on file    Active member of club or organization: Not on file    Attends meetings of clubs or organizations: Not on file    Relationship status: Not on file  . Intimate partner violence:    Fear of current or ex partner: Not on file    Emotionally abused: Not on file    Physically abused: Not on file    Forced sexual activity: Not on file  Other Topics Concern  . Not on file  Social History Narrative  . Not on file     BP 122/62   Pulse 62   Ht 6\' 2"  (1.88 m)   Wt 276 lb (125.2 kg)   BMI 35.44 kg/m   Physical Exam:  Well appearing NAD HEENT: Unremarkable Neck:  No JVD, no thyromegally Lymphatics:  No adenopathy Back:  No CVA  tenderness Lungs:  Clear with no wheezes HEART:  Regular rate rhythm, no murmurs,  no rubs, no clicks Abd:  soft, positive bowel sounds, no organomegally, no rebound, no guarding Ext:  2 plus pulses, no edema, no cyanosis, no clubbing Skin:  No rashes no nodules Neuro:  CN II through XII intact, motor grossly intact  EKG NSR   Assess/Plan: 1. Persistent atrial fib - he is maintaining NSR very nicely and QT is ok. He will continue his current meds. 2. Obesity - he is trying to lose weight. He has increased his activity. 3. HTN - his blood pressure today is well controlled. We will follow.  Mikle Bosworth.D.

## 2018-04-01 NOTE — Patient Instructions (Addendum)

## 2018-04-06 DIAGNOSIS — I10 Essential (primary) hypertension: Secondary | ICD-10-CM | POA: Diagnosis not present

## 2018-04-08 ENCOUNTER — Encounter: Payer: Self-pay | Admitting: *Deleted

## 2018-04-11 ENCOUNTER — Ambulatory Visit: Payer: Medicare Other | Admitting: Cardiovascular Disease

## 2018-05-14 DIAGNOSIS — R05 Cough: Secondary | ICD-10-CM | POA: Diagnosis not present

## 2018-05-14 DIAGNOSIS — J40 Bronchitis, not specified as acute or chronic: Secondary | ICD-10-CM | POA: Diagnosis not present

## 2018-05-23 DIAGNOSIS — Z1339 Encounter for screening examination for other mental health and behavioral disorders: Secondary | ICD-10-CM | POA: Diagnosis not present

## 2018-05-23 DIAGNOSIS — R5383 Other fatigue: Secondary | ICD-10-CM | POA: Diagnosis not present

## 2018-05-23 DIAGNOSIS — Z1331 Encounter for screening for depression: Secondary | ICD-10-CM | POA: Diagnosis not present

## 2018-05-23 DIAGNOSIS — Z0000 Encounter for general adult medical examination without abnormal findings: Secondary | ICD-10-CM | POA: Diagnosis not present

## 2018-05-23 DIAGNOSIS — Z79899 Other long term (current) drug therapy: Secondary | ICD-10-CM | POA: Diagnosis not present

## 2018-05-23 DIAGNOSIS — Z6835 Body mass index (BMI) 35.0-35.9, adult: Secondary | ICD-10-CM | POA: Diagnosis not present

## 2018-05-23 DIAGNOSIS — Z1211 Encounter for screening for malignant neoplasm of colon: Secondary | ICD-10-CM | POA: Diagnosis not present

## 2018-05-23 DIAGNOSIS — Z125 Encounter for screening for malignant neoplasm of prostate: Secondary | ICD-10-CM | POA: Diagnosis not present

## 2018-05-23 DIAGNOSIS — Z299 Encounter for prophylactic measures, unspecified: Secondary | ICD-10-CM | POA: Diagnosis not present

## 2018-05-23 DIAGNOSIS — I10 Essential (primary) hypertension: Secondary | ICD-10-CM | POA: Diagnosis not present

## 2018-05-23 DIAGNOSIS — E7800 Pure hypercholesterolemia, unspecified: Secondary | ICD-10-CM | POA: Diagnosis not present

## 2018-05-23 DIAGNOSIS — Z7189 Other specified counseling: Secondary | ICD-10-CM | POA: Diagnosis not present

## 2018-06-01 DIAGNOSIS — H43813 Vitreous degeneration, bilateral: Secondary | ICD-10-CM | POA: Diagnosis not present

## 2018-06-01 DIAGNOSIS — H35371 Puckering of macula, right eye: Secondary | ICD-10-CM | POA: Diagnosis not present

## 2018-06-23 DIAGNOSIS — I10 Essential (primary) hypertension: Secondary | ICD-10-CM | POA: Diagnosis not present

## 2018-06-28 ENCOUNTER — Encounter: Payer: Self-pay | Admitting: *Deleted

## 2018-06-29 ENCOUNTER — Ambulatory Visit (INDEPENDENT_AMBULATORY_CARE_PROVIDER_SITE_OTHER): Payer: Medicare Other | Admitting: Cardiovascular Disease

## 2018-06-29 ENCOUNTER — Encounter: Payer: Self-pay | Admitting: Cardiovascular Disease

## 2018-06-29 VITALS — BP 162/90 | HR 65 | Ht 74.0 in | Wt 277.0 lb

## 2018-06-29 DIAGNOSIS — I4819 Other persistent atrial fibrillation: Secondary | ICD-10-CM | POA: Diagnosis not present

## 2018-06-29 DIAGNOSIS — Z7901 Long term (current) use of anticoagulants: Secondary | ICD-10-CM

## 2018-06-29 DIAGNOSIS — I428 Other cardiomyopathies: Secondary | ICD-10-CM

## 2018-06-29 DIAGNOSIS — I10 Essential (primary) hypertension: Secondary | ICD-10-CM

## 2018-06-29 MED ORDER — OLMESARTAN MEDOXOMIL 40 MG PO TABS: 40.0000 mg | ORAL_TABLET | Freq: Every day | ORAL | 6 refills | Status: DC

## 2018-06-29 NOTE — Progress Notes (Signed)
SUBJECTIVE: The patient presents for routine follow-up.  He has a history of persistent atrial fibrillation and follows with EP.  He is on dofetilide.  He also has a nonischemic cardiomyopathy.  He has been doing well overall and denies chest pain and palpitations.  He exercises on the treadmill for 45 minutes about 3 to 4 days/week.  He brought in his blood pressure log and blood pressures have been persistently elevated with most systolic readings in the 283-662 range.  He can tell when his blood pressure is high as his head feels flushed and he feels a warm sensation on the back of his neck.  He drinks a cup of coffee every morning.  He tries to avoid excessive caffeine.   Review of Systems: As per "subjective", otherwise negative.  Allergies  Allergen Reactions  . Azithromycin     QT WAVE    Current Outpatient Medications  Medication Sig Dispense Refill  . acetaminophen (TYLENOL) 500 MG tablet Take 500-1,000 mg by mouth every 6 (six) hours as needed (for pain.).    [name] allopurinol (ZYLOPRIM) 300 MG tablet Take 300 mg by mouth daily at 3 pm.     . Calcium Carb-Cholecalciferol (CALCIUM + D3 PO) Take 1 tablet by mouth daily at 3 pm.     . carvedilol (COREG) 12.5 MG tablet Take 1 tablet (12.5 mg total) by mouth 2 (two) times daily. 60 tablet 6  . cetirizine (ZYRTEC) 10 MG tablet Take 10 mg by mouth daily as needed for allergies.    . Cholecalciferol (VITAMIN D3) 2000 UNITS TABS Take 2,000 Units by mouth daily at 3 pm.     . CINNAMON PO Take 350 mg by mouth daily at 3 pm.     . Coenzyme Q10 200 MG capsule Take 200 mg by mouth daily at 3 pm.     . dofetilide (TIKOSYN) 250 MCG capsule Take 1 capsule (250 mcg total) by mouth 2 (two) times daily. 60 capsule 6  . Ferrous Fumarate (IRON) 18 MG TBCR Take 18 mg by mouth daily at 3 pm.     . fish oil-omega-3 fatty acids 1000 MG capsule Take 3,000 mg by mouth daily.     . Glucosamine-Chondroitin (GLUCOSAMINE CHONDR COMPLEX PO) Take 1  tablet by mouth 3 (three) times a week. 1500mg /1200mg     . LUTEIN-ZEAXANTHIN PO Take 1 tablet by mouth daily at 3 pm.     . Multiple Vitamins-Minerals (MULTIVITAMIN PO) Take 1 tablet by mouth daily at 3 pm.     . olmesartan (BENICAR) 20 MG tablet Take 1 tablet (20 mg total) by mouth daily. (Patient taking differently: Take 20 mg by mouth daily with lunch. ) 30 tablet 11  . PHOSPHATIDYLCHOLINE PO Take 100 mg by mouth daily at 3 pm.     . potassium chloride (K-DUR) 10 MEQ tablet Take 1 tablet (10 mEq total) by mouth 2 (two) times daily. 60 tablet 6  . Probiotic Product (PROBIOTIC DAILY PO) Take 2 tablets by mouth daily at 3 pm. 5 billion unit daily    . RESVERATROL 100 MG CAPS Take 100 mg by mouth daily at 3 pm.     . rivaroxaban (XARELTO) 20 MG TABS tablet Take 1 tablet (20 mg total) by mouth daily with lunch. 30 tablet 11  . tamsulosin (FLOMAX) 0.4 MG CAPS capsule Take 0.4 mg by mouth daily at 3 pm.   5  . triamcinolone (NASACORT ALLERGY 24HR) 55 MCG/ACT AERO nasal inhaler Place  2 sprays into the nose daily as needed.      No current facility-administered medications for this visit.     Past Medical History:  Diagnosis Date  . Arthritis    "knuckles maybe" (02/28/2018)  . Atrial fib/flutter, transient    a. s/p TEE-guided DCCV on 12/14/2017 with return to NSR.   [name] BPH (benign prostatic hyperplasia) 12/11/2017  . Essential hypertension   . Gout    "I take RX qd" (02/28/2018)  . Neuropathy   . Pneumonia 11/2017  . Thyroid nodule    Benign    Past Surgical History:  Procedure Laterality Date  . BACK SURGERY    . BIOPSY THYROID    . CARDIOVERSION N/A 12/14/2017   Procedure: CARDIOVERSION;  Surgeon: Satira Sark, MD;  Location: [facility];  Service: Cardiovascular;  Laterality: N/A;  . CATARACT EXTRACTION W/ INTRAOCULAR LENS  IMPLANT, BILATERAL Bilateral 2006-2010   "right-left"  . COLONOSCOPY  2012  . IRRIGATION AND DEBRIDEMENT SEBACEOUS CYST    . LUMBAR LAMINECTOMY N/A 01/20/2013    Procedure: MICRODISCECTOMY LUMBAR LAMINECTOMY;  Surgeon: Marybelle Killings, MD;  Location: [place];  Service: Orthopedics;  Laterality: N/A;  L4-5 Decompression  . PILONIDAL CYST DRAINAGE    . TEE WITHOUT CARDIOVERSION N/A 12/14/2017   Procedure: TRANSESOPHAGEAL ECHOCARDIOGRAM (TEE) WITH PROPOFOL;  Surgeon: Satira Sark, MD;  Location: [facility];  Service: Cardiovascular;  Laterality: N/A;    Social History   Socioeconomic History  . Marital status: Married    Spouse name: Not on file  . Number of children: 3  . Years of education: Not on file  . Highest education level: Not on file  Occupational History  . Not on file  Social Needs  . Financial resource strain: Not on file  . Food insecurity:    Worry: Not on file    Inability: Not on file  . Transportation needs:    Medical: Not on file    Non-medical: Not on file  Tobacco Use  . Smoking status: Never Smoker  . Smokeless tobacco: Never Used  Substance and Sexual Activity  . Alcohol use: Never    Alcohol/week: 0.0 standard drinks    Frequency: Never  . Drug use: Never  . Sexual activity: Not Currently  Lifestyle  . Physical activity:    Days per week: Not on file    Minutes per session: Not on file  . Stress: Not on file  Relationships  . Social connections:    Talks on phone: Not on file    Gets together: Not on file    Attends religious service: Not on file    Active member of club or organization: Not on file    Attends meetings of clubs or organizations: Not on file    Relationship status: Not on file  . Intimate partner violence:    Fear of current or ex partner: Not on file    Emotionally abused: Not on file    Physically abused: Not on file    Forced sexual activity: Not on file  Other Topics Concern  . Not on file  Social History Narrative  . Not on file     Vitals:   06/29/18 1511  BP: (!) 162/90  Pulse: 65  SpO2: 97%  Weight: 277 lb (125.6 kg)  Height: 6\' 2"  (1.88 m)    Wt Readings from Last 3  Encounters:  06/29/18 277 lb (125.6 kg)  04/01/18 276 lb (125.2 kg)  03/10/18 276  lb (125.2 kg)     PHYSICAL EXAM General: NAD HEENT: Normal. Neck: No JVD, no thyromegaly. Lungs: Clear to auscultation bilaterally with normal respiratory effort. CV: Regular rate and rhythm, normal S1/S2, no S3/S4, no murmur. No pretibial or periankle edema.  No carotid bruit.   Abdomen: Soft, nontender, no distention.  Neurologic: Alert and oriented.  Psych: Normal affect. Skin: Normal. Musculoskeletal: No gross deformities.    ECG: Reviewed above under Subjective   Labs: Lab Results  Component Value Date/Time   K 4.4 03/17/2018 03:25 PM   BUN 21 03/17/2018 03:25 PM   CREATININE 0.88 03/17/2018 03:25 PM   ALT 25 01/19/2013 09:14 AM   TSH 2.155 12/11/2017 01:20 PM   HGB 12.5 (L) 12/15/2017 12:18 AM     Lipids: No results found for: LDLCALC, LDLDIRECT, CHOL, TRIG, HDL     ASSESSMENT AND PLAN: 1.  Persistent atrial fibrillation: Symptomatically stable on dofetilide and Xarelto.  He follows with EP.  No changes.  2.  Nonischemic cardiomyopathy: Most recent echocardiogram demonstrated mildly reduced left ventricular systolic function, EF 45 to 50%.  Continue carvedilol and olmesartan.  I will increase olmesartan to 40 mg to control blood pressure.   He does not require diuretics at this time.  He uses compression stockings.  He has been educated on the importance of a low-sodium diet.  3.  Hypertension: Blood pressure is elevated.  I also reviewed his blood pressure log and systolic readings are consistently in the 150-160 range.  I will increase olmesartan to 40 mg.    Disposition: Follow up with Dr. Lovena Le in May 2020.  Follow-up with me 6 months afterwards.   Kate Sable, M.D., F.A.C.C.

## 2018-06-29 NOTE — Patient Instructions (Addendum)
Medication Instructions:   Increase Olmesartan to 40mg  daily.   Continue all other medications.    Labwork: none  Testing/Procedures: none  Follow-Up: Your physician wants you to follow up in: 6 months.  You will receive a reminder letter in the mail one-two months in advance.  If you don't receive a letter, please call our office to schedule the follow up appointment.  (AFTER APPT WITH TAYLOR ON 10/11/18)  Any Other Special Instructions Will Be Listed Below (If Applicable).  If you need a refill on your cardiac medications before your next appointment, please call your pharmacy.

## 2018-07-04 ENCOUNTER — Ambulatory Visit: Payer: Medicare Other | Admitting: Cardiovascular Disease

## 2018-07-10 ENCOUNTER — Encounter (HOSPITAL_COMMUNITY): Payer: Self-pay | Admitting: Emergency Medicine

## 2018-07-10 ENCOUNTER — Other Ambulatory Visit: Payer: Self-pay

## 2018-07-10 ENCOUNTER — Emergency Department (HOSPITAL_COMMUNITY): Payer: Medicare Other

## 2018-07-10 ENCOUNTER — Emergency Department (HOSPITAL_COMMUNITY)
Admission: EM | Admit: 2018-07-10 | Discharge: 2018-07-10 | Disposition: A | Discharge status: Home / Self Care | Payer: Medicare Other | Attending: Emergency Medicine | Admitting: Emergency Medicine

## 2018-07-10 DIAGNOSIS — Z7901 Long term (current) use of anticoagulants: Secondary | ICD-10-CM | POA: Diagnosis not present

## 2018-07-10 DIAGNOSIS — I259 Chronic ischemic heart disease, unspecified: Secondary | ICD-10-CM | POA: Insufficient documentation

## 2018-07-10 DIAGNOSIS — I110 Hypertensive heart disease with heart failure: Secondary | ICD-10-CM | POA: Diagnosis not present

## 2018-07-10 DIAGNOSIS — I4891 Unspecified atrial fibrillation: Secondary | ICD-10-CM

## 2018-07-10 DIAGNOSIS — I5022 Chronic systolic (congestive) heart failure: Secondary | ICD-10-CM | POA: Diagnosis not present

## 2018-07-10 DIAGNOSIS — R002 Palpitations: Secondary | ICD-10-CM | POA: Diagnosis present

## 2018-07-10 LAB — CBC
HEMATOCRIT: 43.9 % (ref 39.0–52.0)
HEMOGLOBIN: 13.9 g/dL (ref 13.0–17.0)
MCH: 28.5 pg (ref 26.0–34.0)
MCHC: 31.7 g/dL (ref 30.0–36.0)
MCV: 90.1 fL (ref 80.0–100.0)
Platelets: 135 10*3/uL — ABNORMAL LOW (ref 150–400)
RBC: 4.87 MIL/uL (ref 4.22–5.81)
RDW: 13.1 % (ref 11.5–15.5)
WBC: 8.9 10*3/uL (ref 4.0–10.5)
nRBC: 0 % (ref 0.0–0.2)

## 2018-07-10 LAB — BASIC METABOLIC PANEL
ANION GAP: 7 (ref 5–15)
BUN: 23 mg/dL (ref 8–23)
CHLORIDE: 110 mmol/L (ref 98–111)
CO2: 23 mmol/L (ref 22–32)
Calcium: 8.4 mg/dL — ABNORMAL LOW (ref 8.9–10.3)
Creatinine, Ser: 1.07 mg/dL (ref 0.61–1.24)
GFR calc Af Amer: >60 mL/min (ref >60)
GFR calc non Af Amer: >60 mL/min (ref >60)
Glucose, Bld: 119 mg/dL — ABNORMAL HIGH (ref 70–99)
Potassium: 4 mmol/L (ref 3.5–5.1)
Sodium: 140 mmol/L (ref 135–145)

## 2018-07-10 LAB — BRAIN NATRIURETIC PEPTIDE: B Natriuretic Peptide: 263 pg/mL — ABNORMAL HIGH (ref 0.0–100.0)

## 2018-07-10 LAB — TROPONIN I: Troponin I: <0.03 ng/mL (ref <0.03)

## 2018-07-10 MED ORDER — ETOMIDATE 2 MG/ML IV SOLN: 10.0000 mg | Freq: Once | INTRAVENOUS | Status: DC

## 2018-07-10 MED ORDER — SODIUM CHLORIDE 0.9% FLUSH: 3.0000 mL | Freq: Once | INTRAVENOUS | Status: DC

## 2018-07-10 NOTE — Discharge Instructions (Signed)
You were seen in the ED today with atrial fibrillation. Your heart returned to a normal rhythm without intervention. Call your Cardiologist on Monday and return to the ED with any new or worsening symptoms.

## 2018-07-10 NOTE — ED Triage Notes (Signed)
Patient c/o a-fib. Per patient felt heart "go out of rhythm on Friday and then felt like it subsided." Per patient has had general fatigue and shortness of breath. Per patient can feel the arrhythmia with exertion and laying down. Denies any chest pain or dizziness. Patient has hx of afib. Per patient takes xarelto.

## 2018-07-10 NOTE — ED Provider Notes (Signed)
Emergency Department Provider Note   I have reviewed the triage vital signs and the nursing notes.   HISTORY  Chief Complaint Irregular Heart Beat   HPI [name] is a 68 y.o. male with PMH of paroxysmal atrial fibrillation on Xarelto, hypertension, and arthritis presents to the emergency department with heart palpitations and lightheadedness for the past 2 days.  Patient has had 2 episodes of A. fib in the past which feels similar to the sensation.  He has been strictly compliant with his Xarelto and other outpatient medications which include Tikosyn and Carvedilol.  Patient had an admission in October 2019 for Tikosyn loading.  He is followed locally by Dr. Bronson Ing and last saw him on 06/29/2018.  During that visit, Olmesartan was increased to 40 mg. No other changes.   The patient states that 2 days ago he noticed abrupt onset heart palpitations.  He felt like they improved significantly but then returned the next day.  Over the past 24 hours he has been feeling fatigued and lightheaded but denies chest pain/pressure.  He denies any fevers or chills.  No productive cough.  No worsening lower extremity edema.  No radiation of symptoms or other modifying factors. Last PO was 10 AM.   Past Medical History:  Diagnosis Date  . Arthritis    "knuckles maybe" (02/28/2018)  . Atrial fib/flutter, transient    a. s/p TEE-guided DCCV on 12/14/2017 with return to NSR.   [name] BPH (benign prostatic hyperplasia) 12/11/2017  . Essential hypertension   . Gout    "I take RX qd" (02/28/2018)  . Neuropathy   . Pneumonia 11/2017  . Thyroid nodule    Benign    Patient Active Problem List   Diagnosis Date Noted  . Visit for monitoring Tikosyn therapy 02/28/2018  . CAD (coronary artery disease)   . Atrial fibrillation with RVR ([place]) 12/11/2017  . Chronic systolic CHF (congestive heart failure) ([place]) 12/11/2017  . HTN (hypertension) 12/11/2017  . Gout 12/11/2017  . BPH (benign prostatic  hyperplasia) 12/11/2017  . 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 Numbness 10/31/2015  . Restless leg 10/31/2015  . Polyneuropathy 10/31/2015  . Anemia, iron deficiency 10/31/2015  . HNP (herniated nucleus pulposus), lumbar 01/20/2013    Class: Diagnosis of  . Abnormal echocardiogram 02/23/2012    Past Surgical History:  Procedure Laterality Date  . BACK SURGERY    . BIOPSY THYROID    . CARDIOVERSION N/A 12/14/2017   Procedure: CARDIOVERSION;  Surgeon: Satira Sark, MD;  Location: [facility];  Service: Cardiovascular;  Laterality: N/A;  . CATARACT EXTRACTION W/ INTRAOCULAR LENS  IMPLANT, BILATERAL Bilateral 2006-2010   "right-left"  . COLONOSCOPY  2012  . IRRIGATION AND DEBRIDEMENT SEBACEOUS CYST    . LUMBAR LAMINECTOMY N/A 01/20/2013   Procedure: MICRODISCECTOMY LUMBAR LAMINECTOMY;  Surgeon: Marybelle Killings, MD;  Location: [place];  Service: Orthopedics;  Laterality: N/A;  L4-5 Decompression  . PILONIDAL CYST DRAINAGE    . TEE WITHOUT CARDIOVERSION N/A 12/14/2017   Procedure: TRANSESOPHAGEAL ECHOCARDIOGRAM (TEE) WITH PROPOFOL;  Surgeon: Satira Sark, MD;  Location: [facility];  Service: Cardiovascular;  Laterality: N/A;    Allergies Azithromycin  Family History  Problem Relation Age of Onset  . Lung cancer Father   . Cancer - Lung Father   . Atrial fibrillation Mother   . Dementia Mother   . Transient ischemic attack Mother   . Hypertension Other   . Liver cancer Brother     Social  History Social History   Tobacco Use  . Smoking status: Never Smoker  . Smokeless tobacco: Never Used  Substance Use Topics  . Alcohol use: Never    Alcohol/week: 0.0 standard drinks    Frequency: Never  . Drug use: Never    Review of Systems  Constitutional: No fever/chills Eyes: No visual changes. ENT: No sore throat. Cardiovascular: Denies chest pain. Positive palpitations and lightheadedness.  Respiratory: Denies shortness of breath. Gastrointestinal: No abdominal pain.  No  nausea, no vomiting.  No diarrhea.  No constipation. Genitourinary: Negative for dysuria. Musculoskeletal: Negative for back pain. Skin: Negative for rash. Neurological: Negative for headaches, focal weakness or numbness.  10-point ROS otherwise negative.  ____________________________________________   PHYSICAL EXAM:  VITAL SIGNS: ED Triage Vitals  Enc Vitals Group     BP 07/10/18 1015 137/87     Pulse Rate 07/10/18 1015 94     Resp 07/10/18 1015 (!) 22     Temp 07/10/18 1015 97.7 F (36.5 C)     Temp Source 07/10/18 1015 Oral     SpO2 07/10/18 1015 98 %     Weight 07/10/18 1016 276 lb (125.2 kg)     Height 07/10/18 1016 6\' 2"  (1.88 m)   Constitutional: Alert and oriented. Well appearing and in no acute distress. Eyes: Conjunctivae are normal. Head: Atraumatic. Nose: No congestion/rhinnorhea. Mouth/Throat: Mucous membranes are moist.  Neck: No stridor. Cardiovascular: Irregularly irregular rhythm. Good peripheral circulation. Grossly normal heart sounds.   Respiratory: Normal respiratory effort.  No retractions. Lungs CTAB. Gastrointestinal: Soft and nontender. No distention.  Musculoskeletal: No lower extremity tenderness with trace pitting edema. No gross deformities of extremities. Neurologic:  Normal speech and language. No gross focal neurologic deficits are appreciated.  Skin:  Skin is warm, dry and intact. No rash noted.  ____________________________________________   LABS (all labs ordered are listed, but only abnormal results are displayed)  Labs Reviewed  BASIC METABOLIC PANEL - Abnormal; Notable for the following components:      Result Value   Glucose, Bld 119 (*)    Calcium 8.4 (*)    All other components within normal limits  CBC - Abnormal; Notable for the following components:   Platelets 135 (*)    All other components within normal limits  BRAIN NATRIURETIC PEPTIDE - Abnormal; Notable for the following components:   B Natriuretic Peptide 263.0  (*)    All other components within normal limits  TROPONIN I   ____________________________________________  EKG   EKG Interpretation  Date/Time:  Sunday July 10 2018 10:38:32 EST Ventricular Rate:  118 PR Interval:    QRS Duration: 98 QT Interval:  331 QTC Calculation: 464 R Axis:   17 Text Interpretation:  Atrial fibrillation Posterior infarct, old Borderline T abnormalities, inferior leads No STEMI.  Confirmed by Nanda Quinton [phone]) on 07/10/2018 11:07:37 AM       ____________________________________________  RADIOLOGY  Dg Chest 2 View  Result Date: 07/10/2018 CLINICAL DATA:  Atrial fibrillation.  Palpitations. EXAM: CHEST - 2 VIEW COMPARISON:  December 14, 2017 FINDINGS: The heart size and mediastinal contours are within normal limits. Both lungs are clear. The visualized skeletal structures are unremarkable. IMPRESSION: No active cardiopulmonary disease. Electronically Signed   By: Dorise Bullion III M.D   On: 07/10/2018 12:27    ____________________________________________   PROCEDURES  Procedure(s) performed:   Procedures  None  ____________________________________________   INITIAL IMPRESSION / ASSESSMENT AND PLAN / ED COURSE  Pertinent labs & imaging results  that were available during my care of the patient were reviewed by me and considered in my medical decision making (see chart for details).  Patient presents to the emergency department with A. fib with RVR.  No hypotension.  Patient awake, alert, in no distress.  He has been compliant with his Xarelto.  Patient also on Tikosyn, carvedilol, Benicar.  Patient seems to be a good candidate for emergency department cardioversion.  I will discuss with the cardiologist on-call given his Tikosyn history and discuss rate control vs cardioversion.   12:15 PM  Spoke with Dr. Johnsie Cancel with cardiology.  Patient is a good cardioversion candidate.  Plan for follow-up as an outpatient.  No medication adjustment at  this time.  Labs reviewed with no acute findings.  Chest x-ray is normal.  I went back to update the patient regarding the plan to proceed with cardioversion and he had spontaneously converted to normal sinus rhythm, rate of 60. EKG transferred from monitor. No palpitation symptoms.  Plan for discharge and advised return if symptoms acutely worsen but will need to follow-up with cardiology as an outpatient for any medication adjustments as needed.  ____________________________________________  FINAL CLINICAL IMPRESSION(S) / ED DIAGNOSES  Final diagnoses:  Atrial fibrillation with rapid ventricular response ([facility])     MEDICATIONS GIVEN DURING THIS VISIT:  Medications  sodium chloride flush (NS) 0.9 % injection 3 mL (3 mLs Intravenous Not Given 07/10/18 1046)  etomidate (AMIDATE) injection 10 mg (has no administration in time range)    Note:  This document was prepared using Dragon voice recognition software and may include unintentional dictation errors.  Nanda Quinton, MD Emergency Medicine    Mi Balla, [facility], MD 07/10/18 [phone]

## 2018-07-12 ENCOUNTER — Encounter

## 2018-07-12 DIAGNOSIS — I10 Essential (primary) hypertension: Secondary | ICD-10-CM | POA: Diagnosis not present

## 2018-07-12 DIAGNOSIS — I4891 Unspecified atrial fibrillation: Secondary | ICD-10-CM | POA: Diagnosis not present

## 2018-07-12 DIAGNOSIS — Z6835 Body mass index (BMI) 35.0-35.9, adult: Secondary | ICD-10-CM | POA: Diagnosis not present

## 2018-07-12 DIAGNOSIS — Z299 Encounter for prophylactic measures, unspecified: Secondary | ICD-10-CM | POA: Diagnosis not present

## 2018-07-12 DIAGNOSIS — I429 Cardiomyopathy, unspecified: Secondary | ICD-10-CM | POA: Diagnosis not present

## 2018-07-19 DIAGNOSIS — I10 Essential (primary) hypertension: Secondary | ICD-10-CM | POA: Diagnosis not present

## 2018-07-21 ENCOUNTER — Encounter: Payer: Self-pay | Admitting: Cardiovascular Disease

## 2018-07-21 ENCOUNTER — Ambulatory Visit (INDEPENDENT_AMBULATORY_CARE_PROVIDER_SITE_OTHER): Payer: Medicare Other | Admitting: Cardiovascular Disease

## 2018-07-21 VITALS — BP 143/89 | HR 54 | Ht 74.0 in | Wt 273.0 lb

## 2018-07-21 DIAGNOSIS — I428 Other cardiomyopathies: Secondary | ICD-10-CM | POA: Diagnosis not present

## 2018-07-21 DIAGNOSIS — I4819 Other persistent atrial fibrillation: Secondary | ICD-10-CM

## 2018-07-21 DIAGNOSIS — I10 Essential (primary) hypertension: Secondary | ICD-10-CM

## 2018-07-21 DIAGNOSIS — Z9289 Personal history of other medical treatment: Secondary | ICD-10-CM | POA: Diagnosis not present

## 2018-07-21 MED ORDER — RIVAROXABAN 20 MG PO TABS: 20.0000 mg | ORAL_TABLET | Freq: Every day | ORAL | Status: DC

## 2018-07-21 MED ORDER — AMLODIPINE BESYLATE 5 MG PO TABS: 5.0000 mg | ORAL_TABLET | Freq: Every day | ORAL | 6 refills | Status: DC

## 2018-07-21 NOTE — Progress Notes (Signed)
SUBJECTIVE: The patient presents for post ED follow-up.  He was evaluated there for rapid atrial fibrillation.  He has been compliant with dofetilide, carvedilol, and Xarelto.  The plan was for cardioversion but he spontaneously converted to sinus rhythm.  ECG performed today shows sinus bradycardia, 52 bpm, with nonspecific T wave abnormalities.  ECG on 07/10/2018 showed rapid atrial fibrillation, 124 bpm.  He has a nonischemic cardiomyopathy.  However, TEE on 12/14/2017 showed normal left ventricular systolic function, LVEF 19%.  TTE on 12/12/2017 showed EF 45 to 50%.  He underwent cardioversion at that time as well.  He now feels well.  He denies chest pain, palpitations, and shortness of breath.  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  She thinks that is why he went into rapid atrial fibrillation.  He is trying to do some more walking on the treadmill for about 20 minutes a day.  After his ED visit he remained hypertensive so his PCP increase carvedilol to 18.75 mg twice daily.  He brought in a blood pressure log which I reviewed and he is still hypertensive.    Review of Systems: As per "subjective", otherwise negative.  Allergies  Allergen Reactions  . Azithromycin     QT WAVE    Current Outpatient Medications  Medication Sig Dispense Refill  . acetaminophen (TYLENOL) 500 MG tablet Take 500-1,000 mg by mouth every 6 (six) hours as needed (for pain.).    [name] allopurinol (ZYLOPRIM) 300 MG tablet Take 300 mg by mouth daily as needed.     . Calcium Carb-Cholecalciferol (CALCIUM + D3 PO) Take 1 tablet by mouth daily at 3 pm.     . carvedilol (COREG) 12.5 MG tablet Take 18.75 mg by mouth 2 (two) times daily with a meal.    . cetirizine (ZYRTEC) 10 MG tablet Take 10 mg by mouth daily as needed for allergies.    . Cholecalciferol (VITAMIN D3) 2000 UNITS TABS Take 2,000 Units by mouth daily at 3 pm.      . CINNAMON PO Take 350 mg by mouth daily at 3 pm.     . Coenzyme Q10 200 MG capsule Take 200 mg by mouth daily at 3 pm.     . dofetilide (TIKOSYN) 250 MCG capsule Take 1 capsule (250 mcg total) by mouth 2 (two) times daily. 60 capsule 6  . Ferrous Fumarate (IRON) 18 MG TBCR Take 18 mg by mouth daily at 3 pm.     . fish oil-omega-3 fatty acids 1000 MG capsule Take 3,000 mg by mouth daily.     . Glucosamine-Chondroitin (GLUCOSAMINE CHONDR COMPLEX PO) Take 1 tablet by mouth 3 (three) times a week. 1500mg /1200mg     . LUTEIN-ZEAXANTHIN PO Take 1 tablet by mouth daily at 3 pm.     . Multiple Vitamins-Minerals (MULTIVITAMIN PO) Take 1 tablet by mouth daily at 3 pm.     . olmesartan (BENICAR) 40 MG tablet Take 1 tablet (40 mg total) by mouth daily. 30 tablet 6  . PHOSPHATIDYLCHOLINE PO Take 100 mg by mouth daily at 3 pm.     . potassium chloride (K-DUR) 10 MEQ tablet Take 1 tablet (10 mEq total) by mouth 2 (two) times daily. 60 tablet 6  . Probiotic Product (PROBIOTIC DAILY PO) Take 2 tablets by mouth daily at 3 pm. 5 billion unit daily    . RESVERATROL 100 MG CAPS Take  100 mg by mouth daily at 3 pm.     . rivaroxaban (XARELTO) 20 MG TABS tablet Take 1 tablet (20 mg total) by mouth daily with lunch. 30 tablet 11  . tamsulosin (FLOMAX) 0.4 MG CAPS capsule Take 0.4 mg by mouth daily as needed.   5   No current facility-administered medications for this visit.     Past Medical History:  Diagnosis Date  . Arthritis    "knuckles maybe" (02/28/2018)  . Atrial fib/flutter, transient    a. s/p TEE-guided DCCV on 12/14/2017 with return to NSR.   [name] BPH (benign prostatic hyperplasia) 12/11/2017  . Essential hypertension   . Gout    "I take RX qd" (02/28/2018)  . Neuropathy   . Pneumonia 11/2017  . Thyroid nodule    Benign    Past Surgical History:  Procedure Laterality Date  . BACK SURGERY    . BIOPSY THYROID    . CARDIOVERSION N/A 12/14/2017   Procedure: CARDIOVERSION;  Surgeon: Satira Sark, MD;  Location: [facility];  Service: Cardiovascular;  Laterality: N/A;  . CATARACT EXTRACTION W/ INTRAOCULAR LENS  IMPLANT, BILATERAL Bilateral 2006-2010   "right-left"  . COLONOSCOPY  2012  . IRRIGATION AND DEBRIDEMENT SEBACEOUS CYST    . LUMBAR LAMINECTOMY N/A 01/20/2013   Procedure: MICRODISCECTOMY LUMBAR LAMINECTOMY;  Surgeon: Marybelle Killings, MD;  Location: [facility];  Service: Orthopedics;  Laterality: N/A;  L4-5 Decompression  . PILONIDAL CYST DRAINAGE    . TEE WITHOUT CARDIOVERSION N/A 12/14/2017   Procedure: TRANSESOPHAGEAL ECHOCARDIOGRAM (TEE) WITH PROPOFOL;  Surgeon: Satira Sark, MD;  Location: [facility];  Service: Cardiovascular;  Laterality: N/A;    Social History   Socioeconomic History  . Marital status: Married    Spouse name: Not on file  . Number of children: 3  . Years of education: Not on file  . Highest education level: Not on file  Occupational History  . Not on file  Social Needs  . Financial resource strain: Not on file  . Food insecurity:    Worry: Not on file    Inability: Not on file  . Transportation needs:    Medical: Not on file    Non-medical: Not on file  Tobacco Use  . Smoking status: Never Smoker  . Smokeless tobacco: Never Used  Substance and Sexual Activity  . Alcohol use: Never    Alcohol/week: 0.0 standard drinks    Frequency: Never  . Drug use: Never  . Sexual activity: Not Currently  Lifestyle  . Physical activity:    Days per week: Not on file    Minutes per session: Not on file  . Stress: Not on file  Relationships  . Social connections:    Talks on phone: Not on file    Gets together: Not on file    Attends religious service: Not on file    Active member of club or organization: Not on file    Attends meetings of clubs or organizations: Not on file    Relationship status: Not on file  . Intimate partner violence:    Fear of current or ex partner: Not on file    Emotionally abused: Not on file    Physically abused: Not  on file    Forced sexual activity: Not on file  Other Topics Concern  . Not on file  Social History Narrative  . Not on file     Vitals:   07/21/18 1117  BP: (!) 143/89  Pulse: (!) 54  SpO2: 99%  Weight: 273 lb (123.8 kg)  Height: 6\' 2"  (1.88 m)    Wt Readings from Last 3 Encounters:  07/21/18 273 lb (123.8 kg)  07/10/18 276 lb (125.2 kg)  06/29/18 277 lb (125.6 kg)     PHYSICAL EXAM General: NAD HEENT: Normal. Neck: No JVD, no thyromegaly. Lungs: Clear to auscultation bilaterally with normal respiratory effort. CV: Bradycardic, regular rhythm, normal S1/S2, no S3/S4, no murmur. No pretibial or periankle edema.  No carotid bruit.   Abdomen: Soft, nontender, no distention.  Neurologic: Alert and oriented.  Psych: Normal affect. Skin: Normal. Musculoskeletal: No gross deformities.    ECG: Reviewed above under Subjective   Labs: Lab Results  Component Value Date/Time   K 4.0 07/10/2018 10:19 AM   BUN 23 07/10/2018 10:19 AM   CREATININE 1.07 07/10/2018 10:19 AM   ALT 25 01/19/2013 09:14 AM   TSH 2.155 12/11/2017 01:20 PM   HGB 13.9 07/10/2018 10:19 AM     Lipids: No results found for: LDLCALC, LDLDIRECT, CHOL, TRIG, HDL     ASSESSMENT AND PLAN: 1.  Persistent atrial fibrillation: He had a recent bout of rapid atrial fibrillation and spontaneously cardioverted in the ED on 07/10/2018.  He is on dofetilide, carvedilol, and Xarelto.  His blood pressure is elevated and I will add amlodipine 5 mg.  I will have him see Dr. Lovena Le in the near future to assess his candidacy for ablation.  2.  Nonischemic cardiomyopathy: TEE on 12/14/2017 showed normal left ventricular systolic function, LVEF 56%.  TTE on 12/12/2017 showed EF 45 to 50%.   Currently on carvedilol and olmesartan.  He does not require diuretics.  He uses compression stockings.  3.  Hypertension: BP is mildly elevated.  I recently increased the dose of olmesartan to 40 mg on 06/29/2018.  PCP recently increase  carvedilol to 18.75 mg twice daily.  His blood pressure log since then indicates uncontrolled hypertension.  I will add amlodipine 5 mg daily.    Disposition: Follow up with Dr. Lovena Le in the near future.  Follow-up with me as needed.   Kate Sable, M.D., F.A.C.C.

## 2018-07-21 NOTE — Patient Instructions (Signed)
Medication Instructions:   Add Amlodipine 5mg  daily at lunch.   Please take your Xarelto at dinner time.  Continue all other medications.    Labwork:   Testing/Procedures:   Follow-Up:  Dr. Bronson Ing - as needed  Dr. Lovena Le - will try to move up appointment.   Any Other Special Instructions Will Be Listed Below (If Applicable).  If you need a refill on your cardiac medications before your next appointment, please call your pharmacy.

## 2018-08-24 ENCOUNTER — Ambulatory Visit: Payer: Medicare Other | Admitting: Internal Medicine

## 2018-08-31 ENCOUNTER — Other Ambulatory Visit: Payer: Self-pay | Admitting: Physician Assistant

## 2018-08-31 DIAGNOSIS — I10 Essential (primary) hypertension: Secondary | ICD-10-CM | POA: Diagnosis not present

## 2018-09-07 ENCOUNTER — Other Ambulatory Visit (HOSPITAL_COMMUNITY): Payer: Self-pay | Admitting: Nurse Practitioner

## 2018-09-22 DIAGNOSIS — I4891 Unspecified atrial fibrillation: Secondary | ICD-10-CM | POA: Diagnosis not present

## 2018-09-22 DIAGNOSIS — Z6834 Body mass index (BMI) 34.0-34.9, adult: Secondary | ICD-10-CM | POA: Diagnosis not present

## 2018-09-22 DIAGNOSIS — Z299 Encounter for prophylactic measures, unspecified: Secondary | ICD-10-CM | POA: Diagnosis not present

## 2018-09-22 DIAGNOSIS — I10 Essential (primary) hypertension: Secondary | ICD-10-CM | POA: Diagnosis not present

## 2018-09-22 DIAGNOSIS — I429 Cardiomyopathy, unspecified: Secondary | ICD-10-CM | POA: Diagnosis not present

## 2018-09-22 DIAGNOSIS — R202 Paresthesia of skin: Secondary | ICD-10-CM | POA: Diagnosis not present

## 2018-10-03 DIAGNOSIS — I10 Essential (primary) hypertension: Secondary | ICD-10-CM | POA: Diagnosis not present

## 2018-10-11 ENCOUNTER — Ambulatory Visit: Payer: Medicare Other | Admitting: Internal Medicine

## 2018-10-13 ENCOUNTER — Telehealth: Payer: Self-pay | Admitting: Internal Medicine

## 2018-10-13 NOTE — Telephone Encounter (Signed)
Virtual Visit Pre-Appointment Phone Call  "(Name), I am calling you today to discuss your upcoming appointment. We are currently trying to limit exposure to the virus that causes COVID-19 by seeing patients at home rather than in the office."  1. "What is the BEST phone number to call the day of the visit?" - include this in appointment notes  2. Do you have or have access to (through a family member/friend) a smartphone with video capability that we can use for your visit?" a. If yes - list this number in appt notes as cell (if different from BEST phone #) and list the appointment type as a VIDEO visit in appointment notes b. If no - list the appointment type as a PHONE visit in appointment notes  3. Confirm consent - "In the setting of the current Covid19 crisis, you are scheduled for a (phone or video) visit with your provider on (date) at (time).  Just as we do with many in-office visits, in order for you to participate in this visit, we must obtain consent.  If you'd like, I can send this to your mychart (if signed up) or email for you to review.  Otherwise, I can obtain your verbal consent now.  All virtual visits are billed to your insurance company just like a normal visit would be.  By agreeing to a virtual visit, we'd like you to understand that the technology does not allow for your provider to perform an examination, and thus may limit your provider's ability to fully assess your condition. If your provider identifies any concerns that need to be evaluated in person, we will make arrangements to do so.  Finally, though the technology is pretty good, we cannot assure that it will always work on either your or our end, and in the setting of a video visit, we may have to convert it to a phone-only visit.  In either situation, we cannot ensure that we have a secure connection.  Are you willing to proceed?" STAFF: Did the patient verbally acknowledge consent to telehealth visit? Document  YES/NO here: Yes  4. Advise patient to be prepared - "Two hours prior to your appointment, go ahead and check your blood pressure, pulse, oxygen saturation, and your weight (if you have the equipment to check those) and write them all down. When your visit starts, your provider will ask you for this information. If you have an Apple Watch or Kardia device, please plan to have heart rate information ready on the day of your appointment. Please have a pen and paper handy nearby the day of the visit as well."  5. Give patient instructions for MyChart download to smartphone OR [email or web address] as below if video visit (depending on what platform provider is using)  6. Inform patient they will receive a phone call 15 minutes prior to their appointment time (may be from unknown caller ID) so they should be prepared to answer    TELEPHONE CALL NOTE  [name] has been deemed a candidate for a follow-up tele-health visit to limit community exposure during the Covid-19 pandemic. I spoke with the patient via phone to ensure availability of phone/video source, confirm preferred email & phone number, and discuss instructions and expectations.  I reminded [name] to be prepared with any vital sign and/or heart rhythm information that could potentially be obtained via home monitoring, at the time of his visit. I reminded [name] to expect a phone call prior to  his visit.  Orinda Kenner 10/13/2018 12:51 PM

## 2018-10-19 ENCOUNTER — Telehealth (INDEPENDENT_AMBULATORY_CARE_PROVIDER_SITE_OTHER): Payer: Medicare Other | Admitting: Internal Medicine

## 2018-10-19 ENCOUNTER — Other Ambulatory Visit: Payer: Self-pay

## 2018-10-19 VITALS — BP 136/72 | HR 57 | Ht 74.0 in | Wt 266.0 lb

## 2018-10-19 DIAGNOSIS — I10 Essential (primary) hypertension: Secondary | ICD-10-CM | POA: Diagnosis not present

## 2018-10-19 DIAGNOSIS — I4819 Other persistent atrial fibrillation: Secondary | ICD-10-CM

## 2018-10-19 NOTE — Progress Notes (Signed)
Electrophysiology TeleHealth Note   Due to national recommendations of social distancing due to COVID 19, an audio/video telehealth visit is felt to be most appropriate for this patient at this time.  See MyChart message from today for the patient's consent to telehealth for [facility].   Date:  10/19/2018   ID:  [name], DOB 1950-11-07, MRN [id number]  Location: patient's home  Provider location: [address] [place]  Evaluation Performed: Follow-up visit  PCP:  Glenda Chroman, MD  Cardiologist:  Kate Sable, MD  Electrophysiologist:  Dr Lovena Le  Chief Complaint:  "My heart races sometimes."  History of Present Illness:    [name] is a 68 y.o. male who presents via audio/video conferencing for a telehealth visit today.Hehas a h/o PAF and HTN. He feels palpitations.   Since last being seen in our clinic, the patient reports doing well. He walks 5 times a week without limitation. Today, he denies symptoms of palpitations, chest pain, shortness of breath,  lower extremity edema, dizziness, presyncope, or syncope.  The patient is otherwise without complaint today.  The patient denies symptoms of fevers, chills, cough, or new SOB worrisome for COVID 19.  Past Medical History:  Diagnosis Date  . Arthritis    "knuckles maybe" (02/28/2018)  . Atrial fib/flutter, transient    a. s/p TEE-guided DCCV on 12/14/2017 with return to NSR.   [name] BPH (benign prostatic hyperplasia) 12/11/2017  . Essential hypertension   . Gout    "I take RX qd" (02/28/2018)  . Neuropathy   . Pneumonia 11/2017  . Thyroid nodule    Benign    Past Surgical History:  Procedure Laterality Date  . BACK SURGERY    . BIOPSY THYROID    . CARDIOVERSION N/A 12/14/2017   Procedure: CARDIOVERSION;  Surgeon: Satira Sark, MD;  Location: [facility];  Service: Cardiovascular;  Laterality: N/A;  . CATARACT EXTRACTION W/ INTRAOCULAR LENS  IMPLANT, BILATERAL Bilateral 2006-2010   "right-left"  . COLONOSCOPY  2012  . IRRIGATION AND DEBRIDEMENT SEBACEOUS CYST    . LUMBAR LAMINECTOMY N/A 01/20/2013   Procedure: MICRODISCECTOMY LUMBAR LAMINECTOMY;  Surgeon: Marybelle Killings, MD;  Location: [facility];  Service: Orthopedics;  Laterality: N/A;  L4-5 Decompression  . PILONIDAL CYST DRAINAGE    . TEE WITHOUT CARDIOVERSION N/A 12/14/2017   Procedure: TRANSESOPHAGEAL ECHOCARDIOGRAM (TEE) WITH PROPOFOL;  Surgeon: Satira Sark, MD;  Location: [facility];  Service: Cardiovascular;  Laterality: N/A;    Current Outpatient Medications  Medication Sig Dispense Refill  . acetaminophen (TYLENOL) 500 MG tablet Take 500-1,000 mg by mouth every 6 (six) hours as needed (for pain.).    [name] allopurinol (ZYLOPRIM) 300 MG tablet Take 300 mg by mouth daily as needed.     [name] amLODipine (NORVASC) 5 MG tablet Take 1 tablet (5 mg total) by mouth daily. (LUNCH TIME) 30 tablet 6  . Calcium Carb-Cholecalciferol (CALCIUM + D3 PO) Take 1 tablet by mouth daily at 3 pm.     . carvedilol (COREG) 12.5 MG tablet Take 18.75 mg by mouth 2 (two) times daily with a meal.    . cetirizine (ZYRTEC) 10 MG tablet Take 10 mg by mouth daily as needed for allergies.    . Cholecalciferol (VITAMIN D3) 2000 UNITS TABS Take 2,000 Units by mouth daily at 3 pm.     . CINNAMON PO Take 350 mg by mouth daily at 3 pm.     . Coenzyme Q10  200 MG capsule Take 200 mg by mouth daily at 3 pm.     . dofetilide (TIKOSYN) 250 MCG capsule TAKE ONE CAPSULE BY MOUTH TWICE DAILY. 60 capsule 6  . Ferrous Fumarate (IRON) 18 MG TBCR Take 18 mg by mouth daily at 3 pm.     . fish oil-omega-3 fatty acids 1000 MG capsule Take 3,000 mg by mouth daily.     . Glucosamine-Chondroitin (GLUCOSAMINE CHONDR COMPLEX PO) Take 1 tablet by mouth 3 (three) times a week. 1500mg /1200mg     . LUTEIN-ZEAXANTHIN PO Take 1 tablet by mouth daily at 3 pm.     . Multiple Vitamins-Minerals (MULTIVITAMIN PO) Take 1 tablet by mouth daily at 3 pm.     . olmesartan (BENICAR) 40 MG  tablet Take 1 tablet (40 mg total) by mouth daily. 30 tablet 6  . PHOSPHATIDYLCHOLINE PO Take 100 mg by mouth daily at 3 pm.     . potassium chloride (K-DUR,KLOR-CON) 10 MEQ tablet TAKE ONE TABLET BY MOUTH TWICE DAILY. 60 tablet 6  . Probiotic Product (PROBIOTIC DAILY PO) Take 2 tablets by mouth daily at 3 pm. 5 billion unit daily    . RESVERATROL 100 MG CAPS Take 100 mg by mouth daily at 3 pm.     . rivaroxaban (XARELTO) 20 MG TABS tablet Take 1 tablet (20 mg total) by mouth daily with supper.    . tamsulosin (FLOMAX) 0.4 MG CAPS capsule Take 0.4 mg by mouth daily as needed.   5   No current facility-administered medications for this visit.     Allergies:   Azithromycin   Social History:  The patient  reports that he has never smoked. He has never used smokeless tobacco. He reports that he does not drink alcohol or use drugs.   Family History:  The patient's  family history includes Atrial fibrillation in his mother; Cancer - Lung in his father; Dementia in his mother; Hypertension in an other family member; Liver cancer in his brother; Lung cancer in his father; Transient ischemic attack in his mother.   ROS:  Please see the history of present illness.   All other systems are personally reviewed and negative.    Exam:    Vital Signs:  BP 136/72   Pulse (!) 57   Ht 6\' 2"  (1.88 m)   Wt 266 lb (120.7 kg)   BMI 34.15 kg/m   Well appearing, alert and conversant, regular work of breathing,  good skin color Eyes- anicteric, neuro- grossly intact, skin- no apparent rash or lesions or cyanosis, mouth- oral mucosa is pink   Labs/Other Tests and Data Reviewed:    Recent Labs: 12/11/2017: TSH 2.155 03/10/2018: Magnesium 2.1 07/10/2018: B Natriuretic Peptide 263.0; BUN 23; Creatinine, Ser 1.07; Hemoglobin 13.9; Platelets 135; Potassium 4.0; Sodium 140   Wt Readings from Last 3 Encounters:  10/19/18 266 lb (120.7 kg)  07/21/18 273 lb (123.8 kg)  07/10/18 276 lb (125.2 kg)     Other  studies personally reviewed:   ASSESSMENT & PLAN:    1.  Persistent atrial fib - he has had 2 symptomatic episodes since his last visit. I asked him to continue dofetilide. 2. HTN - his bp has improved and is usually in the 130 range. 3. Palpitations - we discussed his purchasing a kardia device. I did not recommend. 4. COVID 19 screen The patient denies symptoms of COVID 19 at this time.  The importance of social distancing was discussed today.  Follow-up:  4  months with me Next remote: n/a  Current medicines are reviewed at length with the patient today.   The patient does not have concerns regarding his medicines.  The following changes were made today:  none  Labs/ tests ordered today include: none No orders of the defined types were placed in this encounter.    Patient Risk:  after full review of this patients clinical status, I feel that they are at moderate risk at this time.  Today, I have spent 25 minutes with the patient with telehealth technology discussing all of the above .    Signed, Cristopher Peru, MD  10/19/2018 9:41 AM     [place] [address] [phone] (office) [phone] (fax)

## 2018-10-19 NOTE — Patient Instructions (Signed)
Medication Instructions:  Your physician recommends that you continue on your current medications as directed. Please refer to the Current Medication list given to you today.   Labwork: NONE  Testing/Procedures: NONE  Follow-Up: Your physician recommends that you schedule a follow-up appointment in: 4 Months with Dr. Lovena Le.   Any Other Special Instructions Will Be Listed Below (If Applicable).     If you need a refill on your cardiac medications before your next appointment, please call your pharmacy.  Thank you for choosing [facility]!

## 2018-11-03 ENCOUNTER — Ambulatory Visit (INDEPENDENT_AMBULATORY_CARE_PROVIDER_SITE_OTHER): Payer: Medicare Other

## 2018-11-03 ENCOUNTER — Ambulatory Visit (INDEPENDENT_AMBULATORY_CARE_PROVIDER_SITE_OTHER): Payer: Medicare Other | Admitting: Orthopaedic Surgery

## 2018-11-03 ENCOUNTER — Encounter: Payer: Self-pay | Admitting: Orthopaedic Surgery

## 2018-11-03 ENCOUNTER — Other Ambulatory Visit: Payer: Self-pay

## 2018-11-03 VITALS — BP 138/77 | HR 65 | Ht 74.0 in | Wt 275.0 lb

## 2018-11-03 DIAGNOSIS — M542 Cervicalgia: Secondary | ICD-10-CM

## 2018-11-03 NOTE — Progress Notes (Signed)
Office Visit Note   Patient: [name]           Date of Birth: Apr 14, 1951           MRN: [id number] Visit Date: 11/03/2018              Requested by: Glenda Chroman, MD [place],  [place] [address] PCP: Glenda Chroman, MD   Assessment & Plan: Visit Diagnoses:  1. Neck pain     Plan: Patient can continue intermittent ice or heat.  If he develops radicular symptoms he can return.  We discussed modification when he uses computer to make sure the screen is at a high level height to avoid excessive rotation neck flexion or extension.  If he gets increased symptoms he can return.  Follow-Up Instructions: No follow-ups on file.   Orders:  Orders Placed This Encounter  Procedures  . XR Cervical Spine 2 or 3 views   No orders of the defined types were placed in this encounter.     Procedures: No procedures performed   Clinical Data: No additional findings.   Subjective: Chief Complaint  Patient presents with  . Neck - Pain    HPI 68 year old male seen with 6-week history of neck pain left shoulder pain that bothers him worse at the end of the day.  Increased discomfort if he is using a computer with his neck in a flexed position.  No problems turning to the right.  He is used ice massage and Tylenol.  He is on Xarelto for atrial fib and is not able to take anti-inflammatories over-the-counter.  He is seeing a chiropractor regularly for the last several years and onset of symptoms was a couple days after his visit.  Subsequent visits did not seem to change to symptoms.  He denies any gait disturbance no fever or chills.  Does have a history of gout and is on allopurinol 300 mg daily.  Review of Systems positive for history of lumbar H&P atrial fibrillation.  History of systolic CHF.,  Hypertension, gout.  Otherwise -14 point systems as a pertains HPI.   Objective: Vital Signs: BP 138/77   Pulse 65   Ht 6\' 2"  (1.88 m)   Wt 275 lb (124.7 kg)   BMI 35.31 kg/m    Physical Exam Constitutional:      Appearance: He is well-developed.  HENT:     Head: Normocephalic and atraumatic.  Eyes:     Pupils: Pupils are equal, round, and reactive to light.  Neck:     Thyroid: No thyromegaly.     Trachea: No tracheal deviation.  Cardiovascular:     Rate and Rhythm: Normal rate.  Pulmonary:     Effort: Pulmonary effort is normal.     Breath sounds: No wheezing.  Abdominal:     General: Bowel sounds are normal.     Palpations: Abdomen is soft.  Skin:    General: Skin is warm and dry.     Capillary Refill: Capillary refill takes less than 2 seconds.  Neurological:     Mental Status: He is alert and oriented to person, place, and time.  Psychiatric:        Behavior: Behavior normal.        Thought Content: Thought content normal.        Judgment: Judgment normal.     Ortho Exam patient has 2+ upper extremity reflexes biceps triceps brachial radialis no impingement of the shoulder no significant  brachial plexus tenderness on the left or right.  No increased pain with cervical compression no relief with distraction.  He does have some discomfort with positive Spurling on the left negative right.  Negative Lhermitte test.  Normal heel toe gait.  Specialty Comments:  No specialty comments available.  Imaging: No results found.   PMFS History: Patient Active Problem List   Diagnosis Date Noted  . Visit for monitoring Tikosyn therapy 02/28/2018  . CAD (coronary artery disease)   . Atrial fibrillation with RVR ([facility]) 12/11/2017  . Chronic systolic CHF (congestive heart failure) ([facility]) 12/11/2017  . HTN (hypertension) 12/11/2017  . Gout 12/11/2017  . BPH (benign prostatic hyperplasia) 12/11/2017  . 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 Numbness 10/31/2015  . Restless leg 10/31/2015  . Polyneuropathy 10/31/2015  . Anemia, iron deficiency 10/31/2015  . HNP (herniated nucleus pulposus), lumbar 01/20/2013    Class: Diagnosis of  . Abnormal  echocardiogram 02/23/2012   Past Medical History:  Diagnosis Date  . Arthritis    "knuckles maybe" (02/28/2018)  . Atrial fib/flutter, transient    a. s/p TEE-guided DCCV on 12/14/2017 with return to NSR.   [name] BPH (benign prostatic hyperplasia) 12/11/2017  . Essential hypertension   . Gout    "I take RX qd" (02/28/2018)  . Neuropathy   . Pneumonia 11/2017  . Thyroid nodule    Benign    Family History  Problem Relation Age of Onset  . Lung cancer Father   . Cancer - Lung Father   . Atrial fibrillation Mother   . Dementia Mother   . Transient ischemic attack Mother   . Hypertension Other   . Liver cancer Brother     Past Surgical History:  Procedure Laterality Date  . BACK SURGERY    . BIOPSY THYROID    . CARDIOVERSION N/A 12/14/2017   Procedure: CARDIOVERSION;  Surgeon: Satira Sark, MD;  Location: [facility];  Service: Cardiovascular;  Laterality: N/A;  . CATARACT EXTRACTION W/ INTRAOCULAR LENS  IMPLANT, BILATERAL Bilateral 2006-2010   "right-left"  . COLONOSCOPY  2012  . IRRIGATION AND DEBRIDEMENT SEBACEOUS CYST    . LUMBAR LAMINECTOMY N/A 01/20/2013   Procedure: MICRODISCECTOMY LUMBAR LAMINECTOMY;  Surgeon: Marybelle Killings, MD;  Location: [facility];  Service: Orthopedics;  Laterality: N/A;  L4-5 Decompression  . PILONIDAL CYST DRAINAGE    . TEE WITHOUT CARDIOVERSION N/A 12/14/2017   Procedure: TRANSESOPHAGEAL ECHOCARDIOGRAM (TEE) WITH PROPOFOL;  Surgeon: Satira Sark, MD;  Location: [facility];  Service: Cardiovascular;  Laterality: N/A;   Social History   Occupational History  . Not on file  Tobacco Use  . Smoking status: Never Smoker  . Smokeless tobacco: Never Used  Substance and Sexual Activity  . Alcohol use: Never    Alcohol/week: 0.0 standard drinks    Frequency: Never  . Drug use: Never  . Sexual activity: Not Currently

## 2018-11-22 DIAGNOSIS — I10 Essential (primary) hypertension: Secondary | ICD-10-CM | POA: Diagnosis not present

## 2018-12-06 DIAGNOSIS — I10 Essential (primary) hypertension: Secondary | ICD-10-CM | POA: Diagnosis not present

## 2018-12-22 DIAGNOSIS — Z6835 Body mass index (BMI) 35.0-35.9, adult: Secondary | ICD-10-CM | POA: Diagnosis not present

## 2018-12-22 DIAGNOSIS — Z299 Encounter for prophylactic measures, unspecified: Secondary | ICD-10-CM | POA: Diagnosis not present

## 2018-12-22 DIAGNOSIS — I4891 Unspecified atrial fibrillation: Secondary | ICD-10-CM | POA: Diagnosis not present

## 2018-12-22 DIAGNOSIS — I429 Cardiomyopathy, unspecified: Secondary | ICD-10-CM | POA: Diagnosis not present

## 2018-12-22 DIAGNOSIS — I10 Essential (primary) hypertension: Secondary | ICD-10-CM | POA: Diagnosis not present

## 2018-12-22 DIAGNOSIS — E7800 Pure hypercholesterolemia, unspecified: Secondary | ICD-10-CM | POA: Diagnosis not present

## 2018-12-27 ENCOUNTER — Other Ambulatory Visit: Payer: Self-pay | Admitting: Cardiovascular Disease

## 2018-12-27 ENCOUNTER — Other Ambulatory Visit: Payer: Self-pay | Admitting: Student

## 2019-01-13 ENCOUNTER — Encounter (HOSPITAL_COMMUNITY): Payer: Self-pay

## 2019-01-13 ENCOUNTER — Telehealth: Payer: Self-pay | Admitting: *Deleted

## 2019-01-13 ENCOUNTER — Observation Stay (HOSPITAL_COMMUNITY)
Admission: EM | Admit: 2019-01-13 | Discharge: 2019-01-14 | Disposition: A | Discharge status: Home / Self Care | Payer: Medicare Other | Attending: Cardiovascular Disease | Admitting: Cardiovascular Disease

## 2019-01-13 ENCOUNTER — Other Ambulatory Visit: Payer: Self-pay

## 2019-01-13 ENCOUNTER — Emergency Department (HOSPITAL_COMMUNITY): Payer: Medicare Other

## 2019-01-13 DIAGNOSIS — N400 Enlarged prostate without lower urinary tract symptoms: Secondary | ICD-10-CM | POA: Insufficient documentation

## 2019-01-13 DIAGNOSIS — I5022 Chronic systolic (congestive) heart failure: Secondary | ICD-10-CM | POA: Insufficient documentation

## 2019-01-13 DIAGNOSIS — M109 Gout, unspecified: Secondary | ICD-10-CM | POA: Diagnosis not present

## 2019-01-13 DIAGNOSIS — I4819 Other persistent atrial fibrillation: Secondary | ICD-10-CM | POA: Diagnosis not present

## 2019-01-13 DIAGNOSIS — Z20828 Contact with and (suspected) exposure to other viral communicable diseases: Secondary | ICD-10-CM | POA: Diagnosis not present

## 2019-01-13 DIAGNOSIS — D696 Thrombocytopenia, unspecified: Secondary | ICD-10-CM | POA: Insufficient documentation

## 2019-01-13 DIAGNOSIS — I110 Hypertensive heart disease with heart failure: Secondary | ICD-10-CM | POA: Insufficient documentation

## 2019-01-13 DIAGNOSIS — I4891 Unspecified atrial fibrillation: Secondary | ICD-10-CM | POA: Diagnosis not present

## 2019-01-13 DIAGNOSIS — D509 Iron deficiency anemia, unspecified: Secondary | ICD-10-CM | POA: Insufficient documentation

## 2019-01-13 DIAGNOSIS — Z79899 Other long term (current) drug therapy: Secondary | ICD-10-CM | POA: Diagnosis not present

## 2019-01-13 DIAGNOSIS — Z03818 Encounter for observation for suspected exposure to other biological agents ruled out: Secondary | ICD-10-CM | POA: Diagnosis not present

## 2019-01-13 DIAGNOSIS — R0602 Shortness of breath: Secondary | ICD-10-CM | POA: Diagnosis not present

## 2019-01-13 DIAGNOSIS — I2510 Atherosclerotic heart disease of native coronary artery without angina pectoris: Secondary | ICD-10-CM | POA: Insufficient documentation

## 2019-01-13 DIAGNOSIS — R002 Palpitations: Secondary | ICD-10-CM | POA: Diagnosis not present

## 2019-01-13 LAB — CBC WITH DIFFERENTIAL/PLATELET
Abs Immature Granulocytes: 0.02 10*3/uL (ref 0.00–0.07)
Basophils Absolute: 0 10*3/uL (ref 0.0–0.1)
Basophils Relative: 0 %
Eosinophils Absolute: 0.1 10*3/uL (ref 0.0–0.5)
Eosinophils Relative: 1 %
HCT: 45.2 % (ref 39.0–52.0)
Hemoglobin: 14.5 g/dL (ref 13.0–17.0)
Immature Granulocytes: 0 %
Lymphocytes Relative: 15 %
Lymphs Abs: 1 10*3/uL (ref 0.7–4.0)
MCH: 29.4 pg (ref 26.0–34.0)
MCHC: 32.1 g/dL (ref 30.0–36.0)
MCV: 91.5 fL (ref 80.0–100.0)
Monocytes Absolute: 0.4 10*3/uL (ref 0.1–1.0)
Monocytes Relative: 6 %
Neutro Abs: 5.1 10*3/uL (ref 1.7–7.7)
Neutrophils Relative %: 78 %
Platelets: 140 10*3/uL — ABNORMAL LOW (ref 150–400)
RBC: 4.94 MIL/uL (ref 4.22–5.81)
RDW: 12.6 % (ref 11.5–15.5)
WBC: 6.6 10*3/uL (ref 4.0–10.5)
nRBC: 0 % (ref 0.0–0.2)

## 2019-01-13 LAB — BASIC METABOLIC PANEL
Anion gap: 7 (ref 5–15)
BUN: 16 mg/dL (ref 8–23)
CO2: 25 mmol/L (ref 22–32)
Calcium: 8.9 mg/dL (ref 8.9–10.3)
Chloride: 108 mmol/L (ref 98–111)
Creatinine, Ser: 0.87 mg/dL (ref 0.61–1.24)
GFR calc Af Amer: >60 mL/min (ref >60)
GFR calc non Af Amer: >60 mL/min (ref >60)
Glucose, Bld: 110 mg/dL — ABNORMAL HIGH (ref 70–99)
Potassium: 4 mmol/L (ref 3.5–5.1)
Sodium: 140 mmol/L (ref 135–145)

## 2019-01-13 LAB — SARS CORONAVIRUS 2 (TAT 6-24 HRS): SARS Coronavirus 2: NEGATIVE

## 2019-01-13 LAB — TROPONIN I (HIGH SENSITIVITY)
Troponin I (High Sensitivity): 3 ng/L (ref <18)
Troponin I (High Sensitivity): 3 ng/L (ref <18)

## 2019-01-13 LAB — MAGNESIUM: Magnesium: 2.3 mg/dL (ref 1.7–2.4)

## 2019-01-13 NOTE — ED Notes (Signed)
ED TO INPATIENT HANDOFF REPORT  ED Nurse Name and Phone #: Osie Cheeks Name/Age/Gender [name] 68 y.o. male Room/Bed: APA06/APA06  Code Status   Code Status: Prior  Home/SNF/Other Home Patient oriented to: self Is this baseline? Yes   Triage Complete: Triage complete  Chief Complaint heart palpitation  Triage Note Pt reports has felt heart racing since 6am.  This morning.    Denies any chest pain or sob.    Allergies Allergies  Allergen Reactions  . Azithromycin     QT WAVE    Level of Care/Admitting Diagnosis ED Disposition    ED Disposition Condition Comment   Admit  Hospital Area: [facility] [[id number]]  Level of Care: Telemetry Cardiac [103]  Covid Evaluation: Asymptomatic Screening Protocol (No Symptoms)  Diagnosis: Atrial fibrillation with rapid ventricular response [facility]) [id number]  Admitting Physician: Donato Heinz [id number]  Attending Physician: Donato Heinz [id number]  PT Class (Do Not Modify): Observation [104]  PT Acc Code (Do Not Modify): Observation [10022]       B Medical/Surgery History Past Medical History:  Diagnosis Date  . Arthritis    "knuckles maybe" (02/28/2018)  . Atrial fib/flutter, transient    a. s/p TEE-guided DCCV on 12/14/2017 with return to NSR.   [name] BPH (benign prostatic hyperplasia) 12/11/2017  . Essential hypertension   . Gout    "I take RX qd" (02/28/2018)  . Neuropathy   . Pneumonia 11/2017  . Thyroid nodule    Benign   Past Surgical History:  Procedure Laterality Date  . BACK SURGERY    . BIOPSY THYROID    . CARDIOVERSION N/A 12/14/2017   Procedure: CARDIOVERSION;  Surgeon: Satira Sark, MD;  Location[facility];  Service: Cardiovascular;  Laterality: N/A;  . CATARACT EXTRACTION W/ INTRAOCULAR LENS  IMPLANT, BILATERAL Bilateral 2006-2010   "right-left"  . COLONOSCOPY  2012  . IRRIGATION AND DEBRIDEMENT SEBACEOUS CYST    . LUMBAR LAMINECTOMY N/A 01/20/2013   Procedure:  MICRODISCECTOMY LUMBAR LAMINECTOMY;  Surgeon: Marybelle Killings, MD;  Location: [facility];  Service: Orthopedics;  Laterality: N/A;  L4-5 Decompression  . PILONIDAL CYST DRAINAGE    . TEE WITHOUT CARDIOVERSION N/A 12/14/2017   Procedure: TRANSESOPHAGEAL ECHOCARDIOGRAM (TEE) WITH PROPOFOL;  Surgeon: Satira Sark, MD;  Location[facility];  Service: Cardiovascular;  Laterality: N/A;     A IV Location/Drains/Wounds Patient Lines/Drains/Airways Status   Active Line/Drains/Airways    Name:   Placement date:   Placement time:   Site:   Days:   Peripheral IV 01/13/19 Left;Lateral Antecubital   01/13/19    1551    Antecubital   less than 1          Intake/Output Last 24 hours No intake or output data in the 24 hours ending 01/13/19 1859  Labs/Imaging Results for orders placed or performed during the hospital encounter of 01/13/19 (from the past 48 hour(s))  Magnesium     Status: None   Collection Time: 01/13/19  4:05 PM  Result Value Ref Range   Magnesium 2.3 1.7 - 2.4 mg/dL    Comment: Performed at [facility], [address], [place], [place] [id number]  Basic metabolic panel     Status: Abnormal   Collection Time: 01/13/19  4:05 PM  Result Value Ref Range   Sodium 140 135 - 145 mmol/L   Potassium 4.0 3.5 - 5.1 mmol/L   Chloride 108 98 - 111 mmol/L   CO2 25 22 -  32 mmol/L   Glucose, Bld 110 (H) 70 - 99 mg/dL   BUN 16 8 - 23 mg/dL   Creatinine, Ser 0.87 0.61 - 1.24 mg/dL   Calcium 8.9 8.9 - 10.3 mg/dL   GFR calc non Af Amer >60 >60 mL/min   GFR calc Af Amer >60 >60 mL/min   Anion gap 7 5 - 15    Comment: Performed at [facility], [address] [place], [place] [address]  Troponin I (High Sensitivity)     Status: None   Collection Time: 01/13/19  4:05 PM  Result Value Ref Range   Troponin I (High Sensitivity) 3 <18 ng/L    Comment: (NOTE) Elevated high sensitivity troponin I (hsTnI) values and significant  changes across serial measurements may suggest ACS but many other  chronic  and acute conditions are known to elevate hsTnI results.  Refer to the "Links" section for chest pain algorithms and additional  guidance. Performed at [facility], [address] [place], [place] [address]   CBC with Differential     Status: Abnormal   Collection Time: 01/13/19  4:05 PM  Result Value Ref Range   WBC 6.6 4.0 - 10.5 K/uL   RBC 4.94 4.22 - 5.81 MIL/uL   Hemoglobin 14.5 13.0 - 17.0 g/dL   HCT 45.2 39.0 - 52.0 %   MCV 91.5 80.0 - 100.0 fL   MCH 29.4 26.0 - 34.0 pg   MCHC 32.1 30.0 - 36.0 g/dL   RDW 12.6 11.5 - 15.5 %   Platelets 140 (L) 150 - 400 K/uL   nRBC 0.0 0.0 - 0.2 %   Neutrophils Relative % 78 %   Neutro Abs 5.1 1.7 - 7.7 K/uL   Lymphocytes Relative 15 %   Lymphs Abs 1.0 0.7 - 4.0 K/uL   Monocytes Relative 6 %   Monocytes Absolute 0.4 0.1 - 1.0 K/uL   Eosinophils Relative 1 %   Eosinophils Absolute 0.1 0.0 - 0.5 K/uL   Basophils Relative 0 %   Basophils Absolute 0.0 0.0 - 0.1 K/uL   Immature Granulocytes 0 %   Abs Immature Granulocytes 0.02 0.00 - 0.07 K/uL    Comment: Performed at [facility], [address] [place], [place] [address]  Troponin I (High Sensitivity)     Status: None   Collection Time: 01/13/19  6:05 PM  Result Value Ref Range   Troponin I (High Sensitivity) 3 <18 ng/L    Comment: (NOTE) Elevated high sensitivity troponin I (hsTnI) values and significant  changes across serial measurements may suggest ACS but many other  chronic and acute conditions are known to elevate hsTnI results.  Refer to the "Links" section for chest pain algorithms and additional  guidance. Performed at [facility], [address], [place], [place] [address]    Dg Chest Port 1 View  Result Date: 01/13/2019 CLINICAL DATA:  Palpitations and sob. Hx of afib EXAM: PORTABLE CHEST 1 VIEW COMPARISON:  07/10/2018 FINDINGS: Heart is enlarged and stable in configuration. There is mild pulmonary vascular congestion but no overt edema. No focal consolidations or pleural  effusions. IMPRESSION: Stable cardiomegaly. Electronically Signed   By: Nolon Nations M.D.   On: 01/13/2019 16:22    Pending Labs Unresulted Labs (From admission, onward)    Start     Ordered   01/13/19 1739  SARS CORONAVIRUS 2 Nasal Swab Aptima Multi Swab  (Asymptomatic/Tier 2 Patients Labs)  Once,   STAT    Question Answer Comment  Is this test for diagnosis or screening Screening   Symptomatic for COVID-19 as defined by CDC No   Hospitalized for COVID-19 No   Admitted to ICU for COVID-19 No   Previously tested for COVID-19 No   Resident in a congregate (group) care setting No   Employed in healthcare setting No      01/13/19 1738          Vitals/Pain Today's Vitals   01/13/19 1700 01/13/19 1730 01/13/19 1745 01/13/19 1830  BP: (!) 136/93 135/81  120/77  Pulse:   95   Resp: 17 17 12  (!) 25  Temp:      TempSrc:      SpO2:   98%   Weight:      Height:      PainSc:        Isolation Precautions No active isolations  Medications Medications - No data to display  Mobility walks Low fall risk   Focused Assessments Cardiac Assessment Handoff:  Cardiac Rhythm: Atrial fibrillation Lab Results  Component Value Date   TROPONINI <0.03 07/10/2018   No results found for: DDIMER Does the Patient currently have chest pain? No     R Recommendations: See Admitting Provider Note  Report given to:   Additional Notes: Pt in afib

## 2019-01-13 NOTE — ED Notes (Signed)
Patient's pulse range 120-145 while walking.  Patient's o2 sat 95-98%

## 2019-01-13 NOTE — Telephone Encounter (Signed)
He has persistent atrial fibrillation and takes dofetilide and follows with Dr. Lovena Le, most recently in May.  He needs an ECG.  If he is feeling fatigued and lightheaded and has palpitations and thinks his heart is racing he may need to go to the ED.

## 2019-01-13 NOTE — Telephone Encounter (Signed)
Wife informed and agrees to take patient to the ED for an evaluation.

## 2019-01-13 NOTE — ED Triage Notes (Signed)
Pt reports has felt heart racing since 6am.  This morning.    Denies any chest pain or sob.

## 2019-01-13 NOTE — ED Provider Notes (Signed)
[facility] EMERGENCY DEPARTMENT Provider Note   CSN: [id number] Arrival date & time: 01/13/19  1440     History   Chief Complaint Chief Complaint  Patient presents with   Palpitations    HPI [name] is a 68 y.o. male.     HPI  Pt was seen at 1605. Per pt, c/o sudden onset and persistence of constant "palpitations" that he noticed at 6am this morning. Pt describes the palpitations as "feeling my heart beat" and "irregular." Pt states when he ambulated the palpitations would get worse and cause him to feel "lightheaded." Pt's wife took pt's HR and it was "70 and irregular." Pt endorses compliance with his xarelto (LD last night) and tikosyn (LD this morning). Denies CP, no SOB, no cough, no fevers, no rash, no abd pain, no N/V/D, no focal motor weakness, no tingling/numbness in extremities.     Past Medical History:  Diagnosis Date   Arthritis    "knuckles maybe" (02/28/2018)   Atrial fib/flutter, transient    a. s/p TEE-guided DCCV on 12/14/2017 with return to [facility].    BPH (benign prostatic hyperplasia) 12/11/2017   Essential hypertension    Gout    "I take RX qd" (02/28/2018)   Neuropathy    Pneumonia 11/2017   Thyroid nodule    Benign    Patient Active Problem List   Diagnosis Date Noted   Visit for monitoring Tikosyn therapy 02/28/2018   CAD (coronary artery disease)    Atrial fibrillation with RVR ([place]) [id number]   Chronic systolic CHF (congestive heart failure) ([place]) 12/11/2017   HTN (hypertension) 12/11/2017   Gout 12/11/2017   BPH (benign prostatic hyperplasia) 12/11/2017   Periodic limb movement 10/31/2015   Numbness 10/31/2015   Restless leg 10/31/2015   Polyneuropathy 10/31/2015   Anemia, iron deficiency 10/31/2015   HNP (herniated nucleus pulposus), lumbar 01/20/2013    Class: Diagnosis of   Abnormal echocardiogram 02/23/2012    Past Surgical History:  Procedure Laterality Date   BACK SURGERY     BIOPSY THYROID      CARDIOVERSION N/A 12/14/2017   Procedure: CARDIOVERSION;  Surgeon: Satira Sark, MD;  Location: [facility];  Service: Cardiovascular;  Laterality: N/A;   CATARACT EXTRACTION W/ INTRAOCULAR LENS  IMPLANT, BILATERAL Bilateral 2006-2010   "right-left"   COLONOSCOPY  2012   IRRIGATION AND DEBRIDEMENT SEBACEOUS CYST     LUMBAR LAMINECTOMY N/A 01/20/2013   Procedure: MICRODISCECTOMY LUMBAR LAMINECTOMY;  Surgeon: Marybelle Killings, MD;  Location: [place];  Service: Orthopedics;  Laterality: N/A;  L4-5 Decompression   PILONIDAL CYST DRAINAGE     TEE WITHOUT CARDIOVERSION N/A 12/14/2017   Procedure: TRANSESOPHAGEAL ECHOCARDIOGRAM (TEE) WITH PROPOFOL;  Surgeon: Satira Sark, MD;  Location: [facility];  Service: Cardiovascular;  Laterality: N/A;        Home Medications    Prior to Admission medications   Medication Sig Start Date End Date Taking? Authorizing Provider  acetaminophen (TYLENOL) 500 MG tablet Take 500-1,000 mg by mouth every 6 (six) hours as needed (for pain.).    [provider]  allopurinol (ZYLOPRIM) 300 MG tablet Take 300 mg by mouth daily as needed.     [provider]  amLODipine (NORVASC) 5 MG tablet Take 1 tablet (5 mg total) by mouth daily. (LUNCH TIME) 07/21/18 01/13/19  Herminio Commons, MD  Calcium Carb-Cholecalciferol (CALCIUM + D3 PO) Take 1 tablet by mouth daily at 3 pm.     [provider]  carvedilol (  COREG) 12.5 MG tablet Take 18.75 mg by mouth 2 (two) times daily with a meal.    [provider]  cetirizine (ZYRTEC) 10 MG tablet Take 10 mg by mouth daily as needed for allergies.    [provider]  Cholecalciferol (VITAMIN D3) 2000 UNITS TABS Take 2,000 Units by mouth daily at 3 pm.     [provider]  CINNAMON PO Take 350 mg by mouth daily at 3 pm.     [provider]  Coenzyme Q10 200 MG capsule Take 200 mg by mouth daily at 3 pm.     [provider]  dofetilide (TIKOSYN) 250 MCG capsule  TAKE ONE CAPSULE BY MOUTH TWICE DAILY. 08/31/18   Herminio Commons, MD  Ferrous Fumarate (IRON) 18 MG TBCR Take 18 mg by mouth daily at 3 pm.     [provider]  fish oil-omega-3 fatty acids 1000 MG capsule Take 3,000 mg by mouth daily.     [provider]  Glucosamine-Chondroitin (GLUCOSAMINE CHONDR COMPLEX PO) Take 1 tablet by mouth 3 (three) times a week. 1500mg /1200mg     [provider]  LUTEIN-ZEAXANTHIN PO Take 1 tablet by mouth daily at 3 pm.     [provider]  Multiple Vitamins-Minerals (MULTIVITAMIN PO) Take 1 tablet by mouth daily at 3 pm.     [provider]  olmesartan (BENICAR) 40 MG tablet TAKE ONE TABLET BY MOUTH DAILY 12/27/18   Herminio Commons, MD  PHOSPHATIDYLCHOLINE PO Take 100 mg by mouth daily at 3 pm.     [provider]  potassium chloride (K-DUR,KLOR-CON) 10 MEQ tablet TAKE ONE TABLET BY MOUTH TWICE DAILY. Patient taking differently: Take 20 mEq by mouth daily.  09/07/18   Sherran Needs, NP  Probiotic Product (PROBIOTIC DAILY PO) Take 2 tablets by mouth daily at 3 pm. 5 billion unit daily    [provider]  RESVERATROL 100 MG CAPS Take 100 mg by mouth daily at 3 pm.     [provider]  tamsulosin (FLOMAX) 0.4 MG CAPS capsule Take 0.4 mg by mouth daily as needed.  08/06/15   [provider]  XARELTO 20 MG TABS tablet TAKE ONE TABLET BY MOUTH DAILY WITH LUNCH 12/27/18   Herminio Commons, MD    Family History Family History  Problem Relation Age of Onset   Lung cancer Father    Cancer - Lung Father    Atrial fibrillation Mother    Dementia Mother    Transient ischemic attack Mother    Hypertension Other    Liver cancer Brother     Social History Social History   Tobacco Use   Smoking status: Never Smoker   Smokeless tobacco: Never Used  Substance Use Topics   Alcohol use: Never    Alcohol/week: 0.0 standard drinks    Frequency: Never   Drug use: Never      Allergies   Azithromycin   Review of Systems Review of Systems ROS: Statement: All systems negative except as marked or noted in the HPI; Constitutional: Negative for fever and chills. ; ; Eyes: Negative for eye pain, redness and discharge. ; ; ENMT: Negative for ear pain, hoarseness, nasal congestion, sinus pressure and sore throat. ; ; Cardiovascular: +palpitations. Negative for chest pain, diaphoresis, dyspnea and peripheral edema. ; ; Respiratory: Negative for cough, wheezing and stridor. ; ; Gastrointestinal: Negative for nausea, vomiting, diarrhea, abdominal pain, blood in stool, hematemesis, jaundice and rectal bleeding. . ; ;  Genitourinary: Negative for dysuria, flank pain and hematuria. ; ; Musculoskeletal: Negative for back pain and neck pain. Negative for swelling and trauma.; ; Skin: Negative for pruritus, rash, abrasions, blisters, bruising and skin lesion.; ; Neuro: +lightheadedness. Negative for headache and neck stiffness. Negative for weakness, altered level of consciousness, altered mental status, extremity weakness, paresthesias, involuntary movement, seizure and syncope.       Physical Exam Updated Vital Signs BP (!) 128/53    Pulse (!) 111    Temp 97.7 F (36.5 C) (Oral)    Resp 17    Ht 6\' 2"  (1.88 m)    Wt 125.2 kg    SpO2 98%    BMI 35.44 kg/m   Patient Vitals for the past 24 hrs:  BP Temp Temp src Pulse Resp SpO2 Height Weight  01/13/19 1700 (!) 136/93 -- -- -- 17 -- -- --  01/13/19 1630 137/84 -- -- 92 (!) 23 97 % -- --  01/13/19 1600 129/65 -- -- 89 10 98 % -- --  01/13/19 1530 (!) 128/53 -- -- (!) 111 17 98 % -- --  01/13/19 1448 (!) 115/93 97.7 F (36.5 C) Oral (!) 115 20 100 % 6\' 2"  (1.88 m) 125.2 kg    16:44 Orthostatic Vital Signs VP  Orthostatic Lying   BP- Lying: 130/85  Pulse- Lying: 114      Orthostatic Sitting  BP- Sitting: 137/84  Pulse- Sitting: 106      Orthostatic Standing at 0 minutes  BP- Standing at 0 minutes: 117/77  Pulse-  Standing at 0 minutes: 140     Physical Exam 1610: Physical examination:  Nursing notes reviewed; Vital signs and O2 SAT reviewed;  Constitutional: Well developed, Well nourished, Well hydrated, In no acute distress; Head:  Normocephalic, atraumatic; Eyes: EOMI, PERRL, No scleral icterus; ENMT: Mouth and pharynx normal, Mucous membranes moist; Neck: Supple, Full range of motion, No lymphadenopathy; Cardiovascular: Irregular rate and rhythm, No gallop; Respiratory: Breath sounds clear & equal bilaterally, No wheezes.  Speaking full sentences with ease, Normal respiratory effort/excursion; Chest: Nontender, Movement normal; Abdomen: Soft, Nontender, Nondistended, Normal bowel sounds; Genitourinary: No CVA tenderness; Extremities: Peripheral pulses normal, No tenderness, No edema, No calf edema or asymmetry.; Neuro: AA&Ox3, Major CN grossly intact.  Speech clear. No gross focal motor or sensory deficits in extremities.; Skin: Color normal, Warm, Dry.    ED Treatments / Results  Labs (all labs ordered are listed, but only abnormal results are displayed)   EKG EKG Interpretation  Date/Time:  Friday January 13 2019 14:50:42 EDT  #1 Ventricular Rate:  104 PR Interval:    QRS Duration: 96 QT Interval:  344 QTC Calculation: 452 R Axis:   -8 Text Interpretation:  Atrial fibrillation with rapid ventricular response Moderate voltage criteria for LVH, may be normal variant When compared with ECG of 07/10/2018 Rate slower Confirmed by Francine Graven [phone]) on 01/13/2019 4:12:26 PM    EKG Interpretation  Date/Time:  Friday January 13 2019 16:38:41 EDT  #2 Ventricular Rate:  98 PR Interval:    QRS Duration: 105 QT Interval:  353 QTC Calculation: 451 R Axis:   -8 Text Interpretation:  Atrial fibrillation LVH with secondary repolarization abnormality Baseline wander Since last tracing of earlier today No significant change was found Confirmed by Francine Graven [id number]) on 01/13/2019 4:58:54 PM          Radiology     Procedures Procedures (including critical care time)  Medications Ordered in ED Medications - No  data to display   Initial Impression / Assessment and Plan / ED Course  I have reviewed the triage vital signs and the nursing notes.  Pertinent labs & imaging results that were available during my care of the patient were reviewed by me and considered in my medical decision making (see chart for details).     MDM Reviewed: previous chart, nursing note and vitals Reviewed previous: labs and ECG Interpretation: labs, ECG and x-ray    Results for orders placed or performed during the hospital encounter of 01/13/19  Magnesium  Result Value Ref Range   Magnesium 2.3 1.7 - 2.4 mg/dL  Basic metabolic panel  Result Value Ref Range   Sodium 140 135 - 145 mmol/L   Potassium 4.0 3.5 - 5.1 mmol/L   Chloride 108 98 - 111 mmol/L   CO2 25 22 - 32 mmol/L   Glucose, Bld 110 (H) 70 - 99 mg/dL   BUN 16 8 - 23 mg/dL   Creatinine, Ser 0.87 0.61 - 1.24 mg/dL   Calcium 8.9 8.9 - 10.3 mg/dL   GFR calc non Af Amer >60 >60 mL/min   GFR calc Af Amer >60 >60 mL/min   Anion gap 7 5 - 15  CBC with Differential  Result Value Ref Range   WBC 6.6 4.0 - 10.5 K/uL   RBC 4.94 4.22 - 5.81 MIL/uL   Hemoglobin 14.5 13.0 - 17.0 g/dL   HCT 45.2 39.0 - 52.0 %   MCV 91.5 80.0 - 100.0 fL   MCH 29.4 26.0 - 34.0 pg   MCHC 32.1 30.0 - 36.0 g/dL   RDW 12.6 11.5 - 15.5 %   Platelets 140 (L) 150 - 400 K/uL   nRBC 0.0 0.0 - 0.2 %   Neutrophils Relative % 78 %   Neutro Abs 5.1 1.7 - 7.7 K/uL   Lymphocytes Relative 15 %   Lymphs Abs 1.0 0.7 - 4.0 K/uL   Monocytes Relative 6 %   Monocytes Absolute 0.4 0.1 - 1.0 K/uL   Eosinophils Relative 1 %   Eosinophils Absolute 0.1 0.0 - 0.5 K/uL   Basophils Relative 0 %   Basophils Absolute 0.0 0.0 - 0.1 K/uL   Immature Granulocytes 0 %   Abs Immature Granulocytes 0.02 0.00 - 0.07 K/uL  Troponin I (High Sensitivity)  Result Value Ref Range    Troponin I (High Sensitivity) 3 <18 ng/L   Dg Chest Port 1 View Result Date: 01/13/2019 CLINICAL DATA:  Palpitations and sob. Hx of afib EXAM: PORTABLE CHEST 1 VIEW COMPARISON:  07/10/2018 FINDINGS: Heart is enlarged and stable in configuration. There is mild pulmonary vascular congestion but no overt edema. No focal consolidations or pleural effusions. IMPRESSION: Stable cardiomegaly. Electronically Signed   By: Nolon Nations M.D.   On: 01/13/2019 16:22    [name] was evaluated in Emergency Department on 01/13/2019 for the symptoms described in the history of present illness. He was evaluated in the context of the global COVID-19 pandemic, which necessitated consideration that the patient might be at risk for infection with the SARS-CoV-2 virus that causes COVID-19. Institutional protocols and algorithms that pertain to the evaluation of patients at risk for COVID-19 are in a state of rapid change based on information released by regulatory bodies including the CDC and federal and state organizations. These policies and algorithms were followed during the patient's care in the ED.    1725:  Pt's HR at rest on stretcher 80-90's, afib. HR increases during  orthostatic VS as well as ambulating (HR increases to 140's, afib), Sats remained 95-98% R/A. Initial troponin negative. EKG without acute STTW changes. Denies CP/SOB. T/C returned from [facility] Cards Dr. Gardiner Rhyme, case discussed, including:  HPI, pertinent PM/SHx, VS/PE, dx testing, ED course and treatment:  Agreeable to admit/transfer to [facility], no meds for now, requests to write temporary orders, obtain tele bed to Cards service. Dx and testing, as well as d/w Cards MD, d/w pt and family.  Questions answered.  Verb understanding, agreeable to admit.      Final Clinical Impressions(s) / ED Diagnoses   Final diagnoses:  None    ED Discharge Orders    None       Francine Graven, DO 01/15/19 1544

## 2019-01-13 NOTE — Telephone Encounter (Signed)
Reports fatigue,pulse feels erratic and is unable to get accurate pulse readings due to being irregular, lightheaded, feels heart rate beating fast with palpitations. HR 76, BP 140/80 & O2 Sat 98 %. Denies sob or chest pain. Medications reviewed.

## 2019-01-14 ENCOUNTER — Encounter (HOSPITAL_COMMUNITY): Payer: Self-pay | Admitting: Anesthesiology

## 2019-01-14 ENCOUNTER — Inpatient Hospital Stay (HOSPITAL_COMMUNITY): Payer: Medicare Other | Admitting: Anesthesiology

## 2019-01-14 ENCOUNTER — Encounter (HOSPITAL_COMMUNITY)
Admission: EM | Disposition: A | Discharge status: Home / Self Care | Payer: Self-pay | Source: Home / Self Care | Attending: Emergency Medicine

## 2019-01-14 ENCOUNTER — Other Ambulatory Visit (HOSPITAL_COMMUNITY): Payer: Medicare Other

## 2019-01-14 DIAGNOSIS — D509 Iron deficiency anemia, unspecified: Secondary | ICD-10-CM | POA: Diagnosis not present

## 2019-01-14 DIAGNOSIS — Z79899 Other long term (current) drug therapy: Secondary | ICD-10-CM | POA: Diagnosis not present

## 2019-01-14 DIAGNOSIS — D696 Thrombocytopenia, unspecified: Secondary | ICD-10-CM | POA: Diagnosis not present

## 2019-01-14 DIAGNOSIS — I4819 Other persistent atrial fibrillation: Secondary | ICD-10-CM | POA: Diagnosis not present

## 2019-01-14 DIAGNOSIS — I10 Essential (primary) hypertension: Secondary | ICD-10-CM | POA: Diagnosis not present

## 2019-01-14 DIAGNOSIS — N400 Enlarged prostate without lower urinary tract symptoms: Secondary | ICD-10-CM | POA: Diagnosis not present

## 2019-01-14 DIAGNOSIS — I2510 Atherosclerotic heart disease of native coronary artery without angina pectoris: Secondary | ICD-10-CM | POA: Diagnosis not present

## 2019-01-14 DIAGNOSIS — I110 Hypertensive heart disease with heart failure: Secondary | ICD-10-CM | POA: Diagnosis not present

## 2019-01-14 DIAGNOSIS — I5022 Chronic systolic (congestive) heart failure: Secondary | ICD-10-CM | POA: Diagnosis not present

## 2019-01-14 DIAGNOSIS — Z20828 Contact with and (suspected) exposure to other viral communicable diseases: Secondary | ICD-10-CM | POA: Diagnosis not present

## 2019-01-14 DIAGNOSIS — M109 Gout, unspecified: Secondary | ICD-10-CM | POA: Diagnosis not present

## 2019-01-14 DIAGNOSIS — I4891 Unspecified atrial fibrillation: Secondary | ICD-10-CM | POA: Diagnosis present

## 2019-01-14 HISTORY — PX: CARDIOVERSION: SHX1299

## 2019-01-14 LAB — COMPREHENSIVE METABOLIC PANEL
ALT: 19 U/L (ref 0–44)
AST: 19 U/L (ref 15–41)
Albumin: 3.6 g/dL (ref 3.5–5.0)
Alkaline Phosphatase: 54 U/L (ref 38–126)
Anion gap: 9 (ref 5–15)
BUN: 19 mg/dL (ref 8–23)
CO2: 24 mmol/L (ref 22–32)
Calcium: 8.7 mg/dL — ABNORMAL LOW (ref 8.9–10.3)
Chloride: 108 mmol/L (ref 98–111)
Creatinine, Ser: 1.05 mg/dL (ref 0.61–1.24)
GFR calc Af Amer: >60 mL/min (ref >60)
GFR calc non Af Amer: >60 mL/min (ref >60)
Glucose, Bld: 110 mg/dL — ABNORMAL HIGH (ref 70–99)
Potassium: 3.8 mmol/L (ref 3.5–5.1)
Sodium: 141 mmol/L (ref 135–145)
Total Bilirubin: 1.6 mg/dL — ABNORMAL HIGH (ref 0.3–1.2)
Total Protein: 6.8 g/dL (ref 6.5–8.1)

## 2019-01-14 LAB — MAGNESIUM: Magnesium: 2.1 mg/dL (ref 1.7–2.4)

## 2019-01-14 LAB — HIV ANTIBODY (ROUTINE TESTING W REFLEX): HIV Screen 4th Generation wRfx: NONREACTIVE

## 2019-01-14 LAB — TSH: TSH: 4.233 u[IU]/mL (ref 0.350–4.500)

## 2019-01-14 LAB — BRAIN NATRIURETIC PEPTIDE: B Natriuretic Peptide: 312.2 pg/mL — ABNORMAL HIGH (ref 0.0–100.0)

## 2019-01-14 SURGERY — CARDIOVERSION
Anesthesia: General

## 2019-01-14 MED ORDER — DOFETILIDE 250 MCG PO CAPS
250.0000 ug | ORAL_CAPSULE | Freq: Two times a day (BID) | ORAL | Status: DC
  Administered 2019-01-14: 250 ug via ORAL
  Filled 2019-01-14 (×2): qty 1

## 2019-01-14 MED ORDER — AMLODIPINE BESYLATE 5 MG PO TABS
5.0000 mg | ORAL_TABLET | Freq: Every day | ORAL | Status: DC
  Administered 2019-01-14: 5 mg via ORAL
  Filled 2019-01-14: qty 1

## 2019-01-14 MED ORDER — RIVAROXABAN 20 MG PO TABS
20.0000 mg | ORAL_TABLET | Freq: Every day | ORAL | Status: DC
  Filled 2019-01-14 (×2): qty 1

## 2019-01-14 MED ORDER — PHENYLEPHRINE 40 MCG/ML (10ML) SYRINGE FOR IV PUSH (FOR BLOOD PRESSURE SUPPORT)
PREFILLED_SYRINGE | INTRAVENOUS | Status: DC | PRN
  Administered 2019-01-14 (×2): 80 ug via INTRAVENOUS

## 2019-01-14 MED ORDER — LIDOCAINE 2% (20 MG/ML) 5 ML SYRINGE
INTRAMUSCULAR | Status: DC | PRN
  Administered 2019-01-14: 60 mg via INTRAVENOUS

## 2019-01-14 MED ORDER — SODIUM CHLORIDE 0.9% FLUSH
3.0000 mL | Freq: Two times a day (BID) | INTRAVENOUS | Status: DC
  Administered 2019-01-14: 3 mL via INTRAVENOUS

## 2019-01-14 MED ORDER — OLMESARTAN MEDOXOMIL 20 MG PO TABS
40.0000 mg | ORAL_TABLET | Freq: Every day | ORAL | Status: DC
  Administered 2019-01-14: 40 mg via ORAL
  Filled 2019-01-14: qty 2

## 2019-01-14 MED ORDER — PROPOFOL 10 MG/ML IV BOLUS
INTRAVENOUS | Status: DC | PRN
  Administered 2019-01-14: 50 mg via INTRAVENOUS
  Administered 2019-01-14 (×2): 25 mg via INTRAVENOUS

## 2019-01-14 MED ORDER — CARVEDILOL 6.25 MG PO TABS
18.7500 mg | ORAL_TABLET | Freq: Two times a day (BID) | ORAL | Status: DC
  Administered 2019-01-14: 18.75 mg via ORAL
  Filled 2019-01-14: qty 1

## 2019-01-14 MED ORDER — ONDANSETRON HCL 4 MG/2ML IJ SOLN: 4.0000 mg | Freq: Four times a day (QID) | INTRAMUSCULAR | Status: DC | PRN

## 2019-01-14 MED ORDER — SODIUM CHLORIDE 0.9 % IV SOLN
250.0000 mL | INTRAVENOUS | Status: DC
  Administered 2019-01-14: 09:00:00 250 mL via INTRAVENOUS
  Administered 2019-01-14: 11:00:00 via INTRAVENOUS

## 2019-01-14 MED ORDER — TAMSULOSIN HCL 0.4 MG PO CAPS
0.4000 mg | ORAL_CAPSULE | Freq: Every day | ORAL | Status: DC
  Administered 2019-01-14: 0.4 mg via ORAL
  Filled 2019-01-14: qty 1

## 2019-01-14 MED ORDER — ACETAMINOPHEN 325 MG PO TABS: 650.0000 mg | ORAL_TABLET | ORAL | Status: DC | PRN

## 2019-01-14 MED ORDER — SODIUM CHLORIDE 0.9% FLUSH: 3.0000 mL | INTRAVENOUS | Status: DC | PRN

## 2019-01-14 MED ORDER — FERROUS FUMARATE 324 (106 FE) MG PO TABS
1.0000 | ORAL_TABLET | Freq: Every day | ORAL | Status: DC
  Administered 2019-01-14: 106 mg via ORAL
  Filled 2019-01-14: qty 1

## 2019-01-14 NOTE — Progress Notes (Signed)
Patient escorted to PACU for cardioversion. Pt's wife is in the room awaiting patient's return. Pt removed from remote tele. Tele box placed on desk in patient's room for replacement upon his return.

## 2019-01-14 NOTE — H&P (Signed)
Cardiology Admission History and Physical:   Patient ID: [name] MRN: [id number]; DOB: [birth year]   Admission date: 01/13/2019  Primary Care Provider: Glenda Chroman, MD Primary Cardiologist: Kate Sable, MD  Primary Electrophysiologist:  Cristopher Peru, MD   Chief Complaint:  palpitations  Patient Profile:   [name] is a 68 y.o. male with a history of pAF (s/p DCCV x 1 2019, on Tikosyn and Xarelto) as well as HTN who presents with palpitations found to have recurrent AF.   History of Present Illness:   [name] noted onset of palpitations similar to his prior episodes of AF early yesterday morning. He states he could feel his heart beating when lying flat in bed and that his pulse felt irregular. He denied associated chest discomfort or tightness, had no worsening LE or DOE, but did note lightheadedness while walking around the house during the day. All these symptoms were similar to his prior episodes of AF. He presented to [facility] earlier today, was hemodynamically stable with EKG demonstrated rate controlled AF w/out ischemic changes HsTn was flat. Given failure of Tikosyn, decision made to transfer to [facility] for further management.   Upon arrival, patient is asymptomatic. On ROS, no increase in caffeine or EtOH intake. No LE edema, orthopnea, or PND (though has been sleeping in a chair as he can "feel his heartbeat" when he lays flat). Has not missed any doses of Tikosyn or Xarelto.    Past Medical History:  Diagnosis Date  . Arthritis    "knuckles maybe" (02/28/2018)  . Atrial fib/flutter, transient    a. s/p TEE-guided DCCV on 12/14/2017 with return to NSR.   [name] BPH (benign prostatic hyperplasia) 12/11/2017  . Essential hypertension   . Gout    "I take RX qd" (02/28/2018)  . Neuropathy   . Pneumonia 11/2017  . Thyroid nodule    Benign    Past Surgical History:  Procedure Laterality Date  . BACK SURGERY    . BIOPSY THYROID    . CARDIOVERSION  N/A 12/14/2017   Procedure: CARDIOVERSION;  Surgeon: Satira Sark, MD;  Location: [facility];  Service: Cardiovascular;  Laterality: N/A;  . CATARACT EXTRACTION W/ INTRAOCULAR LENS  IMPLANT, BILATERAL Bilateral 2006-2010   "right-left"  . COLONOSCOPY  2012  . IRRIGATION AND DEBRIDEMENT SEBACEOUS CYST    . LUMBAR LAMINECTOMY N/A 01/20/2013   Procedure: MICRODISCECTOMY LUMBAR LAMINECTOMY;  Surgeon: Marybelle Killings, MD;  Location: [facility];  Service: Orthopedics;  Laterality: N/A;  L4-5 Decompression  . PILONIDAL CYST DRAINAGE    . TEE WITHOUT CARDIOVERSION N/A 12/14/2017   Procedure: TRANSESOPHAGEAL ECHOCARDIOGRAM (TEE) WITH PROPOFOL;  Surgeon: Satira Sark, MD;  Location: [facility];  Service: Cardiovascular;  Laterality: N/A;     Medications Prior to Admission: Prior to Admission medications   Medication Sig Start Date End Date Taking? Authorizing Provider  acetaminophen (TYLENOL) 500 MG tablet Take 500-1,000 mg by mouth every 6 (six) hours as needed (for pain.).   Yes [provider]  allopurinol (ZYLOPRIM) 300 MG tablet Take 300 mg by mouth daily as needed.    Yes [provider]  amLODipine (NORVASC) 5 MG tablet Take 1 tablet (5 mg total) by mouth daily. (LUNCH TIME) 07/21/18 01/13/19 Yes Herminio Commons, MD  Calcium Carb-Cholecalciferol (CALCIUM + D3 PO) Take 1 tablet by mouth daily at 3 pm.    Yes [provider]  carvedilol (COREG) 12.5 MG tablet Take 18.75 mg by mouth 2 (two)  times daily with a meal.   Yes [provider]  cetirizine (ZYRTEC) 10 MG tablet Take 10 mg by mouth daily as needed for allergies.   Yes [provider]  Cholecalciferol (VITAMIN D3) 2000 UNITS TABS Take 2,000 Units by mouth daily at 3 pm.    Yes [provider]  CINNAMON PO Take 350 mg by mouth daily at 3 pm.    Yes [provider]  Coenzyme Q10 200 MG capsule Take 200 mg by mouth daily at 3 pm.    Yes [provider]  dofetilide (TIKOSYN)  250 MCG capsule TAKE ONE CAPSULE BY MOUTH TWICE DAILY. 08/31/18  Yes Herminio Commons, MD  Ferrous Fumarate (IRON) 18 MG TBCR Take 18 mg by mouth daily at 3 pm.    Yes [provider]  fish oil-omega-3 fatty acids 1000 MG capsule Take 3,000 mg by mouth daily.    Yes [provider]  Glucosamine-Chondroitin (GLUCOSAMINE CHONDR COMPLEX PO) Take 1 tablet by mouth 3 (three) times a week. 1500mg /1200mg    Yes [provider]  LUTEIN-ZEAXANTHIN PO Take 1 tablet by mouth daily at 3 pm.    Yes [provider]  Multiple Vitamins-Minerals (MULTIVITAMIN PO) Take 1 tablet by mouth daily at 3 pm.    Yes [provider]  olmesartan (BENICAR) 40 MG tablet TAKE ONE TABLET BY MOUTH DAILY 12/27/18  Yes Herminio Commons, MD  PHOSPHATIDYLCHOLINE PO Take 100 mg by mouth daily at 3 pm.    Yes [provider]  potassium chloride (K-DUR,KLOR-CON) 10 MEQ tablet TAKE ONE TABLET BY MOUTH TWICE DAILY. Patient taking differently: Take 20 mEq by mouth daily.  09/07/18  Yes Sherran Needs, NP  Probiotic Product (PROBIOTIC DAILY PO) Take 2 tablets by mouth daily at 3 pm. 5 billion unit daily   Yes [provider]  RESVERATROL 100 MG CAPS Take 100 mg by mouth daily at 3 pm.    Yes [provider]  tamsulosin (FLOMAX) 0.4 MG CAPS capsule Take 0.4 mg by mouth daily as needed.  08/06/15  Yes [provider]  XARELTO 20 MG TABS tablet TAKE ONE TABLET BY MOUTH DAILY WITH LUNCH 12/27/18  Yes Herminio Commons, MD     Allergies:    Allergies  Allergen Reactions  . Azithromycin     QT WAVE    Social History:   Social History   Socioeconomic History  . Marital status: Married    Spouse name: Not on file  . Number of children: 3  . Years of education: Not on file  . Highest education level: Not on file  Occupational History  . Not on file  Social Needs  . Financial resource strain: Not on file  . Food insecurity    Worry: Not on file     Inability: Not on file  . Transportation needs    Medical: Not on file    Non-medical: Not on file  Tobacco Use  . Smoking status: Never Smoker  . Smokeless tobacco: Never Used  Substance and Sexual Activity  . Alcohol use: Never    Alcohol/week: 0.0 standard drinks    Frequency: Never  . Drug use: Never  . Sexual activity: Not Currently  Lifestyle  . Physical activity    Days per week: Not on file    Minutes per session: Not on file  . Stress: Not on file  Relationships  . Social connections    Talks on phone: Not on  file    Gets together: Not on file    Attends religious service: Not on file    Active member of club or organization: Not on file    Attends meetings of clubs or organizations: Not on file    Relationship status: Not on file  . Intimate partner violence    Fear of current or ex partner: Not on file    Emotionally abused: Not on file    Physically abused: Not on file    Forced sexual activity: Not on file  Other Topics Concern  . Not on file  Social History Narrative  . Not on file    Family History:   The patient's family history includes Atrial fibrillation in his mother; Cancer - Lung in his father; Dementia in his mother; Hypertension in an other family member; Liver cancer in his brother; Lung cancer in his father; Transient ischemic attack in his mother.    Review of Systems: [y] = yes, [ ]  = no     General: Weight gain [ ] ; Weight loss [ ] ; Anorexia [ ] ; Fatigue [ ] ; Fever [ ] ; Chills [ ] ; Weakness [ ]    Cardiac: Chest pain/pressure [ ] ; Resting SOB [ ] ; Exertional SOB [ ] ; Orthopnea [ ] ; Pedal Edema [ ] ; Palpitations [ y]; Syncope [ ] ; Presyncope [ y]; Paroxysmal nocturnal dyspnea[ ]    Pulmonary: Cough [ ] ; Wheezing[ ] ; Hemoptysis[ ] ; Sputum [ ] ; Snoring [ ]    GI: Vomiting[ ] ; Dysphagia[ ] ; Melena[ ] ; Hematochezia [ ] ; Heartburn[ ] ; Abdominal pain [ ] ; Constipation [ ] ; Diarrhea [ ] ; BRBPR [ ]    GU: Hematuria[ ] ; Dysuria [ ] ; Nocturia[ ]     Vascular: Pain in legs with walking [ ] ; Pain in feet with lying flat [ ] ; Non-healing sores [ ] ; Stroke [ ] ; TIA [ ] ; Slurred speech [ ] ;   Neuro: Headaches[ ] ; Vertigo[ ] ; Seizures[ ] ; Paresthesias[ ] ;Blurred vision [ ] ; Diplopia [ ] ; Vision changes [ ]    Ortho/Skin: Arthritis [ ] ; Joint pain [ ] ; Muscle pain [ ] ; Joint swelling [ ] ; Back Pain [ ] ; Rash [ ]    Psych: Depression[ ] ; Anxiety[ ]    Heme: Bleeding problems [ ] ; Clotting disorders [ ] ; Anemia [ ]    Endocrine: Diabetes [ ] ; Thyroid dysfunction[ ]   Physical Exam/Data:   Vitals:   01/14/19 0000 01/14/19 0030 01/14/19 0100 01/14/19 0159  BP: (!) 102/57 104/65 121/70 134/90  Pulse: 99 86 97 91  Resp: 17 16 17 18   Temp:    97.9 F (36.6 C)  TempSrc:    Oral  SpO2: 99% 96% 99% 98%  Weight:      Height:       No intake or output data in the 24 hours ending 01/14/19 0207 Filed Weights   01/13/19 1448  Weight: 125.2 kg   Body mass index is 35.44 kg/m.  General:  Well nourished, well developed, in no acute distress HEENT: normal Lymph: no adenopathy Neck: no JVD Cardiac:  normal S1, S2; irregularly irregular; no murmur  Lungs:  clear to auscultation bilaterally, no wheezing, rhonchi or rales  Abd: soft, nontender, no hepatomegaly  Ext: no edema Skin: warm and dry  Neuro:  CNs 2-12 intact, no focal abnormalities noted Psych:  Normal affect   EKG:  The ECG that was done was personally reviewed and demonstrates rate controlled AF.   Relevant CV Studies: Study Conclusions  - Left ventricle: EF difficult to estimate due to presence  of   atrial fibrillation and variable heart rate. Appears mildly   depressed. The cavity size was normal. Wall thickness was   increased in a pattern of mild LVH. Systolic function was mildly   reduced. The estimated ejection fraction was in the range of 45%   to 50%. Wall motion was normal; there were no regional wall   motion abnormalities. - Left atrium: The atrium was moderately  dilated. - Pulmonary arteries: Systolic pressure was mildly increased. PA   peak pressure: 32 mm Hg (S).  Laboratory Data:  Chemistry Recent Labs  Lab 01/13/19 1605  NA 140  K 4.0  CL 108  CO2 25  GLUCOSE 110*  BUN 16  CREATININE 0.87  CALCIUM 8.9  GFRNONAA >60  GFRAA >60  ANIONGAP 7    No results for input(s): PROT, ALBUMIN, AST, ALT, ALKPHOS, BILITOT in the last 168 hours. Hematology Recent Labs  Lab 01/13/19 1605  WBC 6.6  RBC 4.94  HGB 14.5  HCT 45.2  MCV 91.5  MCH 29.4  MCHC 32.1  RDW 12.6  PLT 140*   Cardiac EnzymesNo results for input(s): TROPONINI in the last 168 hours. No results for input(s): TROPIPOC in the last 168 hours.  BNPNo results for input(s): BNP, PROBNP in the last 168 hours.  DDimer No results for input(s): DDIMER in the last 168 hours.  Radiology/Studies:  Dg Chest Port 1 View  Result Date: 01/13/2019 CLINICAL DATA:  Palpitations and sob. Hx of afib EXAM: PORTABLE CHEST 1 VIEW COMPARISON:  07/10/2018 FINDINGS: Heart is enlarged and stable in configuration. There is mild pulmonary vascular congestion but no overt edema. No focal consolidations or pleural effusions. IMPRESSION: Stable cardiomegaly. Electronically Signed   By: Nolon Nations M.D.   On: 01/13/2019 16:22    Assessment and Plan:   Mr. Arwine is a 68 year old gentleman with pAF on Xarelto and Tikosyn who presents with palpitations found to have recurrent AF with RVR.   #Recurrent AF -- Continue Tikosyn (QTC < 480).  -- Anticoagulated with Xarelto (has not missed any doses in last month) -- NPO @ [facility]; will discuss possibility of DCCV if does not convert tonight.  -- Mg > 2, K > 4 -- Will check TSH -- TTE to reassess function -- Check BNP -- hsTn flat  #HTN -- Continue home amlodipine, olmesartan -- Continue carvedilol 18.75 BID.   #Thrombocytopenia, Chronic -- Will check LFTs.    Severity of Illness: The appropriate patient status for this patient is  OBSERVATION. Observation status is judged to be reasonable and necessary in order to provide the required intensity of service to ensure the patient's safety. The patient's presenting symptoms, physical exam findings, and initial radiographic and laboratory data in the context of their medical condition is felt to place them at decreased risk for further clinical deterioration. Furthermore, it is anticipated that the patient will be medically stable for discharge from the hospital within 2 midnights of admission. The following factors support the patient status of observation.   " The patient's presenting symptoms include atrial fibrillation. " The physical exam findings include tachycardia " The initial radiographic and laboratory data are reassuring.     For questions or updates, please contact Jay Please consult [email or web address] for contact info under    Signed, Milus Banister, MD  01/14/2019 2:07 AM

## 2019-01-14 NOTE — Anesthesia Procedure Notes (Signed)
Procedure Name: General with mask airway Date/Time: 01/14/2019 10:26 AM Performed by: Kyung Rudd, CRNA Pre-anesthesia Checklist: Patient identified, Emergency Drugs available, Suction available, Patient being monitored and Timeout performed Patient Re-evaluated:Patient Re-evaluated prior to induction Oxygen Delivery Method: Ambu bag Preoxygenation: Pre-oxygenation with 100% oxygen Induction Type: IV induction Ventilation: Mask ventilation without difficulty Dental Injury: Teeth and Oropharynx as per pre-operative assessment

## 2019-01-14 NOTE — Discharge Summary (Addendum)
Discharge Summary    Patient ID: [name] MRN: [id number]; DOB: [birth year]  Admit date: 01/13/2019 Discharge date: 01/14/2019  Primary Care Provider: Glenda Chroman, MD  Primary Cardiologist: Kate Sable, MD  Primary Electrophysiologist:  Cristopher Peru, MD   Discharge Diagnoses    Active Problems:   Atrial fibrillation with rapid ventricular response [facility])   Atrial fibrillation (HCC)  Allergies Allergies  Allergen Reactions   Azithromycin     QT WAVE   Diagnostic Studies/Procedures    DCCV 01/14/2019: Successful cardioversion from AF to NSR    History of Present Illness     [name] is a 68 y.o. male with a history of pAF (s/p DCCV x 1 2019, on Tikosyn and Xarelto) as well as HTN who presented to [facility] on 01/14/2019 with palpitations found to have recurrent AF.  Hospital Course     [name] noted onset of palpitations similar to his prior episodes of AF early yesterday morning on 01/13/2019. He stated that he could feel his heart beating when lying flat in bed and that his pulse felt irregular. He denied associated chest discomfort or tightness, had no worsening LE or DOE, but did note lightheadedness while walking around the house during the day. All these symptoms were similar to his prior episodes of AF. He presented to [facility], was hemodynamically stable with EKG that demonstrated rate controlled AF w/out ischemic changes HsTn was flat. Given failure of Tikosyn, decision made to transfer to [facility] for further management.   Upon arrival, patient was asymptomatic. On ROS, no increase in caffeine or EtOH intake. No LE edema, orthopnea, or PND (though has been sleeping in a chair as he can "feel his heartbeat" when he lays flat). Has not missed any doses of Tikosyn or Xarelto.   Plan was for DCCV today per Dr. Curt Bears for persistent atrial fibrillation. He was sedated per anesthesia and was successfully cardioverted with 120J synchronized  shock. Immediately thereafter, he was in isinus bradycardia in the 30's which increased to 55bpm. There are no medication changes and he [name] be discharged today with close follow up in the [facility] in 2 weeks.   Consultants: EP   The patient was seen and examined by Dr. Curt Bears who feels that he is stable and ready for discharge today, 01/14/2019.  _____________  Discharge Vitals Blood pressure (!) 97/59, pulse (!) 54, temperature 97.8 F (36.6 C), temperature source Oral, resp. rate 16, height 6\' 2"  (1.88 m), weight 125.2 kg, SpO2 96 %.  Filed Weights   01/13/19 1448  Weight: 125.2 kg   Labs & Radiologic Studies    CBC Recent Labs    01/13/19 1605  WBC 6.6  NEUTROABS 5.1  HGB 14.5  HCT 45.2  MCV 91.5  PLT [id number]*   Basic Metabolic Panel Recent Labs    01/13/19 1605 01/14/19 0335  NA 140 141  K 4.0 3.8  CL 108 108  CO2 25 24  GLUCOSE 110* 110*  BUN 16 19  CREATININE 0.87 1.05  CALCIUM 8.9 8.7*  MG 2.3 2.1   Liver Function Tests Recent Labs    01/14/19 0335  AST 19  ALT 19  ALKPHOS 54  BILITOT 1.6*  PROT 6.8  ALBUMIN 3.6   Thyroid Function Tests Recent Labs    01/14/19 0335  TSH 4.233   _____________  Dg Chest Port 1 View  Result Date: 01/13/2019 CLINICAL DATA:  Palpitations and sob. Hx of afib EXAM: PORTABLE  CHEST 1 VIEW COMPARISON:  07/10/2018 FINDINGS: Heart is enlarged and stable in configuration. There is mild pulmonary vascular congestion but no overt edema. No focal consolidations or pleural effusions. IMPRESSION: Stable cardiomegaly. Electronically Signed   By: Nolon Nations M.D.   On: 01/13/2019 16:22   Disposition   Pt is being discharged home today in good condition.  Follow-up Plans & Appointments    Follow-up Information    [facility] Follow up.   Specialty: Cardiology Why: Please expect a phone call with date and time of your follow up appointment  Contact information: [address] [id number] [place] [phone]         Discharge Instructions    Call MD for:  difficulty breathing, headache or visual disturbances   Complete by: As directed    Call MD for:  extreme fatigue   Complete by: As directed    Call MD for:  hives   Complete by: As directed    Call MD for:  persistant dizziness or light-headedness   Complete by: As directed    Call MD for:  persistant nausea and vomiting   Complete by: As directed    Call MD for:  redness, tenderness, or signs of infection (pain, swelling, redness, odor or green/yellow discharge around incision site)   Complete by: As directed    Call MD for:  severe uncontrolled pain   Complete by: As directed    Call MD for:  temperature >100.4   Complete by: As directed    Diet - low sodium heart healthy   Complete by: As directed    Discharge instructions   Complete by: As directed    Please expect a call from the Atrial Fibrillation office early next week to schedule your follow up appointment.   Increase activity slowly   Complete by: As directed      Discharge Medications   Allergies as of 01/14/2019      Reactions   Azithromycin    QT WAVE      Medication List    TAKE these medications   acetaminophen 500 MG tablet Commonly known as: TYLENOL Take 500-1,000 mg by mouth every 6 (six) hours as needed (for pain.).   allopurinol 300 MG tablet Commonly known as: ZYLOPRIM Take 300 mg by mouth daily as needed.   amLODipine 5 MG tablet Commonly known as: NORVASC Take 1 tablet (5 mg total) by mouth daily. (LUNCH TIME)   CALCIUM + D3 PO Take 1 tablet by mouth daily at 3 pm.   carvedilol 12.5 MG tablet Commonly known as: COREG Take 18.75 mg by mouth 2 (two) times daily with a meal.   cetirizine 10 MG tablet Commonly known as: ZYRTEC Take 10 mg by mouth daily as needed for allergies.   CINNAMON PO Take 350 mg by mouth daily at 3 pm.   Coenzyme Q10 200 MG capsule Take  200 mg by mouth daily at 3 pm.   dofetilide 250 MCG capsule Commonly known as: TIKOSYN TAKE ONE CAPSULE BY MOUTH TWICE DAILY.   fish oil-omega-3 fatty acids 1000 MG capsule Take 3,000 mg by mouth daily.   GLUCOSAMINE CHONDR COMPLEX PO Take 1 tablet by mouth 3 (three) times a week. 1500mg /1200mg    Iron 18 MG Tbcr Take 18 mg by mouth daily at 3 pm.   LUTEIN-ZEAXANTHIN PO Take 1 tablet by mouth daily at 3 pm.   MULTIVITAMIN PO Take 1 tablet by mouth daily  at 3 pm.   olmesartan 40 MG tablet Commonly known as: BENICAR TAKE ONE TABLET BY MOUTH DAILY   PHOSPHATIDYLCHOLINE PO Take 100 mg by mouth daily at 3 pm.   potassium chloride 10 MEQ tablet Commonly known as: K-DUR TAKE ONE TABLET BY MOUTH TWICE DAILY. What changed:   how much to take  when to take this   PROBIOTIC DAILY PO Take 2 tablets by mouth daily at 3 pm. 5 billion unit daily   Resveratrol 100 MG Caps Take 100 mg by mouth daily at 3 pm.   tamsulosin 0.4 MG Caps capsule Commonly known as: FLOMAX Take 0.4 mg by mouth daily as needed.   Vitamin D3 50 MCG (2000 UT) Tabs Take 2,000 Units by mouth daily at 3 pm.   Xarelto 20 MG Tabs tablet Generic drug: rivaroxaban TAKE ONE TABLET BY MOUTH DAILY WITH LUNCH        Acute coronary syndrome (MI, NSTEMI, STEMI, etc) this admission?: No.    Outstanding Labs/Studies   None   Duration of Discharge Encounter   Greater than 30 minutes including physician time.  Signed, Kathyrn Drown, NP 01/14/2019, 2:30 PM    I have seen and examined this patient with Kathyrn Drown.  Agree with above, note added to reflect my findings.  On exam, RRR, no murmurs, lungs clear. Patient presented to the hospital in atrial fibrillation with palpitations and lightheadedness. Had been compliant with xarelto and tikosyn. Had cardioversion with return to sinus rhythm. Discharge with follow up in AF clinic.    Zoiee Wimmer M. Kailiana Granquist MD 01/14/2019 3:41 PM

## 2019-01-14 NOTE — Procedures (Addendum)
Patient sedated per anesthesia report. Patient cardioverted with one 120J synchronized shock. Immediately  thereafter patient had sinus bradycardia in the 30s which increased to 55 bpm.   Ashantae Pangallo Curt Bears, MD 01/14/2019 10:41 AM

## 2019-01-14 NOTE — Plan of Care (Signed)

## 2019-01-14 NOTE — Anesthesia Preprocedure Evaluation (Addendum)
Anesthesia Evaluation  Patient identified by MRN, date of birth, ID band Patient awake    Reviewed: Allergy & Precautions, NPO status , Patient's Chart, lab work & pertinent test results  Airway Mallampati: II       Dental  (+) Teeth Intact   Pulmonary neg pulmonary ROS,    Pulmonary exam normal        Cardiovascular hypertension, Pt. on medications and Pt. on home beta blockers + CAD and +CHF  + dysrhythmias Atrial Fibrillation  Rhythm:Irregular Rate:Tachycardia  ECG: a-fib, LVH, rate 95   Neuro/Psych negative neurological ROS     GI/Hepatic negative GI ROS, Neg liver ROS,   Endo/Other  negative endocrine ROS  Renal/GU negative Renal ROS     Musculoskeletal Gout   Abdominal (+) + obese,   Peds  Hematology negative hematology ROS (+)   Anesthesia Other Findings atrial fibrillation  Reproductive/Obstetrics                           Anesthesia Physical Anesthesia Plan  ASA: III  Anesthesia Plan: General   Post-op Pain Management:    Induction: Intravenous  PONV Risk Score and Plan: 2 and Propofol infusion and Treatment may vary due to age or medical condition  Airway Management Planned: Mask  Additional Equipment:   Intra-op Plan:   Post-operative Plan:   Informed Consent: I have reviewed the patients History and Physical, chart, labs and discussed the procedure including the risks, benefits and alternatives for the proposed anesthesia with the patient or authorized representative who has indicated his/her understanding and acceptance.     Dental advisory given  Plan Discussed with: CRNA  Anesthesia Plan Comments:        Anesthesia Quick Evaluation

## 2019-01-14 NOTE — Anesthesia Postprocedure Evaluation (Signed)
Anesthesia Post Note  Patient: [name]  Procedure(s) Performed: CARDIOVERSION (N/A )     Patient location during evaluation: PACU Anesthesia Type: General Level of consciousness: awake and alert Pain management: pain level controlled Vital Signs Assessment: post-procedure vital signs reviewed and stable Respiratory status: spontaneous breathing, nonlabored ventilation, respiratory function stable and patient connected to nasal cannula oxygen Cardiovascular status: blood pressure returned to baseline and stable Postop Assessment: no apparent nausea or vomiting Anesthetic complications: no    Last Vitals:  Vitals:   01/14/19 1200 01/14/19 1208  BP: (!) 97/59   Pulse:    Resp: 13 16  Temp: 36.6 C   SpO2:      Last Pain:  Vitals:   01/14/19 1200  TempSrc: Oral  PainSc:                  Ryan P Ellender

## 2019-01-14 NOTE — Care Management Obs Status (Signed)
[facility] NOTIFICATION   Patient Details  Name: MINARD BORJAS MRN: [id number] Date of Birth: 04-28-1951   Medicare Observation Status Notification Given:  Yes    Carles Collet, RN 01/14/2019, 3:04 PM

## 2019-01-14 NOTE — Consult Note (Addendum)
Cardiology Consultation:   Patient ID: UGONNA LAMOS MRN: [name]; DOB: 19-May-1951  Admit date: 01/13/2019 Date of Consult: 01/14/2019  Primary Care Provider: Glenda Chroman, MD Primary Cardiologist: Kate Sable, MD  Primary Electrophysiologist:  Cristopher Peru, MD    Patient Profile:   [name] is a 68 y.o. male with a hx of atrial fibrillation, hypertension who is being seen today for the evaluation of atrial fibrillation at the request of Winfield Cunas.  History of Present Illness:   [name] is a 68 year old male with a history of atrial fibrillation on Tikosyn and Xarelto who presented to the hospital with palpitations.  This is not associated with chest discomfort or tightness.  He had no worsening lower extremity edema or dyspnea on exertion.  He was lightheaded while walking around his house.  All the symptoms were similar to his prior episodes of atrial fibrillation.  He was transferred to [facility] for further management of atrial fibrillation.    Heart Pathway Score:     Past Medical History:  Diagnosis Date  . Arthritis    "knuckles maybe" (02/28/2018)  . Atrial fib/flutter, transient    a. s/p TEE-guided DCCV on 12/14/2017 with return to NSR.   [name] BPH (benign prostatic hyperplasia) 12/11/2017  . Essential hypertension   . Gout    "I take RX qd" (02/28/2018)  . Neuropathy   . Pneumonia 11/2017  . Thyroid nodule    Benign    Past Surgical History:  Procedure Laterality Date  . BACK SURGERY    . BIOPSY THYROID    . CARDIOVERSION N/A 12/14/2017   Procedure: CARDIOVERSION;  Surgeon: Satira Sark, MD;  Location: [facility];  Service: Cardiovascular;  Laterality: N/A;  . CATARACT EXTRACTION W/ INTRAOCULAR LENS  IMPLANT, BILATERAL Bilateral 2006-2010   "right-left"  . COLONOSCOPY  2012  . IRRIGATION AND DEBRIDEMENT SEBACEOUS CYST    . LUMBAR LAMINECTOMY N/A 01/20/2013   Procedure: MICRODISCECTOMY LUMBAR LAMINECTOMY;  Surgeon: Marybelle Killings,  MD;  Location: [facility];  Service: Orthopedics;  Laterality: N/A;  L4-5 Decompression  . PILONIDAL CYST DRAINAGE    . TEE WITHOUT CARDIOVERSION N/A 12/14/2017   Procedure: TRANSESOPHAGEAL ECHOCARDIOGRAM (TEE) WITH PROPOFOL;  Surgeon: Satira Sark, MD;  Location: [facility];  Service: Cardiovascular;  Laterality: N/A;     Home Medications:  Prior to Admission medications   Medication Sig Start Date End Date Taking? Authorizing Provider  acetaminophen (TYLENOL) 500 MG tablet Take 500-1,000 mg by mouth every 6 (six) hours as needed (for pain.).   Yes [provider]  allopurinol (ZYLOPRIM) 300 MG tablet Take 300 mg by mouth daily as needed.    Yes [provider]  amLODipine (NORVASC) 5 MG tablet Take 1 tablet (5 mg total) by mouth daily. (LUNCH TIME) 07/21/18 01/13/19 Yes Herminio Commons, MD  Calcium Carb-Cholecalciferol (CALCIUM + D3 PO) Take 1 tablet by mouth daily at 3 pm.    Yes [provider]  carvedilol (COREG) 12.5 MG tablet Take 18.75 mg by mouth 2 (two) times daily with a meal.   Yes [provider]  cetirizine (ZYRTEC) 10 MG tablet Take 10 mg by mouth daily as needed for allergies.   Yes [provider]  Cholecalciferol (VITAMIN D3) 2000 UNITS TABS Take 2,000 Units by mouth daily at 3 pm.    Yes [provider]  CINNAMON PO Take 350 mg by mouth daily at 3 pm.    Yes [provider]  Coenzyme Q10 200 MG capsule Take 200 mg by mouth daily at 3 pm.    Yes [provider]  dofetilide (TIKOSYN) 250 MCG capsule TAKE ONE CAPSULE BY MOUTH TWICE DAILY. 08/31/18  Yes Herminio Commons, MD  Ferrous Fumarate (IRON) 18 MG TBCR Take 18 mg by mouth daily at 3 pm.    Yes [provider]  fish oil-omega-3 fatty acids 1000 MG capsule Take 3,000 mg by mouth daily.    Yes [provider]  Glucosamine-Chondroitin (GLUCOSAMINE CHONDR COMPLEX PO) Take 1 tablet by mouth 3 (three) times a week. 1500mg /1200mg    Yes  [provider]  LUTEIN-ZEAXANTHIN PO Take 1 tablet by mouth daily at 3 pm.    Yes [provider]  Multiple Vitamins-Minerals (MULTIVITAMIN PO) Take 1 tablet by mouth daily at 3 pm.    Yes [provider]  olmesartan (BENICAR) 40 MG tablet TAKE ONE TABLET BY MOUTH DAILY 12/27/18  Yes Herminio Commons, MD  PHOSPHATIDYLCHOLINE PO Take 100 mg by mouth daily at 3 pm.    Yes [provider]  potassium chloride (K-DUR,KLOR-CON) 10 MEQ tablet TAKE ONE TABLET BY MOUTH TWICE DAILY. Patient taking differently: Take 20 mEq by mouth daily.  09/07/18  Yes Sherran Needs, NP  Probiotic Product (PROBIOTIC DAILY PO) Take 2 tablets by mouth daily at 3 pm. 5 billion unit daily   Yes [provider]  RESVERATROL 100 MG CAPS Take 100 mg by mouth daily at 3 pm.    Yes [provider]  tamsulosin (FLOMAX) 0.4 MG CAPS capsule Take 0.4 mg by mouth daily as needed.  08/06/15  Yes [provider]  XARELTO 20 MG TABS tablet TAKE ONE TABLET BY MOUTH DAILY WITH LUNCH 12/27/18  Yes Herminio Commons, MD    Inpatient Medications: Scheduled Meds: . amLODipine  5 mg Oral Daily  . carvedilol  18.75 mg Oral BID WC  . dofetilide  250 mcg Oral BID  . Ferrous Fumarate  1 tablet Oral Q1500  . olmesartan  40 mg Oral Daily  . rivaroxaban  20 mg Oral Daily  . tamsulosin  0.4 mg Oral Daily   Continuous Infusions:  PRN Meds: acetaminophen  Allergies:    Allergies  Allergen Reactions  . Azithromycin     QT WAVE    Social History:   Social History   Socioeconomic History  . Marital status: Married    Spouse name: Not on file  . Number of children: 3  . Years of education: Not on file  . Highest education level: Not on file  Occupational History  . Not on file  Social Needs  . Financial resource strain: Not on file  . Food insecurity    Worry: Not on file    Inability: Not on file  . Transportation needs    Medical: Not on file    Non-medical:  Not on file  Tobacco Use  . Smoking status: Never Smoker  . Smokeless tobacco: Never Used  Substance and Sexual Activity  . Alcohol use: Never    Alcohol/week: 0.0 standard drinks    Frequency: Never  . Drug use: Never  . Sexual activity: Not Currently  Lifestyle  . Physical activity    Days per week: Not on file    Minutes per session: Not on file  . Stress: Not on file  Relationships  . Social Herbalist on phone: Not on file    Gets together: Not  on file    Attends religious service: Not on file    Active member of club or organization: Not on file    Attends meetings of clubs or organizations: Not on file    Relationship status: Not on file  . Intimate partner violence    Fear of current or ex partner: Not on file    Emotionally abused: Not on file    Physically abused: Not on file    Forced sexual activity: Not on file  Other Topics Concern  . Not on file  Social History Narrative  . Not on file    Family History:    Family History  Problem Relation Age of Onset  . Lung cancer Father   . Cancer - Lung Father   . Atrial fibrillation Mother   . Dementia Mother   . Transient ischemic attack Mother   . Hypertension Other   . Liver cancer Brother      ROS:  Please see the history of present illness.   All other ROS reviewed and negative.     Physical Exam/Data:   Vitals:   01/14/19 0159 01/14/19 0456 01/14/19 0548 01/14/19 0754  BP: 134/90 98/61  102/61  Pulse: 91 (!) 101 94 81  Resp: 18 15    Temp: 97.9 F (36.6 C) 97.9 F (36.6 C)  98.2 F (36.8 C)  TempSrc: Oral Oral  Oral  SpO2: 98% 98%  96%  Weight:      Height:        Intake/Output Summary (Last 24 hours) at 01/14/2019 0821 Last data filed at 01/14/2019 0543 Gross per 24 hour  Intake -  Output 100 ml  Net -100 ml   Last 3 Weights 01/13/2019 11/03/2018 10/19/2018  Weight (lbs) 276 lb 275 lb 266 lb  Weight (kg) 125.193 kg 124.739 kg 120.657 kg     Body mass index is 35.44 kg/m.   General:  Well nourished, well developed, in no acute distress HEENT: normal Lymph: no adenopathy Neck: no JVD Endocrine:  No thryomegaly Vascular: No carotid bruits; FA pulses 2+ bilaterally without bruits  Cardiac:  iRRR; no murmur  Lungs:  clear to auscultation bilaterally, no wheezing, rhonchi or rales  Abd: soft, nontender, no hepatomegaly  Ext: no edema Musculoskeletal:  No deformities, BUE and BLE strength normal and equal Skin: warm and dry  Neuro:  CNs 2-12 intact, no focal abnormalities noted Psych:  Normal affect   EKG:  The EKG was personally reviewed and demonstrates: Atrial fibrillation, LVH, rate 95 Telemetry:  Telemetry was personally reviewed and demonstrates: Atrial fibrillation  Relevant CV Studies: TTE 11/2017 - Left ventricle: The estimated ejection fraction was 55%. Wall   motion was normal; there were no regional wall motion   abnormalities. No evidence of thrombus. - Aortic valve: There was trivial regurgitation. - Descending aorta: The descending aorta had mild diffuse disease. - Mitral valve: There was mild regurgitation. - Left atrium: The atrium was moderately dilated. No evidence of   thrombus in the atrial cavity or appendage. Emptying velocity was   normal. - Right atrium: The atrium was dilated. No evidence of thrombus in   the atrial cavity or appendage. - Atrial septum: The interatrial septum was hypermobile. No defect   or patent foramen ovale was identified based on color Doppler   evaluation. - Tricuspid valve: There was mild regurgitation. - Impressions: Successful cardioversion. No cardiac source of   emboli was identified.  Laboratory Data:  High Sensitivity Troponin:  Recent Labs  Lab 01/13/19 1605 01/13/19 1805  TROPONINIHS 3 3     Cardiac EnzymesNo results for input(s): TROPONINI in the last 168 hours. No results for input(s): TROPIPOC in the last 168 hours.  Chemistry Recent Labs  Lab 01/13/19 1605 01/14/19 0335  NA 140  141  K 4.0 3.8  CL 108 108  CO2 25 24  GLUCOSE 110* 110*  BUN 16 19  CREATININE 0.87 1.05  CALCIUM 8.9 8.7*  GFRNONAA >60 >60  GFRAA >60 >60  ANIONGAP 7 9    Recent Labs  Lab 01/14/19 0335  PROT 6.8  ALBUMIN 3.6  AST 19  ALT 19  ALKPHOS 54  BILITOT 1.6*   Hematology Recent Labs  Lab 01/13/19 1605  WBC 6.6  RBC 4.94  HGB 14.5  HCT 45.2  MCV 91.5  MCH 29.4  MCHC 32.1  RDW 12.6  PLT 140*   BNP Recent Labs  Lab 01/14/19 0335  BNP 312.2*    DDimer No results for input(s): DDIMER in the last 168 hours.   Radiology/Studies:  Dg Chest Port 1 View  Result Date: 01/13/2019 CLINICAL DATA:  Palpitations and sob. Hx of afib EXAM: PORTABLE CHEST 1 VIEW COMPARISON:  07/10/2018 FINDINGS: Heart is enlarged and stable in configuration. There is mild pulmonary vascular congestion but no overt edema. No focal consolidations or pleural effusions. IMPRESSION: Stable cardiomegaly. Electronically Signed   By: Nolon Nations M.D.   On: 01/13/2019 16:22    Assessment and Plan:   1. Persistent atrial fibrillation: Currently on dofetilide and Xarelto.  He has missed no doses.  Due to that, we Cherylann Hobday plan for cardioversion today.  If he does well with anesthesia, likely discharge later today.  He Iyonna Rish need close follow-up with both atrial fibrillation clinic or Dr. Lovena Le in clinic.  He may be a candidate for atrial fibrillation ablation as he has continued to have atrial fibrillation despite dofetilide.   This patients CHA2DS2-VASc Score and unadjusted Ischemic Stroke Rate (% per year) is equal to 2.2 % stroke rate/year from a score of 2  Above score calculated as 1 point each if present [CHF, HTN, DM, Vascular=MI/PAD/Aortic Plaque, Age if 65-74, or Male] Above score calculated as 2 points each if present [Age > 75, or Stroke/TIA/TE]  2. Hypertension: Continue amlodipine and olmesartan.  Continue home dose of carvedilol.   For questions or updates, please contact [facility]  Please consult [email or web address] for contact info under     Signed, Alesandra Smart Meredith Leeds, MD  01/14/2019 8:21 AM

## 2019-01-14 NOTE — Transfer of Care (Signed)
Immediate Anesthesia Transfer of Care Note  Patient: [name]  Procedure(s) Performed: CARDIOVERSION (N/A )  Patient Location: PACU  Anesthesia Type:General  Level of Consciousness: awake, alert  and oriented  Airway & Oxygen Therapy: Patient Spontanous Breathing  Post-op Assessment: Report given to RN, Post -op Vital signs reviewed and stable and Patient moving all extremities X 4  Post vital signs: Reviewed and stable  Last Vitals:  Vitals Value Taken Time  BP 81/42 01/14/19 1041  Temp    Pulse 56 01/14/19 1042  Resp 20 01/14/19 1042  SpO2 94 % 01/14/19 1042    Last Pain:  Vitals:   01/14/19 0754  TempSrc: Oral  PainSc:          Complications: No apparent anesthesia complications

## 2019-01-14 NOTE — Care Management CC44 (Signed)
Condition Code 44 Documentation Completed  Patient Details  Name: [name] MRN: [id number] Date of Birth: [birth year]   Condition Code 44 given:  Yes Patient signature on Condition Code 44 notice:  Yes Documentation of 2 MD's agreement:  Yes Code 44 added to claim:  Yes    Carles Collet, RN 01/14/2019, 3:04 PM

## 2019-01-14 NOTE — Progress Notes (Signed)
Call placed to [facility] to notify of telemetry monitoring d/c.   

## 2019-01-14 NOTE — Progress Notes (Signed)
Notified provider pt arrive in room

## 2019-01-16 DIAGNOSIS — I10 Essential (primary) hypertension: Secondary | ICD-10-CM | POA: Diagnosis not present

## 2019-01-18 ENCOUNTER — Encounter (HOSPITAL_COMMUNITY): Payer: Self-pay | Admitting: Cardiology

## 2019-01-20 DIAGNOSIS — I10 Essential (primary) hypertension: Secondary | ICD-10-CM | POA: Diagnosis not present

## 2019-01-20 DIAGNOSIS — Z6835 Body mass index (BMI) 35.0-35.9, adult: Secondary | ICD-10-CM | POA: Diagnosis not present

## 2019-01-20 DIAGNOSIS — Z299 Encounter for prophylactic measures, unspecified: Secondary | ICD-10-CM | POA: Diagnosis not present

## 2019-01-20 DIAGNOSIS — I4891 Unspecified atrial fibrillation: Secondary | ICD-10-CM | POA: Diagnosis not present

## 2019-01-20 DIAGNOSIS — I429 Cardiomyopathy, unspecified: Secondary | ICD-10-CM | POA: Diagnosis not present

## 2019-01-31 ENCOUNTER — Ambulatory Visit (HOSPITAL_COMMUNITY)
Admission: RE | Admit: 2019-01-31 | Discharge: 2019-01-31 | Disposition: A | Discharge status: Home / Self Care | Payer: Medicare Other | Source: Ambulatory Visit | Attending: Nurse Practitioner | Admitting: Nurse Practitioner

## 2019-01-31 ENCOUNTER — Other Ambulatory Visit: Payer: Self-pay

## 2019-01-31 ENCOUNTER — Encounter (HOSPITAL_COMMUNITY): Payer: Self-pay | Admitting: Nurse Practitioner

## 2019-01-31 VITALS — BP 142/84 | HR 59 | Ht 74.0 in | Wt 275.8 lb

## 2019-01-31 DIAGNOSIS — Z79899 Other long term (current) drug therapy: Secondary | ICD-10-CM | POA: Insufficient documentation

## 2019-01-31 DIAGNOSIS — M109 Gout, unspecified: Secondary | ICD-10-CM | POA: Insufficient documentation

## 2019-01-31 DIAGNOSIS — Z8249 Family history of ischemic heart disease and other diseases of the circulatory system: Secondary | ICD-10-CM | POA: Diagnosis not present

## 2019-01-31 DIAGNOSIS — M1990 Unspecified osteoarthritis, unspecified site: Secondary | ICD-10-CM | POA: Insufficient documentation

## 2019-01-31 DIAGNOSIS — I10 Essential (primary) hypertension: Secondary | ICD-10-CM | POA: Insufficient documentation

## 2019-01-31 DIAGNOSIS — I4892 Unspecified atrial flutter: Secondary | ICD-10-CM | POA: Insufficient documentation

## 2019-01-31 DIAGNOSIS — Z7901 Long term (current) use of anticoagulants: Secondary | ICD-10-CM | POA: Insufficient documentation

## 2019-01-31 DIAGNOSIS — I4819 Other persistent atrial fibrillation: Secondary | ICD-10-CM | POA: Insufficient documentation

## 2019-01-31 DIAGNOSIS — E041 Nontoxic single thyroid nodule: Secondary | ICD-10-CM | POA: Diagnosis not present

## 2019-01-31 LAB — BASIC METABOLIC PANEL
Anion gap: 9 (ref 5–15)
BUN: 17 mg/dL (ref 8–23)
CO2: 24 mmol/L (ref 22–32)
Calcium: 8.6 mg/dL — ABNORMAL LOW (ref 8.9–10.3)
Chloride: 108 mmol/L (ref 98–111)
Creatinine, Ser: 0.77 mg/dL (ref 0.61–1.24)
GFR calc Af Amer: >60 mL/min (ref >60)
GFR calc non Af Amer: >60 mL/min (ref >60)
Glucose, Bld: 110 mg/dL — ABNORMAL HIGH (ref 70–99)
Potassium: 3.8 mmol/L (ref 3.5–5.1)
Sodium: 141 mmol/L (ref 135–145)

## 2019-01-31 LAB — MAGNESIUM: Magnesium: 2 mg/dL (ref 1.7–2.4)

## 2019-01-31 NOTE — Progress Notes (Addendum)
Primary Care Physician: Glenda Chroman, MD Referring Physician:Dr. Dylanjames Huisman is a 68 y.o. male with a h/o paroxysmal afib and HTN that is in the afib clinic for f/u. Patient was recently admitted for afib with RVR and underwent successful DCCV on 01/14/19. Patient admits that he feels that he is having more episodes of paroxysmal afib between his cardioversions. He denies any specific triggers. He is in SR today.  Today, he denies symptoms of chest pain, shortness of breath, orthopnea, PND, lower extremity edema, dizziness, presyncope, syncope, or neurologic sequela. The patient is tolerating medications without difficulties and is otherwise without complaint today.   Past Medical History:  Diagnosis Date  . Arthritis    "knuckles maybe" (02/28/2018)  . Atrial fib/flutter, transient    a. s/p TEE-guided DCCV on 12/14/2017 with return to NSR.   [name] BPH (benign prostatic hyperplasia) 12/11/2017  . Essential hypertension   . Gout    "I take RX qd" (02/28/2018)  . Neuropathy   . Pneumonia 11/2017  . Thyroid nodule    Benign   Past Surgical History:  Procedure Laterality Date  . BACK SURGERY    . BIOPSY THYROID    . CARDIOVERSION N/A 12/14/2017   Procedure: CARDIOVERSION;  Surgeon: Satira Sark, MD;  Location: [facility];  Service: Cardiovascular;  Laterality: N/A;  . CARDIOVERSION N/A 01/14/2019   Procedure: CARDIOVERSION;  Surgeon: Constance Haw, MD;  Location: [facility];  Service: Cardiovascular;  Laterality: N/A;  . CATARACT EXTRACTION W/ INTRAOCULAR LENS  IMPLANT, BILATERAL Bilateral 2006-2010   "right-left"  . COLONOSCOPY  2012  . IRRIGATION AND DEBRIDEMENT SEBACEOUS CYST    . LUMBAR LAMINECTOMY N/A 01/20/2013   Procedure: MICRODISCECTOMY LUMBAR LAMINECTOMY;  Surgeon: Marybelle Killings, MD;  Location: [facility];  Service: Orthopedics;  Laterality: N/A;  L4-5 Decompression  . PILONIDAL CYST DRAINAGE    . TEE WITHOUT CARDIOVERSION N/A 12/14/2017   Procedure:  TRANSESOPHAGEAL ECHOCARDIOGRAM (TEE) WITH PROPOFOL;  Surgeon: Satira Sark, MD;  Location: [facility];  Service: Cardiovascular;  Laterality: N/A;    Current Outpatient Medications  Medication Sig Dispense Refill  . acetaminophen (TYLENOL) 500 MG tablet Take 500-1,000 mg by mouth every 6 (six) hours as needed (for pain.).    [name] allopurinol (ZYLOPRIM) 300 MG tablet Take 300 mg by mouth daily as needed.     [name] amLODipine (NORVASC) 5 MG tablet Take 1 tablet (5 mg total) by mouth daily. (LUNCH TIME) 30 tablet 6  . Calcium Carb-Cholecalciferol (CALCIUM + D3 PO) Take 1 tablet by mouth daily at 3 pm.     . carvedilol (COREG) 12.5 MG tablet Take 18.75 mg by mouth 2 (two) times daily with a meal.    . cetirizine (ZYRTEC) 10 MG tablet Take 10 mg by mouth daily as needed for allergies.    . Cholecalciferol (VITAMIN D3) 2000 UNITS TABS Take 2,000 Units by mouth daily at 3 pm.     . CINNAMON PO Take 350 mg by mouth daily at 3 pm.     . Coenzyme Q10 200 MG capsule Take 200 mg by mouth daily at 3 pm.     . dofetilide (TIKOSYN) 250 MCG capsule TAKE ONE CAPSULE BY MOUTH TWICE DAILY. 60 capsule 6  . Ferrous Fumarate (IRON) 18 MG TBCR Take 18 mg by mouth daily at 3 pm.     . fish oil-omega-3 fatty acids 1000 MG capsule Take 3,000 mg by mouth daily.     [name]  Glucosamine-Chondroitin (GLUCOSAMINE CHONDR COMPLEX PO) Take 1 tablet by mouth 3 (three) times a week. 1500mg /1200mg     . LUTEIN-ZEAXANTHIN PO Take 1 tablet by mouth daily at 3 pm.     . Multiple Vitamins-Minerals (MULTIVITAMIN PO) Take 1 tablet by mouth daily at 3 pm.     . olmesartan (BENICAR) 40 MG tablet TAKE ONE TABLET BY MOUTH DAILY 30 tablet 6  . PHOSPHATIDYLCHOLINE PO Take 100 mg by mouth daily at 3 pm.     . potassium chloride (K-DUR,KLOR-CON) 10 MEQ tablet TAKE ONE TABLET BY MOUTH TWICE DAILY. (Patient taking differently: Take 20 mEq by mouth daily. ) 60 tablet 6  . Probiotic Product (PROBIOTIC DAILY PO) Take 2 tablets by mouth daily at 3 pm. 5 billion  unit daily    . RESVERATROL 100 MG CAPS Take 100 mg by mouth daily at 3 pm.     . tamsulosin (FLOMAX) 0.4 MG CAPS capsule Take 0.4 mg by mouth daily as needed.   5  . XARELTO 20 MG TABS tablet TAKE ONE TABLET BY MOUTH DAILY WITH LUNCH 30 tablet 11   No current facility-administered medications for this encounter.     Allergies  Allergen Reactions  . Azithromycin     QT WAVE    Social History   Socioeconomic History  . Marital status: Married    Spouse name: Not on file  . Number of children: 3  . Years of education: Not on file  . Highest education level: Not on file  Occupational History  . Not on file  Social Needs  . Financial resource strain: Not on file  . Food insecurity    Worry: Not on file    Inability: Not on file  . Transportation needs    Medical: Not on file    Non-medical: Not on file  Tobacco Use  . Smoking status: Never Smoker  . Smokeless tobacco: Never Used  Substance and Sexual Activity  . Alcohol use: Never    Alcohol/week: 0.0 standard drinks    Frequency: Never  . Drug use: Never  . Sexual activity: Not Currently  Lifestyle  . Physical activity    Days per week: Not on file    Minutes per session: Not on file  . Stress: Not on file  Relationships  . Social Herbalist on phone: Not on file    Gets together: Not on file    Attends religious service: Not on file    Active member of club or organization: Not on file    Attends meetings of clubs or organizations: Not on file    Relationship status: Not on file  . Intimate partner violence    Fear of current or ex partner: Not on file    Emotionally abused: Not on file    Physically abused: Not on file    Forced sexual activity: Not on file  Other Topics Concern  . Not on file  Social History Narrative  . Not on file    Family History  Problem Relation Age of Onset  . Lung cancer Father   . Cancer - Lung Father   . Atrial fibrillation Mother   . Dementia Mother   .  Transient ischemic attack Mother   . Hypertension Other   . Liver cancer Brother     ROS- All systems are reviewed and negative except as per the HPI above  Physical Exam: Vitals:   01/31/19 1021  BP: (!) 142/84  Pulse: (!) 59  Weight: 125.1 kg  Height: 6\' 2"  (1.88 m)   Wt Readings from Last 3 Encounters:  01/31/19 125.1 kg  01/13/19 125.2 kg  11/03/18 124.7 kg    Labs: Lab Results  Component Value Date   NA 141 01/14/2019   K 3.8 01/14/2019   CL 108 01/14/2019   CO2 24 01/14/2019   GLUCOSE 110 (H) 01/14/2019   BUN 19 01/14/2019   CREATININE 1.05 01/14/2019   CALCIUM 8.7 (L) 01/14/2019   MG 2.1 01/14/2019   Lab Results  Component Value Date   INR 1.08 12/11/2017   No results found for: CHOL, HDL, LDLCALC, TRIG  GEN- The patient is well appearing obese male, alert and oriented x 3 today.   HEENT-head normocephalic, atraumatic, sclera clear, conjunctiva pink, hearing intact, trachea midline. Lungs- Clear to ausculation bilaterally, normal work of breathing Heart- Regular rate and rhythm, no murmurs, rubs or gallops  GI- soft, NT, ND, + BS Extremities- no clubbing, cyanosis, or edema MS- no significant deformity or atrophy Skin- no rash or lesion Psych- euthymic mood, full affect Neuro- strength and sensation are intact   EKG- NSR HR 59, PR 154, QRS 106, QTc 453  Epic records reviewed   Assessment and Plan: 1. Persistent afib  S/p DCCV on 01/14/19. Patient having more breakthrough episodes despite dofetilide. Dr Curt Bears noted during his recent hospitalization that pt may be an ablation candidate. We discussed ablation vs change in AAD today. Patient would like to consider his options and speak to Dr Lovena Le at follow up. Continue dofetilide 250 mcg bid for now. QT stable. Continue xarelto 20 mg daily Bmet/mag today Lifestyle changes as below.  This patients CHA2DS2-VASc Score and unadjusted Ischemic Stroke Rate (% per year) is equal to 2.2 % stroke  rate/year from a score of 2  Above score calculated as 1 point each if present [CHF, HTN, DM, Vascular=MI/PAD/Aortic Plaque, Age if 65-74, or Male] Above score calculated as 2 points each if present [Age > 75, or Stroke/TIA/TE]   2. HTN Stable, no changes today.   Follow up with Dr Lovena Le as scheduled.   [facility] [address] [place], [place] [address] [phone]

## 2019-02-03 ENCOUNTER — Other Ambulatory Visit: Payer: Self-pay | Admitting: Cardiovascular Disease

## 2019-02-07 ENCOUNTER — Encounter: Payer: Self-pay | Admitting: Internal Medicine

## 2019-02-07 ENCOUNTER — Other Ambulatory Visit: Payer: Self-pay

## 2019-02-07 ENCOUNTER — Ambulatory Visit (INDEPENDENT_AMBULATORY_CARE_PROVIDER_SITE_OTHER): Payer: Medicare Other | Admitting: Internal Medicine

## 2019-02-07 VITALS — BP 119/64 | HR 56 | Temp 97.3°F | Ht 74.0 in | Wt 275.0 lb

## 2019-02-07 DIAGNOSIS — I4891 Unspecified atrial fibrillation: Secondary | ICD-10-CM | POA: Diagnosis not present

## 2019-02-07 NOTE — Patient Instructions (Signed)
Medication Instructions: Your physician recommends that you continue on your current medications as directed. Please refer to the Current Medication list given to you today.  Labwork: None today  Procedures/Testing: None today  Follow-Up: Please make apt to see Dr.Camnitz at the [facility] office to discuss a-fib ablation.  Any Additional Special Instructions Will Be Listed Below (If Applicable).     If you need a refill on your cardiac medications before your next appointment, please call your pharmacy.     Thank you for choosing [facility] !

## 2019-02-07 NOTE — Progress Notes (Signed)
HPI [name] returns today for followup. He is a pleasant obese 68 yo manw with HTN and persistent atrial fib. He has been placed on dofetilide but over the past several weeks has had worsening atrial fib. He had to be cardioverted several weeks ago. He notes that he will have a couple of good days where he is mostly maintaining NSR but then will go out of rhythm. He has associated palpitations and feels poorly when in atrial fib. No syncope. No chest pain.  Allergies  Allergen Reactions  . Azithromycin     QT WAVE     Current Outpatient Medications  Medication Sig Dispense Refill  . acetaminophen (TYLENOL) 500 MG tablet Take 500-1,000 mg by mouth every 6 (six) hours as needed (for pain.).    [name] allopurinol (ZYLOPRIM) 300 MG tablet Take 300 mg by mouth daily as needed.     [name] amLODipine (NORVASC) 5 MG tablet TAKE ONE TABLET BY MOUTH DAILY. (LUNCH TIME) 90 tablet 1  . Calcium Carb-Cholecalciferol (CALCIUM + D3 PO) Take 1 tablet by mouth daily at 3 pm.     . carvedilol (COREG) 12.5 MG tablet Take 18.75 mg by mouth 2 (two) times daily with a meal.    . cetirizine (ZYRTEC) 10 MG tablet Take 10 mg by mouth daily as needed for allergies.    . Cholecalciferol (VITAMIN D3) 2000 UNITS TABS Take 2,000 Units by mouth daily at 3 pm.     . CINNAMON PO Take 350 mg by mouth daily at 3 pm.     . Coenzyme Q10 200 MG capsule Take 200 mg by mouth daily at 3 pm.     . dofetilide (TIKOSYN) 250 MCG capsule TAKE ONE CAPSULE BY MOUTH TWICE DAILY. 60 capsule 6  . Ferrous Fumarate (IRON) 18 MG TBCR Take 18 mg by mouth daily at 3 pm.     . fish oil-omega-3 fatty acids 1000 MG capsule Take 3,000 mg by mouth daily.     . Glucosamine-Chondroitin (GLUCOSAMINE CHONDR COMPLEX PO) Take 1 tablet by mouth 3 (three) times a week. 1500mg /1200mg     . LUTEIN-ZEAXANTHIN PO Take 1 tablet by mouth daily at 3 pm.     . Multiple Vitamins-Minerals (MULTIVITAMIN PO) Take 1 tablet by mouth daily at 3 pm.     . olmesartan  (BENICAR) 40 MG tablet TAKE ONE TABLET BY MOUTH DAILY 30 tablet 6  . PHOSPHATIDYLCHOLINE PO Take 100 mg by mouth daily at 3 pm.     . potassium chloride (K-DUR,KLOR-CON) 10 MEQ tablet TAKE ONE TABLET BY MOUTH TWICE DAILY. (Patient taking differently: Take 20 mEq by mouth daily. ) 60 tablet 6  . Probiotic Product (PROBIOTIC DAILY PO) Take 2 tablets by mouth daily at 3 pm. 5 billion unit daily    . RESVERATROL 100 MG CAPS Take 100 mg by mouth daily at 3 pm.     . tamsulosin (FLOMAX) 0.4 MG CAPS capsule Take 0.4 mg by mouth daily as needed.   5  . XARELTO 20 MG TABS tablet TAKE ONE TABLET BY MOUTH DAILY WITH LUNCH 30 tablet 11   No current facility-administered medications for this visit.      Past Medical History:  Diagnosis Date  . Arthritis    "knuckles maybe" (02/28/2018)  . Atrial fib/flutter, transient    a. s/p TEE-guided DCCV on 12/14/2017 with return to NSR.   [name] BPH (benign prostatic hyperplasia) 12/11/2017  . Essential hypertension   . Gout    "  I take RX qd" (02/28/2018)  . Neuropathy   . Pneumonia 11/2017  . Thyroid nodule    Benign    ROS:   All systems reviewed and negative except as noted in the HPI.   Past Surgical History:  Procedure Laterality Date  . BACK SURGERY    . BIOPSY THYROID    . CARDIOVERSION N/A 12/14/2017   Procedure: CARDIOVERSION;  Surgeon: Satira Sark, MD;  Location: [facility];  Service: Cardiovascular;  Laterality: N/A;  . CARDIOVERSION N/A 01/14/2019   Procedure: CARDIOVERSION;  Surgeon: Constance Haw, MD;  Location: [facility];  Service: Cardiovascular;  Laterality: N/A;  . CATARACT EXTRACTION W/ INTRAOCULAR LENS  IMPLANT, BILATERAL Bilateral 2006-2010   "right-left"  . COLONOSCOPY  2012  . IRRIGATION AND DEBRIDEMENT SEBACEOUS CYST    . LUMBAR LAMINECTOMY N/A 01/20/2013   Procedure: MICRODISCECTOMY LUMBAR LAMINECTOMY;  Surgeon: Marybelle Killings, MD;  Location: [facility];  Service: Orthopedics;  Laterality: N/A;  L4-5 Decompression  .  PILONIDAL CYST DRAINAGE    . TEE WITHOUT CARDIOVERSION N/A 12/14/2017   Procedure: TRANSESOPHAGEAL ECHOCARDIOGRAM (TEE) WITH PROPOFOL;  Surgeon: Satira Sark, MD;  Location: [facility];  Service: Cardiovascular;  Laterality: N/A;     Family History  Problem Relation Age of Onset  . Lung cancer Father   . Cancer - Lung Father   . Atrial fibrillation Mother   . Dementia Mother   . Transient ischemic attack Mother   . Hypertension Other   . Liver cancer Brother      Social History   Socioeconomic History  . Marital status: Married    Spouse name: Not on file  . Number of children: 3  . Years of education: Not on file  . Highest education level: Not on file  Occupational History  . Not on file  Social Needs  . Financial resource strain: Not on file  . Food insecurity    Worry: Not on file    Inability: Not on file  . Transportation needs    Medical: Not on file    Non-medical: Not on file  Tobacco Use  . Smoking status: Never Smoker  . Smokeless tobacco: Never Used  Substance and Sexual Activity  . Alcohol use: Never    Alcohol/week: 0.0 standard drinks    Frequency: Never  . Drug use: Never  . Sexual activity: Not Currently  Lifestyle  . Physical activity    Days per week: Not on file    Minutes per session: Not on file  . Stress: Not on file  Relationships  . Social Herbalist on phone: Not on file    Gets together: Not on file    Attends religious service: Not on file    Active member of club or organization: Not on file    Attends meetings of clubs or organizations: Not on file    Relationship status: Not on file  . Intimate partner violence    Fear of current or ex partner: Not on file    Emotionally abused: Not on file    Physically abused: Not on file    Forced sexual activity: Not on file  Other Topics Concern  . Not on file  Social History Narrative  . Not on file     BP 119/64   Pulse (!) 56   Temp (!) 97.3 F (36.3 C)  (Temporal)   Ht 6\' 2"  (1.88 m)   Wt 275 lb (124.7 kg)  SpO2 98%   BMI 35.31 kg/m   Physical Exam:  obese appearing NAD HEENT: Unremarkable Neck:  No JVD, no thyromegally Lymphatics:  No adenopathy Back:  No CVA tenderness Lungs:  Clear with no wheezes. HEART:  Regular rate rhythm, no murmurs, no rubs, no clicks Abd:  soft, positive bowel sounds, no organomegally, no rebound, no guarding Ext:  2 plus pulses, no edema, no cyanosis, no clubbing Skin:  No rashes no nodules Neuro:  CN II through XII intact, motor grossly intact  EKG - nsr  Assess/Plan: 1. Persistent atrial fib - he has continued to have symptomatic break through episodes and feels poorly. I discussed the various treatment options from amiodarone, catheter ablation as well as rate control. His LA dimenstion a year ago was 52 mm.  He would like to pursue catheter ablation. He will continue the dofetilide. I will refer him to see Dr. Carlyn Reichert. 2. Obesity - I encouraged him to lose weight and reminded him that the likelihood of success in catheter ablation was related to weight loss. 3. HTN - his bp is well controlled. No change in his meds.  I spent over 25 minutes including over 50% face to face time with the patient.  Mikle Bosworth.D.

## 2019-02-08 DIAGNOSIS — Z23 Encounter for immunization: Secondary | ICD-10-CM | POA: Diagnosis not present

## 2019-02-15 DIAGNOSIS — I10 Essential (primary) hypertension: Secondary | ICD-10-CM | POA: Diagnosis not present

## 2019-02-23 ENCOUNTER — Other Ambulatory Visit: Payer: Self-pay

## 2019-02-23 ENCOUNTER — Encounter (HOSPITAL_COMMUNITY): Payer: Self-pay | Admitting: Nurse Practitioner

## 2019-02-23 ENCOUNTER — Ambulatory Visit (HOSPITAL_COMMUNITY)
Admission: RE | Admit: 2019-02-23 | Discharge: 2019-02-23 | Disposition: A | Discharge status: Home / Self Care | Payer: Medicare Other | Source: Ambulatory Visit | Attending: Nurse Practitioner | Admitting: Nurse Practitioner

## 2019-02-23 VITALS — BP 140/82 | HR 59 | Ht 74.0 in | Wt 274.0 lb

## 2019-02-23 DIAGNOSIS — R001 Bradycardia, unspecified: Secondary | ICD-10-CM | POA: Diagnosis not present

## 2019-02-23 DIAGNOSIS — Z79899 Other long term (current) drug therapy: Secondary | ICD-10-CM | POA: Insufficient documentation

## 2019-02-23 DIAGNOSIS — I480 Paroxysmal atrial fibrillation: Secondary | ICD-10-CM | POA: Diagnosis not present

## 2019-02-23 DIAGNOSIS — M109 Gout, unspecified: Secondary | ICD-10-CM | POA: Insufficient documentation

## 2019-02-23 DIAGNOSIS — Z7901 Long term (current) use of anticoagulants: Secondary | ICD-10-CM | POA: Insufficient documentation

## 2019-02-23 NOTE — Progress Notes (Signed)
Primary Care Physician: Glenda Chroman, MD Referring Physician: Self referred  EP: Dr. Nunzio Cory is a 68 y.o. male with a h/o paroxysmal afib that is in the afib clinic as he noted some heart irregularity over the weekend. He described this to Dr. Lovena Le as well when he was seen mid September and he suggested change to amiodarone or ablation. His left atrium is 52 and pt is leary of the side effects of amiodarone. He has been on Tikosyn for over a year.   However, talking to the pt, I am not sure what he is feeling is afib. He describes a slow pounding at nighttime that is worrisome to him. He also will notice short spells of regular rhythm with pauses, possibly premature contractions. He does not not describe any prolonged spells of irregular rhythm. His wife states that he is very anxious and will worry if he feels anything out of the normal with  his heart rhythm.   Today, he denies symptoms of palpitations, chest pain, shortness of breath, orthopnea, PND, lower extremity edema, dizziness, presyncope, syncope, or neurologic sequela. The patient is tolerating medications without difficulties and is otherwise without complaint today.   Past Medical History:  Diagnosis Date  . Arthritis    "knuckles maybe" (02/28/2018)  . Atrial fib/flutter, transient    a. s/p TEE-guided DCCV on 12/14/2017 with return to NSR.   [name] BPH (benign prostatic hyperplasia) 12/11/2017  . Essential hypertension   . Gout    "I take RX qd" (02/28/2018)  . Neuropathy   . Pneumonia 11/2017  . Thyroid nodule    Benign   Past Surgical History:  Procedure Laterality Date  . BACK SURGERY    . BIOPSY THYROID    . CARDIOVERSION N/A 12/14/2017   Procedure: CARDIOVERSION;  Surgeon: Satira Sark, MD;  Location: [facility];  Service: Cardiovascular;  Laterality: N/A;  . CARDIOVERSION N/A 01/14/2019   Procedure: CARDIOVERSION;  Surgeon: Constance Haw, MD;  Location: [facility];  Service:  Cardiovascular;  Laterality: N/A;  . CATARACT EXTRACTION W/ INTRAOCULAR LENS  IMPLANT, BILATERAL Bilateral 2006-2010   "right-left"  . COLONOSCOPY  2012  . IRRIGATION AND DEBRIDEMENT SEBACEOUS CYST    . LUMBAR LAMINECTOMY N/A 01/20/2013   Procedure: MICRODISCECTOMY LUMBAR LAMINECTOMY;  Surgeon: Marybelle Killings, MD;  Location: [facility];  Service: Orthopedics;  Laterality: N/A;  L4-5 Decompression  . PILONIDAL CYST DRAINAGE    . TEE WITHOUT CARDIOVERSION N/A 12/14/2017   Procedure: TRANSESOPHAGEAL ECHOCARDIOGRAM (TEE) WITH PROPOFOL;  Surgeon: Satira Sark, MD;  Location: [facility];  Service: Cardiovascular;  Laterality: N/A;    Current Outpatient Medications  Medication Sig Dispense Refill  . acetaminophen (TYLENOL) 500 MG tablet Take 500-1,000 mg by mouth every 6 (six) hours as needed (for pain.).    [name] allopurinol (ZYLOPRIM) 300 MG tablet Take 300 mg by mouth daily as needed.     [name] amLODipine (NORVASC) 5 MG tablet TAKE ONE TABLET BY MOUTH DAILY. (LUNCH TIME) 90 tablet 1  . Calcium Carb-Cholecalciferol (CALCIUM + D3 PO) Take 1 tablet by mouth daily at 3 pm.     . carvedilol (COREG) 12.5 MG tablet Take 18.75 mg by mouth 2 (two) times daily with a meal.    . cetirizine (ZYRTEC) 10 MG tablet Take 10 mg by mouth daily as needed for allergies.    . Cholecalciferol (VITAMIN D3) 2000 UNITS TABS Take 2,000 Units by mouth daily at 3 pm.     .  CINNAMON PO Take 350 mg by mouth daily at 3 pm.     . Coenzyme Q10 200 MG capsule Take 200 mg by mouth daily at 3 pm.     . dofetilide (TIKOSYN) 250 MCG capsule TAKE ONE CAPSULE BY MOUTH TWICE DAILY. 60 capsule 6  . Ferrous Fumarate (IRON) 18 MG TBCR Take 18 mg by mouth daily at 3 pm.     . fish oil-omega-3 fatty acids 1000 MG capsule Take 3,000 mg by mouth daily.     . Glucosamine-Chondroitin (GLUCOSAMINE CHONDR COMPLEX PO) Take 1 tablet by mouth 3 (three) times a week. 1500mg /1200mg     . LUTEIN-ZEAXANTHIN PO Take 1 tablet by mouth daily at 3 pm.     . Multiple  Vitamins-Minerals (MULTIVITAMIN PO) Take 1 tablet by mouth daily at 3 pm.     . olmesartan (BENICAR) 40 MG tablet TAKE ONE TABLET BY MOUTH DAILY 30 tablet 6  . PHOSPHATIDYLCHOLINE PO Take 100 mg by mouth daily at 3 pm.     . potassium chloride (K-DUR,KLOR-CON) 10 MEQ tablet TAKE ONE TABLET BY MOUTH TWICE DAILY. (Patient taking differently: Take 20 mEq by mouth daily. ) 60 tablet 6  . Probiotic Product (PROBIOTIC DAILY PO) Take 2 tablets by mouth daily at 3 pm. 5 billion unit daily    . RESVERATROL 100 MG CAPS Take 100 mg by mouth daily at 3 pm.     . tamsulosin (FLOMAX) 0.4 MG CAPS capsule Take 0.4 mg by mouth daily as needed.   5  . XARELTO 20 MG TABS tablet TAKE ONE TABLET BY MOUTH DAILY WITH LUNCH 30 tablet 11   No current facility-administered medications for this encounter.     Allergies  Allergen Reactions  . Azithromycin     QT WAVE    Social History   Socioeconomic History  . Marital status: Married    Spouse name: Not on file  . Number of children: 3  . Years of education: Not on file  . Highest education level: Not on file  Occupational History  . Not on file  Social Needs  . Financial resource strain: Not on file  . Food insecurity    Worry: Not on file    Inability: Not on file  . Transportation needs    Medical: Not on file    Non-medical: Not on file  Tobacco Use  . Smoking status: Never Smoker  . Smokeless tobacco: Never Used  Substance and Sexual Activity  . Alcohol use: Never    Alcohol/week: 0.0 standard drinks    Frequency: Never  . Drug use: Never  . Sexual activity: Not Currently  Lifestyle  . Physical activity    Days per week: Not on file    Minutes per session: Not on file  . Stress: Not on file  Relationships  . Social Herbalist on phone: Not on file    Gets together: Not on file    Attends religious service: Not on file    Active member of club or organization: Not on file    Attends meetings of clubs or organizations: Not  on file    Relationship status: Not on file  . Intimate partner violence    Fear of current or ex partner: Not on file    Emotionally abused: Not on file    Physically abused: Not on file    Forced sexual activity: Not on file  Other Topics Concern  . Not on file  Social History Narrative  . Not on file    Family History  Problem Relation Age of Onset  . Lung cancer Father   . Cancer - Lung Father   . Atrial fibrillation Mother   . Dementia Mother   . Transient ischemic attack Mother   . Hypertension Other   . Liver cancer Brother     ROS- All systems are reviewed and negative except as per the HPI above  Physical Exam: Vitals:   02/23/19 0907  BP: 140/82  Pulse: (!) 59  Weight: 124.3 kg  Height: 6\' 2"  (1.88 m)   Wt Readings from Last 3 Encounters:  02/23/19 124.3 kg  02/07/19 124.7 kg  01/31/19 125.1 kg    Labs: Lab Results  Component Value Date   NA 141 01/31/2019   K 3.8 01/31/2019   CL 108 01/31/2019   CO2 24 01/31/2019   GLUCOSE 110 (H) 01/31/2019   BUN 17 01/31/2019   CREATININE 0.77 01/31/2019   CALCIUM 8.6 (L) 01/31/2019   MG 2.0 01/31/2019   Lab Results  Component Value Date   INR 1.08 12/11/2017   No results found for: CHOL, HDL, LDLCALC, TRIG   GEN- The patient is well appearing, alert and oriented x 3 today.   Head- normocephalic, atraumatic Eyes-  Sclera clear, conjunctiva pink Ears- hearing intact Oropharynx- clear Neck- supple, no JVP Lymph- no cervical lymphadenopathy Lungs- Clear to ausculation bilaterally, normal work of breathing Heart- Regular rate and rhythm, no murmurs, rubs or gallops, PMI not laterally displaced GI- soft, NT, ND, + BS Extremities- no clubbing, cyanosis, or edema MS- no significant deformity or atrophy Skin- no rash or lesion Psych- euthymic mood, full affect Neuro- strength and sensation are intact  EKG-sinus brady at 59 bpm, pr int 168 ms, qrs int 104 ms, qtc 451  Echo-12/12/17- Study Conclusions   - Left ventricle: EF difficult to estimate due to presence of   atrial fibrillation and variable heart rate. Appears mildly   depressed. The cavity size was normal. Wall thickness was   increased in a pattern of mild LVH. Systolic function was mildly   reduced. The estimated ejection fraction was in the range of 45%   to 50%. Wall motion was normal; there were no regional wall   motion abnormalities. - Left atrium: The atrium was moderately dilated.( 52 mm, LA volume 86) - Pulmonary arteries: Systolic pressure was mildly increased. PA   peak pressure: 32 mm Hg (S).  Assessment and Plan: 1.Paroxysmal Afib Felt some pounding over the weekend and wanted  to be seen today He is in Sinus brady  Has an appointment with Dr. Curt Bears 10/5 to discuss ablation I am concerned that his left atrial size of 52 may not make him an optimum candidate I am also not convinced by his description of what he was feeling is actually afib He currently does not have a means of tracking this at home  He may benefit from a wearable or  a LINQ to help differentiate to see if his burden warrants an ablation Continue dofetilide 250  meq bid Continue  carvedilol 12.5 mg 1 1/2 tab bid  Continue xarelto for a CHA2DS2VASc score of 4  I do not feel 30 mg cardizem will help his as he does not describe a rapid heart rhythm   F/u with Dr. Curt Bears 10/4  Geroge Baseman. Jillann Charette, [facility] [address] [place], [place] [address] [phone]

## 2019-02-24 DIAGNOSIS — I10 Essential (primary) hypertension: Secondary | ICD-10-CM | POA: Diagnosis not present

## 2019-02-27 ENCOUNTER — Encounter: Payer: Self-pay | Admitting: Cardiology

## 2019-02-27 ENCOUNTER — Ambulatory Visit (INDEPENDENT_AMBULATORY_CARE_PROVIDER_SITE_OTHER): Payer: Medicare Other | Admitting: Cardiology

## 2019-02-27 ENCOUNTER — Other Ambulatory Visit: Payer: Self-pay

## 2019-02-27 ENCOUNTER — Telehealth: Payer: Self-pay

## 2019-02-27 VITALS — BP 144/76 | HR 61 | Ht 74.0 in | Wt 275.9 lb

## 2019-02-27 DIAGNOSIS — I4819 Other persistent atrial fibrillation: Secondary | ICD-10-CM

## 2019-02-27 NOTE — Progress Notes (Signed)
Electrophysiology Office Note   Date:  02/27/2019   ID:  ELFEGO SEDOR, DOB [birth year], MRN [place]:[id number]  PCP:  Glenda Chroman, MD  Cardiologist:  Lovena Le Primary Electrophysiologist:  Makyah Lavigne Meredith Leeds, MD    Chief Complaint: AF   History of Present Illness: ROLLYN HOWERY is a 68 y.o. male who is being seen today for the evaluation of AF at the request of [name]. Presenting today for electrophysiology evaluation.  He has a history of atrial fibrillation/atrial flutter, status post recent cardioversion, hypertension.  He has been on Tikosyn for over a year.  He is unfortunately had more atrial fibrillation and was suggested to try either amiodarone or ablation.  He has been having slow pounding at night that has been worrisome.  He also has noted short spells of regular rhythm with pauses.  He does not describe prolonged spells of irregular rhythm.  Today, he denies symptoms of palpitations, chest pain, shortness of breath, orthopnea, PND, lower extremity edema, claudication, dizziness, presyncope, syncope, bleeding, or neurologic sequela. The patient is tolerating medications without difficulties.    Past Medical History:  Diagnosis Date  . Arthritis    "knuckles maybe" (02/28/2018)  . Atrial fib/flutter, transient    a. s/p TEE-guided DCCV on 12/14/2017 with return to NSR.   [name] BPH (benign prostatic hyperplasia) 12/11/2017  . Essential hypertension   . Gout    "I take RX qd" (02/28/2018)  . Neuropathy   . Pneumonia 11/2017  . Thyroid nodule    Benign   Past Surgical History:  Procedure Laterality Date  . BACK SURGERY    . BIOPSY THYROID    . CARDIOVERSION N/A 12/14/2017   Procedure: CARDIOVERSION;  Surgeon: Satira Sark, MD;  Location: [facility];  Service: Cardiovascular;  Laterality: N/A;  . CARDIOVERSION N/A 01/14/2019   Procedure: CARDIOVERSION;  Surgeon: Constance Haw, MD;  Location: [place];  Service: Cardiovascular;  Laterality: N/A;  .  CATARACT EXTRACTION W/ INTRAOCULAR LENS  IMPLANT, BILATERAL Bilateral 2006-2010   "right-left"  . COLONOSCOPY  2012  . IRRIGATION AND DEBRIDEMENT SEBACEOUS CYST    . LUMBAR LAMINECTOMY N/A 01/20/2013   Procedure: MICRODISCECTOMY LUMBAR LAMINECTOMY;  Surgeon: Marybelle Killings, MD;  Location: [place];  Service: Orthopedics;  Laterality: N/A;  L4-5 Decompression  . PILONIDAL CYST DRAINAGE    . TEE WITHOUT CARDIOVERSION N/A 12/14/2017   Procedure: TRANSESOPHAGEAL ECHOCARDIOGRAM (TEE) WITH PROPOFOL;  Surgeon: Satira Sark, MD;  Location: [facility];  Service: Cardiovascular;  Laterality: N/A;     Current Outpatient Medications  Medication Sig Dispense Refill  . acetaminophen (TYLENOL) 500 MG tablet Take 500-1,000 mg by mouth every 6 (six) hours as needed (for pain.).    [name] allopurinol (ZYLOPRIM) 300 MG tablet Take 300 mg by mouth daily as needed.     [name] amLODipine (NORVASC) 5 MG tablet TAKE ONE TABLET BY MOUTH DAILY. (LUNCH TIME) 90 tablet 1  . Calcium Carb-Cholecalciferol (CALCIUM + D3 PO) Take 1 tablet by mouth daily at 3 pm.     . carvedilol (COREG) 12.5 MG tablet Take 18.75 mg by mouth 2 (two) times daily with a meal.    . cetirizine (ZYRTEC) 10 MG tablet Take 10 mg by mouth daily as needed for allergies.    . Cholecalciferol (VITAMIN D3) 2000 UNITS TABS Take 2,000 Units by mouth daily at 3 pm.     . CINNAMON PO Take 350 mg by mouth daily at 3 pm.     .  Coenzyme Q10 200 MG capsule Take 200 mg by mouth daily at 3 pm.     . dofetilide (TIKOSYN) 250 MCG capsule TAKE ONE CAPSULE BY MOUTH TWICE DAILY. 60 capsule 6  . Ferrous Fumarate (IRON) 18 MG TBCR Take 18 mg by mouth daily at 3 pm.     . fish oil-omega-3 fatty acids 1000 MG capsule Take 3,000 mg by mouth daily.     . Glucosamine-Chondroitin (GLUCOSAMINE CHONDR COMPLEX PO) Take 1 tablet by mouth 3 (three) times a week. 1500mg /1200mg     . LUTEIN-ZEAXANTHIN PO Take 1 tablet by mouth daily at 3 pm.     . Multiple Vitamins-Minerals (MULTIVITAMIN PO)  Take 1 tablet by mouth daily at 3 pm.     . olmesartan (BENICAR) 40 MG tablet TAKE ONE TABLET BY MOUTH DAILY 30 tablet 6  . PHOSPHATIDYLCHOLINE PO Take 100 mg by mouth daily at 3 pm.     . potassium chloride (K-DUR,KLOR-CON) 10 MEQ tablet TAKE ONE TABLET BY MOUTH TWICE DAILY. 60 tablet 6  . Probiotic Product (PROBIOTIC DAILY PO) Take 2 tablets by mouth daily at 3 pm. 5 billion unit daily    . RESVERATROL 100 MG CAPS Take 100 mg by mouth daily at 3 pm.     . tamsulosin (FLOMAX) 0.4 MG CAPS capsule Take 0.4 mg by mouth daily as needed.   5  . XARELTO 20 MG TABS tablet TAKE ONE TABLET BY MOUTH DAILY WITH LUNCH 30 tablet 11   No current facility-administered medications for this visit.     Allergies:   Azithromycin   Social History:  The patient  reports that he has never smoked. He has never used smokeless tobacco. He reports that he does not drink alcohol or use drugs.   Family History:  The patient's family history includes Atrial fibrillation in his mother; Cancer - Lung in his father; Dementia in his mother; Hypertension in an other family member; Liver cancer in his brother; Lung cancer in his father; Transient ischemic attack in his mother.    ROS:  Please see the history of present illness.   Otherwise, review of systems is positive for none.   All other systems are reviewed and negative.    PHYSICAL EXAM: VS:  BP (!) 144/76   Pulse 61   Ht 6\' 2"  (1.88 m)   Wt 275 lb 14.4 oz (125.1 kg)   SpO2 98%   BMI 35.42 kg/m  , BMI Body mass index is 35.42 kg/m. GEN: Well nourished, well developed, in no acute distress  HEENT: normal  Neck: no JVD, carotid bruits, or masses Cardiac: RRR; no murmurs, rubs, or gallops,no edema  Respiratory:  clear to auscultation bilaterally, normal work of breathing GI: soft, nontender, nondistended, + BS MS: no deformity or atrophy  Skin: warm and dry Neuro:  Strength and sensation are intact Psych: euthymic mood, full affect  EKG:  EKG is ordered  today. Personal review of the ekg ordered shows sinus rhythm, rate 61  Recent Labs: 01/13/2019: Hemoglobin 14.5; Platelets 140 01/14/2019: ALT 19; B Natriuretic Peptide 312.2; TSH 4.233 01/31/2019: BUN 17; Creatinine, Ser 0.77; Magnesium 2.0; Potassium 3.8; Sodium 141    Lipid Panel  No results found for: CHOL, TRIG, HDL, CHOLHDL, VLDL, LDLCALC, LDLDIRECT   Wt Readings from Last 3 Encounters:  02/27/19 275 lb 14.4 oz (125.1 kg)  02/23/19 274 lb (124.3 kg)  02/07/19 275 lb (124.7 kg)      Other studies Reviewed: Additional studies/ records that  were reviewed today include: TTE 12/13/18  Review of the above records today demonstrates:  - Left ventricle: EF difficult to estimate due to presence of   atrial fibrillation and variable heart rate. Appears mildly   depressed. The cavity size was normal. Wall thickness was   increased in a pattern of mild LVH. Systolic function was mildly   reduced. The estimated ejection fraction was in the range of 45%   to 50%. Wall motion was normal; there were no regional wall   motion abnormalities. - Left atrium: The atrium was moderately dilated. - Pulmonary arteries: Systolic pressure was mildly increased. PA   peak pressure: 32 mm Hg (S).   ASSESSMENT AND PLAN:  1.  Persistent atrial fibrillation/flutter: Currently on dofetilide and Xarelto.  His left atrium is moderately dilated.  I offered him continued therapy with dofetilide, switching to amiodarone versus ablation.  Risks and benefits of ablation include bleeding, tamponade, heart block, stroke, damage surrounding organs.  He understands these risks and has agreed to the procedure.  In the interim, due to the pounding that he feels in his chest, we Rayonna Heldman have him wear a two-week monitor.  It does not sound that this is due to atrial fibrillation.  This patients CHA2DS2-VASc Score and unadjusted Ischemic Stroke Rate (% per year) is equal to 2.2 % stroke rate/year from a score of 2  Above  score calculated as 1 point each if present [CHF, HTN, DM, Vascular=MI/PAD/Aortic Plaque, Age if 65-74, or Male] Above score calculated as 2 points each if present [Age > 75, or Stroke/TIA/TE]  2.  Hypertension: Mildly elevated today.  Been normal on prior checks.  No changes.  Plan discussed with primary cardiology  Current medicines are reviewed at length with the patient today.   The patient does not have concerns regarding his medicines.  The following changes were made today:  none  Labs/ tests ordered today include:  Orders Placed This Encounter  Procedures  . LONG TERM MONITOR (3-14 DAYS)  . EKG 12-Lead     Disposition:   FU with Merlyn Conley 3 months  Signed, Aaria Happ Meredith Leeds, MD  02/27/2019 12:02 PM     [place] [address] [place] [address] [phone] (office) [phone] (fax)

## 2019-02-27 NOTE — Telephone Encounter (Signed)
Went over monitor instructions with pt during his visit with Dr Curt Bears. Registered monitor. Pt will place monitor tomorrow morning.

## 2019-02-27 NOTE — Patient Instructions (Signed)
Medication Instructions:  Your physician recommends that you continue on your current medications as directed. Please refer to the Current Medication list given to you today.  *If you need a refill on your cardiac medications before your next appointment, please call your pharmacy.  Labwork: You will get lab work within 30 days of your procedure:  BMP & CBC Please complete lab work on 03/20/19. Stop by the [employer] office to complete these.  If you have labs (blood work) drawn today and your tests are completely normal, you will receive your results only by:  [place] (if you have MyChart) OR  A paper copy in the mail If you have any lab test that is abnormal or we need to change your treatment, we will call you to review the results.  Testing/Procedures: Your physician has recommended that you wear a 2 week event monitor. Event monitors are medical devices that record the heart's electrical activity. Doctors most often [place] these monitors to diagnose arrhythmias. Arrhythmias are problems with the speed or rhythm of the heartbeat. The monitor is a small, portable device. You can wear one while you do your normal daily activities. This is usually used to diagnose what is causing palpitations/syncope (passing out).  Your physician has requested that you have cardiac CT within 7 days prior to your ablation. Cardiac computed tomography (CT) is a painless test that uses an x-ray machine to take clear, detailed pictures of your heart. For further information please visit [employer]. Please follow instruction below located under special instructions. You will get a call from our office to schedule the date for this test.  Your physician has recommended that you have an ablation. Catheter ablation is a medical procedure used to treat some cardiac arrhythmias (irregular heartbeats). During catheter ablation, a long, thin, flexible tube is put into a blood vessel in your groin (upper  thigh), or neck. This tube is called an ablation catheter. It is then guided to your heart through the blood vessel. Radio frequency waves destroy small areas of heart tissue where abnormal heartbeats may cause an arrhythmia to start. Please see the instructions below located under special instructions  Follow-Up: You are scheduled for a virtual visit on 03/28/19 @ 8:30 am for H&P prior to procedure.   Your physician recommends that you follow up with [name] NP in the AFib clinic on 05/08/19 @ 8:30 am .  Your physician recommends that you schedule a follow up appointment in: 3 months, after your procedure on 04/05/19, with Dr. Curt Bears.  You will get a letter to remind you to call into office to make this appointment.  *Please note that any paperwork needing to be filled out by the provider will need to be addressed at the front desk prior to seeing the provider. Please note that any FMLA, disability or other documents regarding health condition is subject to a $25.00 charge that must be received prior to completion of paperwork in the form of a money order or check.  Thank you for choosing [facility]!! Trinidad Curet, RN [phone]   Any Other Special Instructions Will Be Listed Below    CT INSTRUCTIONS Your cardiac CT will be scheduled at:  [facility] [address] [place], [place] [address] [phone]  Please arrive at the [facility] main entrance of [facility] at _____________,  30-45 minutes prior to test start time. Proceed to the [facility] Radiology Department (first floor) to check-in and test prep.  Please follow these instructions carefully (unless otherwise directed):  Hold all erectile dysfunction medications at least 48 hours prior to test.  On the Night Before the Test: . Be sure to Drink plenty of water. . Do not consume any caffeinated/decaffeinated beverages or chocolate 12 hours prior to your test. . Do not take any  antihistamines 12 hours prior to your test. . If you take Metformin do not take 24 hours prior to test. . If the patient has contrast allergy: ? Patient will need a prescription for Prednisone and very clear instructions (as follows): 1. Prednisone 50 mg - take 13 hours prior to test 2. Take another Prednisone 50 mg 7 hours prior to test 3. Take another Prednisone 50 mg 1 hour prior to test 4. Take Benadryl 50 mg 1 hour prior to test . Patient must complete all four doses of above prophylactic medications. . Patient will need a ride after test due to Benadryl.  On the Day of the Test: . Drink plenty of water. Do not drink any water within one hour of the test. . Do not eat any food 4 hours prior to the test. . You may take your regular medications prior to the test.  . Take metoprolol (Lopressor) two hours prior to test. . HOLD Furosemide/Hydrochlorothiazide morning of the test. . FEMALES- please wear underwire-free bra if available       After the Test: . Drink plenty of water. . After receiving IV contrast, you may experience a mild flushed feeling. This is normal. . On occasion, you may experience a mild rash up to 24 hours after the test. This is not dangerous. If this occurs, you can take Benadryl 25 mg and increase your fluid intake. . If you experience trouble breathing, this can be serious. If it is severe call 911 IMMEDIATELY. If it is mild, please call our office. . If you take any of these medications: Glipizide/Metformin, Avandament, Glucavance, please do not take 48 hours after completing test.    Please contact the cardiac imaging nurse navigator should you have any questions/concerns Marchia Bond, RN Navigator Cardiac [place] [facility] [phone] Office  [phone] Cell       Electrophysiology/Ablation Procedure Instructions   You are scheduled for a(n) Atrial fibrillation ablation on 04/05/2019 with Dr. Allegra Lai.   1.    Pre procedure testing-             A.  LAB WORK --- On 03/20/19 for your pre procedure blood work.                 B. COVID TEST-- On 04/01/19 @ 12:00 pm - You will go to [facility] ([place]) for your Covid testing.   This is a drive thru test site.  There will be multiple testing areas.  Be sure to share with the first checkpoint that you are there for pre-procedure/surgery testing. This will put you into the right (yellow) lane that leads to the PAT testing team. Stay in your car and the nurse team will come to your car to test you.  After you are tested please go home and self quarantine until the day of your procedure.     2. On the day of your procedure 04/05/2019 you will go to [facility] [phone] [address]) at 6:30 am.  Dennis Bast will go to the main entrance A [employer]) and enter where the [employer] are.  Your driver will drop you off and you will head down the hallway to ADMITTING.  You may have one support person come in to the hospital with you.  They will be asked to wait in the waiting room.   3.   Do not eat or drink after midnight prior to your procedure.   4.   Do NOT take any medications the morning of your procedure.   5.  Plan for an overnight stay.  If you use your phone frequently bring your phone charger.   6. You will follow up with the AFIB clinic 4 weeks after your procedure.  You will follow up with Dr. Curt Bears  3 months after your procedure.  These appointments will be made for you.   * If you have ANY questions please call the office [phone] and ask for Dhruva Orndoff RN or send me a MyChart message   * Occasionally, EP Studies and ablations can become lengthy.  Please make your family aware of this before your procedure starts.  Average time ranges from 2-8 hours for EP studies/ablations.  Your physician will call your family after the procedure with the results.         Cardiac Ablation Cardiac ablation is a procedure to  disable (ablate) a small amount of heart tissue in very specific places. The heart has many electrical connections. Sometimes these connections are abnormal and can cause the heart to beat very fast or irregularly. Ablating some of the problem areas can improve the heart rhythm or return it to normal. Ablation may be done for people who:  Have Wolff-Parkinson-White syndrome.  Have fast heart rhythms (tachycardia).  Have taken medicines for an abnormal heart rhythm (arrhythmia) that were not effective or caused side effects.  Have a high-risk heartbeat that may be life-threatening. During the procedure, a small incision is made in the neck or the groin, and a long, thin, flexible tube (catheter) is inserted into the incision and moved to the heart. Small devices (electrodes) on the tip of the catheter will send out electrical currents. A type of X-ray (fluoroscopy) will be used to help guide the catheter and to provide images of the heart. Tell a health care provider about:  Any allergies you have.  All medicines you are taking, including vitamins, herbs, eye drops, creams, and over-the-counter medicines.  Any problems you or family members have had with anesthetic medicines.  Any blood disorders you have.  Any surgeries you have had.  Any medical conditions you have, such as kidney failure.  Whether you are pregnant or may be pregnant. What are the risks? Generally, this is a safe procedure. However, problems may occur, including:  Infection.  Bruising and bleeding at the catheter insertion site.  Bleeding into the chest, especially into the sac that surrounds the heart. This is a serious complication.  Stroke or blood clots.  Damage to other structures or organs.  Allergic reaction to medicines or dyes.  Need for a permanent pacemaker if the normal electrical system is damaged. A pacemaker is a small computer that sends electrical signals to the heart and helps your heart  beat normally.  The procedure not being fully effective. This may not be recognized until months later. Repeat ablation procedures are sometimes required. What happens before the procedure?  Follow instructions from your health care provider about eating or drinking restrictions.  Ask your health care provider about: ? Changing or stopping your regular medicines. This is especially important if you  are taking diabetes medicines or blood thinners. ? Taking medicines such as aspirin and ibuprofen. These medicines can thin your blood. Do not take these medicines before your procedure if your health care provider instructs you not to.  Plan to have someone take you home from the hospital or clinic.  If you will be going home right after the procedure, plan to have someone with you for 24 hours. What happens during the procedure?  To lower your risk of infection: ? Your health care team will wash or sanitize their hands. ? Your skin will be washed with soap. ? Hair may be removed from the incision area.  An IV tube will be inserted into one of your veins.  You will be given a medicine to help you relax (sedative).  The skin on your neck or groin will be numbed.  An incision will be made in your neck or your groin.  A needle will be inserted through the incision and into a large vein in your neck or groin.  A catheter will be inserted into the needle and moved to your heart.  Dye may be injected through the catheter to help your surgeon see the area of the heart that needs treatment.  Electrical currents will be sent from the catheter to ablate heart tissue in desired areas. There are three types of energy that may be used to ablate heart tissue: ? Heat (radiofrequency energy). ? Laser energy. ? Extreme cold (cryoablation).  When the necessary tissue has been ablated, the catheter will be removed.  Pressure will be held on the catheter insertion area to prevent excessive bleeding.   A bandage (dressing) will be placed over the catheter insertion area. The procedure may vary among health care providers and hospitals. What happens after the procedure?  Your blood pressure, heart rate, breathing rate, and blood oxygen level will be monitored until the medicines you were given have worn off.  Your catheter insertion area will be monitored for bleeding. You will need to lie still for a few hours to ensure that you do not bleed from the catheter insertion area.  Do not drive for 24 hours or as long as directed by your health care provider. Summary  Cardiac ablation is a procedure to disable (ablate) a small amount of heart tissue in very specific places. Ablating some of the problem areas can improve the heart rhythm or return it to normal.  During the procedure, electrical currents will be sent from the catheter to ablate heart tissue in desired areas. This information is not intended to replace advice given to you by your health care provider. Make sure you discuss any questions you have with your health care provider. Document Released: 09/27/2008 Document Revised: 11/01/2017 Document Reviewed: 03/30/2016 Elsevier Patient Education  2020 [employer].

## 2019-02-28 ENCOUNTER — Ambulatory Visit (INDEPENDENT_AMBULATORY_CARE_PROVIDER_SITE_OTHER): Payer: Medicare Other

## 2019-02-28 DIAGNOSIS — I4819 Other persistent atrial fibrillation: Secondary | ICD-10-CM

## 2019-03-13 DIAGNOSIS — Z789 Other specified health status: Secondary | ICD-10-CM | POA: Diagnosis not present

## 2019-03-13 DIAGNOSIS — Z299 Encounter for prophylactic measures, unspecified: Secondary | ICD-10-CM | POA: Diagnosis not present

## 2019-03-13 DIAGNOSIS — H00012 Hordeolum externum right lower eyelid: Secondary | ICD-10-CM | POA: Diagnosis not present

## 2019-03-13 DIAGNOSIS — Z6835 Body mass index (BMI) 35.0-35.9, adult: Secondary | ICD-10-CM | POA: Diagnosis not present

## 2019-03-13 DIAGNOSIS — L03211 Cellulitis of face: Secondary | ICD-10-CM | POA: Diagnosis not present

## 2019-03-13 DIAGNOSIS — I4891 Unspecified atrial fibrillation: Secondary | ICD-10-CM | POA: Diagnosis not present

## 2019-03-13 DIAGNOSIS — I429 Cardiomyopathy, unspecified: Secondary | ICD-10-CM | POA: Diagnosis not present

## 2019-03-20 ENCOUNTER — Other Ambulatory Visit: Payer: Self-pay

## 2019-03-20 ENCOUNTER — Other Ambulatory Visit: Payer: Medicare Other | Admitting: *Deleted

## 2019-03-20 DIAGNOSIS — I10 Essential (primary) hypertension: Secondary | ICD-10-CM | POA: Diagnosis not present

## 2019-03-20 DIAGNOSIS — Z79899 Other long term (current) drug therapy: Secondary | ICD-10-CM | POA: Diagnosis not present

## 2019-03-21 LAB — BASIC METABOLIC PANEL
BUN/Creatinine Ratio: 15 (ref 10–24)
BUN: 15 mg/dL (ref 8–27)
CO2: 26 mmol/L (ref 20–29)
Calcium: 8.6 mg/dL (ref 8.6–10.2)
Chloride: 107 mmol/L — ABNORMAL HIGH (ref 96–106)
Creatinine, Ser: 0.97 mg/dL (ref 0.76–1.27)
GFR calc Af Amer: 92 mL/min/{1.73_m2} (ref >59)
GFR calc non Af Amer: 79 mL/min/{1.73_m2} (ref >59)
Glucose: 78 mg/dL (ref 65–99)
Potassium: 4.1 mmol/L (ref 3.5–5.2)
Sodium: 145 mmol/L — ABNORMAL HIGH (ref 134–144)

## 2019-03-21 LAB — CBC
Hematocrit: 37.8 % (ref 37.5–51.0)
Hemoglobin: 12.6 g/dL — ABNORMAL LOW (ref 13.0–17.7)
MCH: 29.2 pg (ref 26.6–33.0)
MCHC: 33.3 g/dL (ref 31.5–35.7)
MCV: 88 fL (ref 79–97)
Platelets: 139 10*3/uL — ABNORMAL LOW (ref 150–450)
RBC: 4.31 x10E6/uL (ref 4.14–5.80)
RDW: 12.7 % (ref 11.6–15.4)
WBC: 6.1 10*3/uL (ref 3.4–10.8)

## 2019-03-21 NOTE — Addendum Note (Signed)
Addended by: Stanton Kidney on: 03/21/2019 11:58 AM   Modules accepted: Orders

## 2019-03-22 DIAGNOSIS — I4819 Other persistent atrial fibrillation: Secondary | ICD-10-CM | POA: Diagnosis not present

## 2019-03-27 ENCOUNTER — Other Ambulatory Visit: Payer: Self-pay | Admitting: Cardiovascular Disease

## 2019-03-27 ENCOUNTER — Other Ambulatory Visit (HOSPITAL_COMMUNITY): Payer: Self-pay | Admitting: Nurse Practitioner

## 2019-03-28 ENCOUNTER — Other Ambulatory Visit: Payer: Self-pay

## 2019-03-28 ENCOUNTER — Telehealth (INDEPENDENT_AMBULATORY_CARE_PROVIDER_SITE_OTHER): Payer: Medicare Other | Admitting: Cardiology

## 2019-03-28 DIAGNOSIS — I4819 Other persistent atrial fibrillation: Secondary | ICD-10-CM

## 2019-03-28 NOTE — Progress Notes (Signed)
Electrophysiology TeleHealth Note   Due to national recommendations of social distancing due to COVID 19, an audio/video telehealth visit is felt to be most appropriate for this patient at this time.  See Epic message for the patient's consent to telehealth for [facility].   Date:  03/28/2019   ID:  [name], DOB [birth year], MRN [id number]  Location: patient's home  Provider location: [address], [place]  Evaluation Performed: Follow-up visit  PCP:  Glenda Chroman, MD  Cardiologist:  Kate Sable, MD  Electrophysiologist:  Dr Alroy Bailiff  Chief Complaint:  AF  History of Present Illness:    [name] is a 68 y.o. male who presents via audio/video conferencing for a telehealth visit today.  Since last being seen in our clinic, the patient reports doing very well.  Today, he denies symptoms of palpitations, chest pain, shortness of breath,  lower extremity edema, dizziness, presyncope, or syncope.  The patient is otherwise without complaint today.  The patient denies symptoms of fevers, chills, cough, or new SOB worrisome for COVID 19.  He has a history of atrial fibrillation/flutter status post cardioversion.  He is currently on dofetilide but has been having more episodes of atrial fibrillation.  He has a plan for AF ablation on 04/04/2019.  Today, denies symptoms of palpitations, chest pain, shortness of breath, orthopnea, PND, lower extremity edema, claudication, dizziness, presyncope, syncope, bleeding, or neurologic sequela. The patient is tolerating medications without difficulties.    Past Medical History:  Diagnosis Date  . Arthritis    "knuckles maybe" (02/28/2018)  . Atrial fib/flutter, transient    a. s/p TEE-guided DCCV on 12/14/2017 with return to NSR.   [name] BPH (benign prostatic hyperplasia) 12/11/2017  . Essential hypertension   . Gout    "I take RX qd" (02/28/2018)  . Neuropathy   . Pneumonia 11/2017  . Thyroid nodule     Benign    Past Surgical History:  Procedure Laterality Date  . BACK SURGERY    . BIOPSY THYROID    . CARDIOVERSION N/A 12/14/2017   Procedure: CARDIOVERSION;  Surgeon: Satira Sark, MD;  Location: [facility];  Service: Cardiovascular;  Laterality: N/A;  . CARDIOVERSION N/A 01/14/2019   Procedure: CARDIOVERSION;  Surgeon: Constance Haw, MD;  Location: [place];  Service: Cardiovascular;  Laterality: N/A;  . CATARACT EXTRACTION W/ INTRAOCULAR LENS  IMPLANT, BILATERAL Bilateral 2006-2010   "right-left"  . COLONOSCOPY  2012  . IRRIGATION AND DEBRIDEMENT SEBACEOUS CYST    . LUMBAR LAMINECTOMY N/A 01/20/2013   Procedure: MICRODISCECTOMY LUMBAR LAMINECTOMY;  Surgeon: Marybelle Killings, MD;  Location: [place];  Service: Orthopedics;  Laterality: N/A;  L4-5 Decompression  . PILONIDAL CYST DRAINAGE    . TEE WITHOUT CARDIOVERSION N/A 12/14/2017   Procedure: TRANSESOPHAGEAL ECHOCARDIOGRAM (TEE) WITH PROPOFOL;  Surgeon: Satira Sark, MD;  Location: [facility];  Service: Cardiovascular;  Laterality: N/A;    Current Outpatient Medications  Medication Sig Dispense Refill  . acetaminophen (TYLENOL) 500 MG tablet Take 500-1,000 mg by mouth every 6 (six) hours as needed (for pain.).    [name] allopurinol (ZYLOPRIM) 300 MG tablet Take 300 mg by mouth daily as needed.     [name] amLODipine (NORVASC) 5 MG tablet TAKE ONE TABLET BY MOUTH DAILY. (LUNCH TIME) 90 tablet 1  . Calcium Carb-Cholecalciferol (CALCIUM + D3 PO) Take 1 tablet by mouth daily at 3 pm.     . carvedilol (COREG) 12.5 MG tablet  Take 18.75 mg by mouth 2 (two) times daily with a meal.    . cetirizine (ZYRTEC) 10 MG tablet Take 10 mg by mouth daily as needed for allergies.    . Cholecalciferol (VITAMIN D3) 2000 UNITS TABS Take 2,000 Units by mouth daily at 3 pm.     . CINNAMON PO Take 350 mg by mouth daily at 3 pm.     . Coenzyme Q10 200 MG capsule Take 200 mg by mouth daily at 3 pm.     . dofetilide (TIKOSYN) 250 MCG capsule TAKE ONE CAPSULE  BY MOUTH TWICE DAILY. 180 capsule 2  . Ferrous Fumarate (IRON) 18 MG TBCR Take 18 mg by mouth daily at 3 pm.     . fish oil-omega-3 fatty acids 1000 MG capsule Take 3,000 mg by mouth daily.     . Glucosamine-Chondroitin (GLUCOSAMINE CHONDR COMPLEX PO) Take 1 tablet by mouth 3 (three) times a week. 1500mg /1200mg     . LUTEIN-ZEAXANTHIN PO Take 1 tablet by mouth daily at 3 pm.     . Multiple Vitamins-Minerals (MULTIVITAMIN PO) Take 1 tablet by mouth daily at 3 pm.     . olmesartan (BENICAR) 40 MG tablet TAKE ONE TABLET BY MOUTH DAILY 30 tablet 6  . PHOSPHATIDYLCHOLINE PO Take 100 mg by mouth daily at 3 pm.     . potassium chloride (KLOR-CON) 10 MEQ tablet TAKE ONE TABLET BY MOUTH TWICE DAILY. 180 tablet 3  . Probiotic Product (PROBIOTIC DAILY PO) Take 2 tablets by mouth daily at 3 pm. 5 billion unit daily    . RESVERATROL 100 MG CAPS Take 100 mg by mouth daily at 3 pm.     . tamsulosin (FLOMAX) 0.4 MG CAPS capsule Take 0.4 mg by mouth daily as needed.   5  . XARELTO 20 MG TABS tablet TAKE ONE TABLET BY MOUTH DAILY WITH LUNCH 30 tablet 11   No current facility-administered medications for this visit.     Allergies:   Azithromycin   Social History:  The patient  reports that he has never smoked. He has never used smokeless tobacco. He reports that he does not drink alcohol or use drugs.   Family History:  The patient's  family history includes Atrial fibrillation in his mother; Cancer - Lung in his father; Dementia in his mother; Hypertension in an other family member; Liver cancer in his brother; Lung cancer in his father; Transient ischemic attack in his mother.   ROS:  Please see the history of present illness.   All other systems are personally reviewed and negative.    Exam:    Vital Signs:  BP 140/80   Pulse 60   no acute distress, no shortness of breath.  Labs/Other Tests and Data Reviewed:    Recent Labs: 01/14/2019: ALT 19; B Natriuretic Peptide 312.2; TSH 4.233 01/31/2019:  Magnesium 2.0 03/20/2019: BUN 15; Creatinine, Ser 0.97; Hemoglobin 12.6; Platelets 139; Potassium 4.1; Sodium 145   Wt Readings from Last 3 Encounters:  02/27/19 275 lb 14.4 oz (125.1 kg)  02/23/19 274 lb (124.3 kg)  02/07/19 275 lb (124.7 kg)     Other studies personally reviewed: Additional studies/ records that were reviewed today include: ECG 02/27/2019 personally reviewed Review of the above records today demonstrates: Sinus rhythm, rate 61  ASSESSMENT & PLAN:    1.  Persistent atrial fibrillation/flutter: Currently on Xarelto and dofetilide.  He continues to have atrial fibrillation.  Plan for ablation.  Risks and benefits discussed include bleeding, tamponade,  heart block, stroke, damage surrounding organs.  He understands these risks and has agreed to the procedure.  This patients CHA2DS2-VASc Score and unadjusted Ischemic Stroke Rate (% per year) is equal to 2.2 % stroke rate/year from a score of 2  Above score calculated as 1 point each if present [CHF, HTN, DM, Vascular=MI/PAD/Aortic Plaque, Age if 65-74, or Male] Above score calculated as 2 points each if present [Age > 75, or Stroke/TIA/TE]  2.  Hypertension: Mildly elevated today.  Maebel Marasco reassess after ablation.   COVID 19 screen The patient denies symptoms of COVID 19 at this time.  The importance of social distancing was discussed today.  Follow-up:  3 months  Current medicines are reviewed at length with the patient today.   The patient does not have concerns regarding his medicines.  The following changes were made today:  none  Labs/ tests ordered today include:  No orders of the defined types were placed in this encounter.    Patient Risk:  after full review of this patients clinical status, I feel that they are at moderate risk at this time.  Today, I have spent 8 minutes with the patient with telehealth technology discussing AF .    Signed, Yisel Megill Meredith Leeds, MD  03/28/2019 8:26 AM     [facility] [address] [place] [address] [phone] (office) [phone] (fax)

## 2019-03-29 ENCOUNTER — Telehealth (HOSPITAL_COMMUNITY): Payer: Self-pay | Admitting: Emergency Medicine

## 2019-03-29 NOTE — Telephone Encounter (Signed)
Pt returning phone call regarding upcoming cardiac imaging study; pt verbalizes understanding of appt date/time, parking situation and where to check in, pre-test NPO status and medications ordered, and verified current allergies; name and call back number provided for further questions should they arise Lulia Schriner RN Navigator Cardiac Imaging [facility] [phone] office [phone] cell   

## 2019-03-29 NOTE — Telephone Encounter (Signed)
Left message on voicemail with name and callback number Chancelor Hardrick RN Navigator Cardiac Imaging [facility] [phone] Office [phone] Cell  

## 2019-03-30 ENCOUNTER — Ambulatory Visit (HOSPITAL_COMMUNITY)
Admission: RE | Admit: 2019-03-30 | Discharge: 2019-03-30 | Disposition: A | Discharge status: Home / Self Care | Payer: Medicare Other | Source: Ambulatory Visit | Attending: Cardiology | Admitting: Cardiology

## 2019-03-30 ENCOUNTER — Other Ambulatory Visit: Payer: Self-pay

## 2019-03-30 ENCOUNTER — Encounter (HOSPITAL_COMMUNITY): Payer: Self-pay

## 2019-03-30 DIAGNOSIS — I4819 Other persistent atrial fibrillation: Secondary | ICD-10-CM | POA: Diagnosis not present

## 2019-03-30 MED ORDER — IOHEXOL 350 MG/ML SOLN
100.0000 mL | Freq: Once | INTRAVENOUS | Status: AC | PRN
  Administered 2019-03-30: 10:00:00 100 mL via INTRAVENOUS

## 2019-04-01 ENCOUNTER — Other Ambulatory Visit (HOSPITAL_COMMUNITY)
Admission: RE | Admit: 2019-04-01 | Discharge: 2019-04-01 | Disposition: A | Discharge status: Home / Self Care | Payer: Medicare Other | Source: Ambulatory Visit | Attending: Cardiology | Admitting: Cardiology

## 2019-04-01 DIAGNOSIS — Z01812 Encounter for preprocedural laboratory examination: Secondary | ICD-10-CM | POA: Insufficient documentation

## 2019-04-01 DIAGNOSIS — Z20828 Contact with and (suspected) exposure to other viral communicable diseases: Secondary | ICD-10-CM | POA: Insufficient documentation

## 2019-04-01 LAB — SARS CORONAVIRUS 2 (TAT 6-24 HRS): SARS Coronavirus 2: NEGATIVE

## 2019-04-03 ENCOUNTER — Telehealth: Payer: Self-pay | Admitting: Cardiology

## 2019-04-03 NOTE — Telephone Encounter (Signed)
Pt wanted to confirm if he should take his Xarelto tonight.  Instructed pt to take tonight. Patient verbalized understanding and agreeable to plan.

## 2019-04-03 NOTE — Telephone Encounter (Signed)
Patient has questions regarding his procedure tomorrow with Dr. Curt Bears, please advise.

## 2019-04-03 NOTE — Anesthesia Preprocedure Evaluation (Addendum)
Anesthesia Evaluation  Patient identified by MRN, date of birth, ID band Patient awake    Reviewed: Allergy & Precautions, NPO status , Patient's Chart, lab work & pertinent test results  Airway Mallampati: II  TM Distance: >3 FB Neck ROM: full  Mouth opening: Limited Mouth Opening  Dental  (+) Teeth Intact, Dental Advidsory Given   Pulmonary    breath sounds clear to auscultation       Cardiovascular hypertension, + CAD and +CHF  + dysrhythmias Atrial Fibrillation  Rhythm:irregular     Neuro/Psych    GI/Hepatic negative GI ROS, Neg liver ROS,   Endo/Other  negative endocrine ROS  Renal/GU negative Renal ROS     Musculoskeletal   Abdominal   Peds  Hematology  (+) Blood dyscrasia, anemia ,   Anesthesia Other Findings Lower lip with preexisting sore.  Reproductive/Obstetrics                         Anesthesia Physical Anesthesia Plan  ASA: III  Anesthesia Plan: General   Post-op Pain Management:    Induction: Intravenous  PONV Risk Score and Plan: 2 and Ondansetron  Airway Management Planned: Oral ETT  Additional Equipment:   Intra-op Plan:   Post-operative Plan: Possible Post-op intubation/ventilation  Informed Consent: I have reviewed the patients History and Physical, chart, labs and discussed the procedure including the risks, benefits and alternatives for the proposed anesthesia with the patient or authorized representative who has indicated his/her understanding and acceptance.     Dental Advisory Given and Dental advisory given  Plan Discussed with: Anesthesiologist and CRNA  Anesthesia Plan Comments:       Anesthesia Quick Evaluation

## 2019-04-04 ENCOUNTER — Other Ambulatory Visit: Payer: Self-pay

## 2019-04-04 ENCOUNTER — Encounter (HOSPITAL_COMMUNITY)
Admission: RE | Disposition: A | Discharge status: Home / Self Care | Payer: Self-pay | Source: Home / Self Care | Attending: Cardiology

## 2019-04-04 ENCOUNTER — Ambulatory Visit (HOSPITAL_COMMUNITY)
Admission: RE | Admit: 2019-04-04 | Discharge: 2019-04-04 | Disposition: A | Discharge status: Home / Self Care | Payer: Medicare Other | Source: Home / Self Care | Attending: Cardiology | Admitting: Cardiology

## 2019-04-04 ENCOUNTER — Ambulatory Visit (HOSPITAL_COMMUNITY): Payer: Medicare Other | Admitting: Anesthesiology

## 2019-04-04 ENCOUNTER — Encounter (HOSPITAL_COMMUNITY): Payer: Self-pay | Admitting: Anesthesiology

## 2019-04-04 DIAGNOSIS — I10 Essential (primary) hypertension: Secondary | ICD-10-CM | POA: Insufficient documentation

## 2019-04-04 DIAGNOSIS — J189 Pneumonia, unspecified organism: Secondary | ICD-10-CM | POA: Diagnosis not present

## 2019-04-04 DIAGNOSIS — I4892 Unspecified atrial flutter: Secondary | ICD-10-CM | POA: Insufficient documentation

## 2019-04-04 DIAGNOSIS — I509 Heart failure, unspecified: Secondary | ICD-10-CM | POA: Diagnosis not present

## 2019-04-04 DIAGNOSIS — I5042 Chronic combined systolic (congestive) and diastolic (congestive) heart failure: Secondary | ICD-10-CM | POA: Diagnosis not present

## 2019-04-04 DIAGNOSIS — G629 Polyneuropathy, unspecified: Secondary | ICD-10-CM | POA: Insufficient documentation

## 2019-04-04 DIAGNOSIS — R0600 Dyspnea, unspecified: Secondary | ICD-10-CM | POA: Diagnosis not present

## 2019-04-04 DIAGNOSIS — I2510 Atherosclerotic heart disease of native coronary artery without angina pectoris: Secondary | ICD-10-CM | POA: Diagnosis not present

## 2019-04-04 DIAGNOSIS — Z8249 Family history of ischemic heart disease and other diseases of the circulatory system: Secondary | ICD-10-CM | POA: Insufficient documentation

## 2019-04-04 DIAGNOSIS — R0602 Shortness of breath: Secondary | ICD-10-CM | POA: Diagnosis not present

## 2019-04-04 DIAGNOSIS — J9601 Acute respiratory failure with hypoxia: Secondary | ICD-10-CM | POA: Diagnosis not present

## 2019-04-04 DIAGNOSIS — I4891 Unspecified atrial fibrillation: Secondary | ICD-10-CM | POA: Diagnosis not present

## 2019-04-04 DIAGNOSIS — Z881 Allergy status to other antibiotic agents status: Secondary | ICD-10-CM | POA: Insufficient documentation

## 2019-04-04 DIAGNOSIS — G588 Other specified mononeuropathies: Secondary | ICD-10-CM | POA: Diagnosis not present

## 2019-04-04 DIAGNOSIS — I4819 Other persistent atrial fibrillation: Secondary | ICD-10-CM | POA: Diagnosis not present

## 2019-04-04 DIAGNOSIS — Q211 Atrial septal defect: Secondary | ICD-10-CM | POA: Diagnosis not present

## 2019-04-04 DIAGNOSIS — Z79899 Other long term (current) drug therapy: Secondary | ICD-10-CM | POA: Insufficient documentation

## 2019-04-04 DIAGNOSIS — Z7901 Long term (current) use of anticoagulants: Secondary | ICD-10-CM | POA: Insufficient documentation

## 2019-04-04 DIAGNOSIS — I110 Hypertensive heart disease with heart failure: Secondary | ICD-10-CM | POA: Diagnosis not present

## 2019-04-04 DIAGNOSIS — N400 Enlarged prostate without lower urinary tract symptoms: Secondary | ICD-10-CM | POA: Insufficient documentation

## 2019-04-04 DIAGNOSIS — M109 Gout, unspecified: Secondary | ICD-10-CM | POA: Insufficient documentation

## 2019-04-04 DIAGNOSIS — E041 Nontoxic single thyroid nodule: Secondary | ICD-10-CM | POA: Diagnosis not present

## 2019-04-04 DIAGNOSIS — Z20828 Contact with and (suspected) exposure to other viral communicable diseases: Secondary | ICD-10-CM | POA: Diagnosis not present

## 2019-04-04 HISTORY — PX: ATRIAL FIBRILLATION ABLATION: EP1191

## 2019-04-04 LAB — POCT ACTIVATED CLOTTING TIME
Activated Clotting Time: 351 seconds
Activated Clotting Time: 340 seconds
Activated Clotting Time: 334 seconds

## 2019-04-04 SURGERY — ATRIAL FIBRILLATION ABLATION
Anesthesia: General

## 2019-04-04 MED ORDER — SUGAMMADEX SODIUM 500 MG/5ML IV SOLN
INTRAVENOUS | Status: DC | PRN
  Administered 2019-04-04: 300 mg via INTRAVENOUS

## 2019-04-04 MED ORDER — PROPOFOL 10 MG/ML IV BOLUS
INTRAVENOUS | Status: DC | PRN
  Administered 2019-04-04: 30 mg via INTRAVENOUS
  Administered 2019-04-04: 125 mg via INTRAVENOUS
  Administered 2019-04-04: 30 mg via INTRAVENOUS

## 2019-04-04 MED ORDER — HYDROMORPHONE HCL 1 MG/ML IJ SOLN: 0.2500 mg | INTRAMUSCULAR | Status: DC | PRN

## 2019-04-04 MED ORDER — BUPIVACAINE HCL (PF) 0.25 % IJ SOLN
INTRAMUSCULAR | Status: DC | PRN
  Administered 2019-04-04: 30 mL

## 2019-04-04 MED ORDER — HEPARIN (PORCINE) IN NACL 1000-0.9 UT/500ML-% IV SOLN
INTRAVENOUS | Status: AC
  Filled 2019-04-04: qty 500

## 2019-04-04 MED ORDER — DEXAMETHASONE SODIUM PHOSPHATE 10 MG/ML IJ SOLN
INTRAMUSCULAR | Status: DC | PRN
  Administered 2019-04-04: 10 mg via INTRAVENOUS

## 2019-04-04 MED ORDER — GLYCOPYRROLATE PF 0.2 MG/ML IJ SOSY
PREFILLED_SYRINGE | INTRAMUSCULAR | Status: DC | PRN
  Administered 2019-04-04: .2 mg via INTRAVENOUS

## 2019-04-04 MED ORDER — ACETAMINOPHEN 325 MG PO TABS
650.0000 mg | ORAL_TABLET | ORAL | Status: DC | PRN
  Filled 2019-04-04: qty 2

## 2019-04-04 MED ORDER — ONDANSETRON HCL 4 MG/2ML IJ SOLN: 4.0000 mg | Freq: Four times a day (QID) | INTRAMUSCULAR | Status: DC | PRN

## 2019-04-04 MED ORDER — PROTAMINE SULFATE 10 MG/ML IV SOLN
INTRAVENOUS | Status: DC | PRN
  Administered 2019-04-04 (×2): 20 mg via INTRAVENOUS

## 2019-04-04 MED ORDER — LIDOCAINE 2% (20 MG/ML) 5 ML SYRINGE
INTRAMUSCULAR | Status: DC | PRN
  Administered 2019-04-04: 100 mg via INTRAVENOUS

## 2019-04-04 MED ORDER — SUCCINYLCHOLINE CHLORIDE 20 MG/ML IJ SOLN
INTRAMUSCULAR | Status: DC | PRN
  Administered 2019-04-04: 120 mg via INTRAVENOUS

## 2019-04-04 MED ORDER — DOBUTAMINE IN D5W 4-5 MG/ML-% IV SOLN
INTRAVENOUS | Status: DC | PRN
  Administered 2019-04-04: 20 ug/kg/min via INTRAVENOUS

## 2019-04-04 MED ORDER — EPHEDRINE SULFATE 50 MG/ML IJ SOLN
INTRAMUSCULAR | Status: DC | PRN
  Administered 2019-04-04 (×2): 10 mg via INTRAVENOUS
  Administered 2019-04-04: 5 mg via INTRAVENOUS
  Administered 2019-04-04: 10 mg via INTRAVENOUS

## 2019-04-04 MED ORDER — SODIUM CHLORIDE 0.9 % IV SOLN
INTRAVENOUS | Status: DC | PRN
  Administered 2019-04-04 (×2): via INTRAVENOUS

## 2019-04-04 MED ORDER — LACTATED RINGERS IV SOLN: INTRAVENOUS | Status: DC | PRN

## 2019-04-04 MED ORDER — ROCURONIUM BROMIDE 50 MG/5ML IV SOSY
PREFILLED_SYRINGE | INTRAVENOUS | Status: DC | PRN
  Administered 2019-04-04: 60 mg via INTRAVENOUS

## 2019-04-04 MED ORDER — SODIUM CHLORIDE 0.9% FLUSH: 3.0000 mL | Freq: Two times a day (BID) | INTRAVENOUS | Status: DC

## 2019-04-04 MED ORDER — SODIUM CHLORIDE 0.9% FLUSH: 3.0000 mL | INTRAVENOUS | Status: DC | PRN

## 2019-04-04 MED ORDER — BUPIVACAINE HCL (PF) 0.25 % IJ SOLN
INTRAMUSCULAR | Status: AC
  Filled 2019-04-04: qty 30

## 2019-04-04 MED ORDER — SODIUM CHLORIDE 0.9 % IV SOLN
INTRAVENOUS | Status: DC
  Administered 2019-04-04: 07:00:00 via INTRAVENOUS

## 2019-04-04 MED ORDER — DOBUTAMINE IN D5W 4-5 MG/ML-% IV SOLN
INTRAVENOUS | Status: AC
  Filled 2019-04-04: qty 250

## 2019-04-04 MED ORDER — ONDANSETRON HCL 4 MG/2ML IJ SOLN
INTRAMUSCULAR | Status: DC | PRN
  Administered 2019-04-04: 4 mg via INTRAVENOUS

## 2019-04-04 MED ORDER — HEPARIN SODIUM (PORCINE) 1000 UNIT/ML IJ SOLN
INTRAMUSCULAR | Status: AC
  Filled 2019-04-04: qty 1

## 2019-04-04 MED ORDER — FENTANYL CITRATE (PF) 100 MCG/2ML IJ SOLN
INTRAMUSCULAR | Status: DC | PRN
  Administered 2019-04-04: 25 ug via INTRAVENOUS
  Administered 2019-04-04 (×2): 50 ug via INTRAVENOUS
  Administered 2019-04-04: 25 ug via INTRAVENOUS

## 2019-04-04 MED ORDER — HEPARIN SODIUM (PORCINE) 1000 UNIT/ML IJ SOLN
INTRAMUSCULAR | Status: DC | PRN
  Administered 2019-04-04: 14000 [IU] via INTRAVENOUS
  Administered 2019-04-04: 2000 [IU] via INTRAVENOUS

## 2019-04-04 MED ORDER — HEPARIN SODIUM (PORCINE) 1000 UNIT/ML IJ SOLN
INTRAMUSCULAR | Status: DC | PRN
  Administered 2019-04-04: 1000 [IU] via INTRAVENOUS

## 2019-04-04 MED ORDER — SODIUM CHLORIDE 0.9 % IV SOLN: 250.0000 mL | INTRAVENOUS | Status: DC | PRN

## 2019-04-04 MED ORDER — HEPARIN (PORCINE) IN NACL 2-0.9 UNITS/ML
INTRAMUSCULAR | Status: AC | PRN
  Administered 2019-04-04 (×5): 500 mL via INTRAVENOUS

## 2019-04-04 MED ORDER — PHENYLEPHRINE HCL-NACL 10-0.9 MG/250ML-% IV SOLN
INTRAVENOUS | Status: DC | PRN
  Administered 2019-04-04: 50 ug/min via INTRAVENOUS

## 2019-04-04 SURGICAL SUPPLY — 23 items
BLANKET WARM UNDERBOD FULL ACC (MISCELLANEOUS) ×3 IMPLANT
CATH MAPPNG PENTARAY F 2-6-2MM (CATHETERS) ×1 IMPLANT
CATH SMTCH THERMOCOOL SF DF (CATHETERS) ×3 IMPLANT
CATH SOUNDSTAR 3D IMAGING (CATHETERS) ×3 IMPLANT
CATH WEBSTER BI DIR CS D-F CRV (CATHETERS) ×3 IMPLANT
COVER SWIFTLINK CONNECTOR (BAG) ×3 IMPLANT
DEVICE CLOSURE PERCLS PRGLD 6F (VASCULAR PRODUCTS) ×4 IMPLANT
HOVERMATT SINGLE USE (MISCELLANEOUS) ×3 IMPLANT
PACK EP LATEX FREE (CUSTOM PROCEDURE TRAY) ×2
PACK EP LF (CUSTOM PROCEDURE TRAY) ×1 IMPLANT
PAD PRO RADIOLUCENT 2001M-C (PAD) ×3 IMPLANT
PATCH CARTO3 (PAD) ×3 IMPLANT
PENTARAY F 2-6-2MM (CATHETERS) ×3
PERCLOSE PROGLIDE 6F (VASCULAR PRODUCTS) ×12
SHEATH AVANTI 11F 11CM (SHEATH) ×3 IMPLANT
SHEATH BAYLIS SUREFLEX  M 8.5 (SHEATH) ×2
SHEATH BAYLIS SUREFLEX M 8.5 (SHEATH) ×1 IMPLANT
SHEATH BAYLIS TRANSSEPTAL 98CM (NEEDLE) ×3 IMPLANT
SHEATH CARTO VIZIGO SM CVD (SHEATH) ×3 IMPLANT
SHEATH PINNACLE 7F 10CM (SHEATH) ×3 IMPLANT
SHEATH PINNACLE 8F 10CM (SHEATH) ×6 IMPLANT
SHEATH PROBE COVER 6X72 (BAG) ×3 IMPLANT
TUBING SMART ABLATE COOLFLOW (TUBING) ×3 IMPLANT

## 2019-04-04 NOTE — Anesthesia Procedure Notes (Signed)
Procedure Name: Intubation Date/Time: 04/04/2019 7:49 AM Performed by: Neldon Newport, CRNA Pre-anesthesia Checklist: Timeout performed, Patient being monitored, Suction available, Emergency Drugs available and Patient identified Patient Re-evaluated:Patient Re-evaluated prior to induction Oxygen Delivery Method: Circle system utilized Preoxygenation: Pre-oxygenation with 100% oxygen Induction Type: IV induction and Rapid sequence Ventilation: Mask ventilation without difficulty Laryngoscope Size: Mac, 4, Glidescope and 3 Grade View: Grade III Tube type: Oral Tube size: 7.5 mm Number of attempts: 2 Placement Confirmation: breath sounds checked- equal and bilateral,  positive ETCO2 and ETT inserted through vocal cords under direct vision Secured at: 24 cm Tube secured with: Tape Dental Injury: Teeth and Oropharynx as per pre-operative assessment

## 2019-04-04 NOTE — Discharge Instructions (Signed)
Post procedure care instructions No driving for 4 days. No lifting over 5 lbs for 1 week. No vigorous or sexual activity for 1 week. You may return to work on 04/11/2019. Keep procedure site clean & dry. If you notice increased pain, swelling, bleeding or pus, call/return!  You may shower, but no soaking baths/hot tubs/pools for 1 week.     Cardiac Ablation, Care After This sheet gives you information about how to care for yourself after your procedure. Your health care provider may also give you more specific instructions. If you have problems or questions, contact your health care provider. What can I expect after the procedure? After the procedure, it is common to have:  Bruising around your puncture site.  Tenderness around your puncture site.  Skipped heartbeats.  Tiredness (fatigue).  Follow these instructions at home: Puncture site care   Follow instructions from your health care provider about how to take care of your puncture site. Make sure you: ? If present, leave stitches (sutures), skin glue, or adhesive strips in place. These skin closures may need to stay in place for up to 2 weeks. If adhesive strip edges start to loosen and curl up, you may trim the loose edges. Do not remove adhesive strips completely unless your health care provider tells you to do that.  Check your puncture site every day for signs of infection. Check for: ? Redness, swelling, or pain. ? Fluid or blood. If your puncture site starts to bleed, lie down on your back, apply firm pressure to the area, and contact your health care provider. ? Warmth. ? Pus or a bad smell. Driving  Do not drive for at least 4 days after your procedure or however long your health care provider recommends.  Do not drive or use heavy machinery while taking prescription pain medicine.  Do not drive for 24 hours if you were given a medicine to help you relax (sedative) during your procedure. Activity  Avoid activities  that take a lot of effort for at least 7 days after your procedure.  Do not lift anything that is heavier than 5 lb (4.5 kg) for one week.   No sexual activity for 1 week.   Return to your normal activities as told by your health care provider. Ask your health care provider what activities are safe for you. General instructions  Take over-the-counter and prescription medicines only as told by your health care provider.  Do not use any products that contain nicotine or tobacco, such as cigarettes and e-cigarettes. If you need help quitting, ask your health care provider.  You may shower after 24 hours, but Do not take baths, swim, or use a hot tub for 1 week.   Do not drink alcohol for 24 hours after your procedure.  Keep all follow-up visits as told by your health care provider. This is important. Contact a health care provider if:  You have redness, mild swelling, or pain around your puncture site.  You have fluid or blood coming from your puncture site that stops after applying firm pressure to the area.  Your puncture site feels warm to the touch.  You have pus or a bad smell coming from your puncture site.  You have a fever.  You have chest pain or discomfort that spreads to your neck, jaw, or arm.  You are sweating a lot.  You feel nauseous.  You have a fast or irregular heartbeat.  You have shortness of breath.  You are  dizzy or light-headed and feel the need to lie down.  You have pain or numbness in the arm or leg closest to your puncture site. Get help right away if:  Your puncture site suddenly swells.  Your puncture site is bleeding and the bleeding does not stop after applying firm pressure to the area. These symptoms may represent a serious problem that is an emergency. Do not wait to see if the symptoms will go away. Get medical help right away. Call your local emergency services (911 in the [place] Do not drive yourself to the hospital. Summary  After  the procedure, it is normal to have bruising and tenderness at the puncture site in your groin, neck, or forearm.  Check your puncture site every day for signs of infection.  Get help right away if your puncture site is bleeding and the bleeding does not stop after applying firm pressure to the area. This is a medical emergency. This information is not intended to replace advice given to you by your health care provider. Make sure you discuss any questions you have with your health care provider.   You have an appointment set up with the [facility].  Multiple studies have shown that being followed by a dedicated atrial fibrillation clinic in addition to the standard care you receive from your other physicians improves health. We believe that enrollment in the atrial fibrillation clinic will allow [place] to better care for you.   The phone number to the [facility] is [phone]. The clinic is staffed Monday through Friday from 8:30am to 5pm.  Parking Directions: The clinic is located in the Heart and Vascular Building connected to [facility]. 1)From [address] turn on to [address] and go to the 3rd entrance  (Heart and Vascular entrance) on the right. 2)Look to the right for Heart &Vascular Parking Garage. 3)A code for the entrance is required please call the clinic to receive this.   4)Take the elevators to the 1st floor. Registration is in the room with the glass walls at the end of the hallway.  If you have any trouble parking or locating the clinic, please dont hesitate to call [phone].

## 2019-04-04 NOTE — Anesthesia Postprocedure Evaluation (Signed)
Anesthesia Post Note  Patient: [name]  Procedure(s) Performed: ATRIAL FIBRILLATION ABLATION (N/A )     Anesthesia Post Evaluation  Last Vitals:  Vitals:   04/04/19 1155 04/04/19 1200  BP: 131/64 136/71  Pulse: (!) 57 73  Resp: 13 19  Temp:  36.7 C  SpO2: 99% 98%    Last Pain:  Vitals:   04/04/19 1200  TempSrc: Temporal  PainSc: 0-No pain                 Nanako Stopher

## 2019-04-04 NOTE — Transfer of Care (Signed)
Immediate Anesthesia Transfer of Care Note  Patient: [name]  Procedure(s) Performed: ATRIAL FIBRILLATION ABLATION (N/A )  Patient Location: Cath Lab  Anesthesia Type:General  Level of Consciousness: awake, alert  and oriented  Airway & Oxygen Therapy: Patient Spontanous Breathing and Patient connected to face mask oxygen  Post-op Assessment: Report given to RN, Post -op Vital signs reviewed and stable and Patient moving all extremities X 4  Post vital signs: Reviewed and stable  Last Vitals:  Vitals Value Taken Time  BP 131/75 04/04/19 1206  Temp 36.7 C 04/04/19 1200  Pulse 65 04/04/19 1213  Resp 14 04/04/19 1213  SpO2 98 % 04/04/19 1213  Vitals shown include unvalidated device data.  Last Pain:  Vitals:   04/04/19 1200  TempSrc: Temporal  PainSc: 0-No pain      Patients Stated Pain Goal: 3 ([id number])  Complications: No apparent anesthesia complications

## 2019-04-04 NOTE — H&P (Signed)
[name] has presented today for surgery, with the diagnosis of atrial fibrillation.  The various methods of treatment have been discussed with the patient and family. After consideration of risks, benefits and other options for treatment, the patient has consented to  Procedure(s): Catheter ablation as a surgical intervention .  Risks include but not limited to bleeding, tamponade, heart block, stroke, damage to surrounding organs, among others. The patient's history has been reviewed, patient examined, no change in status, stable for surgery.  I have reviewed the patient's chart and labs.  Questions were answered to the patient's satisfaction.    Calieb Lichtman Curt Bears, MD 04/04/2019 7:10 AM

## 2019-04-05 ENCOUNTER — Encounter (HOSPITAL_COMMUNITY): Payer: Self-pay | Admitting: Cardiology

## 2019-04-06 ENCOUNTER — Encounter (HOSPITAL_COMMUNITY): Payer: Self-pay | Admitting: Emergency Medicine

## 2019-04-06 ENCOUNTER — Inpatient Hospital Stay (HOSPITAL_COMMUNITY)
Admission: EM | Admit: 2019-04-06 | Discharge: 2019-04-08 | DRG: 291 | Disposition: A | Discharge status: Home / Self Care | Payer: Medicare Other | Attending: Family Medicine | Admitting: Family Medicine

## 2019-04-06 ENCOUNTER — Inpatient Hospital Stay (HOSPITAL_COMMUNITY): Payer: Medicare Other

## 2019-04-06 ENCOUNTER — Emergency Department (HOSPITAL_COMMUNITY): Payer: Medicare Other

## 2019-04-06 ENCOUNTER — Other Ambulatory Visit: Payer: Self-pay

## 2019-04-06 DIAGNOSIS — I110 Hypertensive heart disease with heart failure: Secondary | ICD-10-CM | POA: Diagnosis not present

## 2019-04-06 DIAGNOSIS — Z20828 Contact with and (suspected) exposure to other viral communicable diseases: Secondary | ICD-10-CM | POA: Diagnosis present

## 2019-04-06 DIAGNOSIS — N400 Enlarged prostate without lower urinary tract symptoms: Secondary | ICD-10-CM | POA: Diagnosis present

## 2019-04-06 DIAGNOSIS — J189 Pneumonia, unspecified organism: Secondary | ICD-10-CM | POA: Diagnosis present

## 2019-04-06 DIAGNOSIS — J9601 Acute respiratory failure with hypoxia: Secondary | ICD-10-CM | POA: Diagnosis present

## 2019-04-06 DIAGNOSIS — R0600 Dyspnea, unspecified: Secondary | ICD-10-CM | POA: Diagnosis not present

## 2019-04-06 DIAGNOSIS — R079 Chest pain, unspecified: Secondary | ICD-10-CM | POA: Diagnosis not present

## 2019-04-06 DIAGNOSIS — R7989 Other specified abnormal findings of blood chemistry: Secondary | ICD-10-CM | POA: Diagnosis not present

## 2019-04-06 DIAGNOSIS — Z7901 Long term (current) use of anticoagulants: Secondary | ICD-10-CM

## 2019-04-06 DIAGNOSIS — G5680 Other specified mononeuropathies of unspecified upper limb: Secondary | ICD-10-CM | POA: Diagnosis present

## 2019-04-06 DIAGNOSIS — I5042 Chronic combined systolic (congestive) and diastolic (congestive) heart failure: Secondary | ICD-10-CM | POA: Diagnosis present

## 2019-04-06 DIAGNOSIS — Z9841 Cataract extraction status, right eye: Secondary | ICD-10-CM

## 2019-04-06 DIAGNOSIS — Z6835 Body mass index (BMI) 35.0-35.9, adult: Secondary | ICD-10-CM

## 2019-04-06 DIAGNOSIS — I509 Heart failure, unspecified: Secondary | ICD-10-CM

## 2019-04-06 DIAGNOSIS — I10 Essential (primary) hypertension: Secondary | ICD-10-CM | POA: Diagnosis present

## 2019-04-06 DIAGNOSIS — Z8249 Family history of ischemic heart disease and other diseases of the circulatory system: Secondary | ICD-10-CM | POA: Diagnosis not present

## 2019-04-06 DIAGNOSIS — Z801 Family history of malignant neoplasm of trachea, bronchus and lung: Secondary | ICD-10-CM

## 2019-04-06 DIAGNOSIS — G2581 Restless legs syndrome: Secondary | ICD-10-CM | POA: Diagnosis present

## 2019-04-06 DIAGNOSIS — I4819 Other persistent atrial fibrillation: Secondary | ICD-10-CM | POA: Diagnosis not present

## 2019-04-06 DIAGNOSIS — Z961 Presence of intraocular lens: Secondary | ICD-10-CM | POA: Diagnosis present

## 2019-04-06 DIAGNOSIS — Z9842 Cataract extraction status, left eye: Secondary | ICD-10-CM | POA: Diagnosis not present

## 2019-04-06 DIAGNOSIS — E669 Obesity, unspecified: Secondary | ICD-10-CM | POA: Diagnosis present

## 2019-04-06 DIAGNOSIS — E041 Nontoxic single thyroid nodule: Secondary | ICD-10-CM | POA: Diagnosis present

## 2019-04-06 DIAGNOSIS — I4892 Unspecified atrial flutter: Secondary | ICD-10-CM | POA: Diagnosis not present

## 2019-04-06 DIAGNOSIS — I4891 Unspecified atrial fibrillation: Secondary | ICD-10-CM | POA: Diagnosis present

## 2019-04-06 DIAGNOSIS — M109 Gout, unspecified: Secondary | ICD-10-CM | POA: Diagnosis present

## 2019-04-06 DIAGNOSIS — Z818 Family history of other mental and behavioral disorders: Secondary | ICD-10-CM

## 2019-04-06 DIAGNOSIS — Z9889 Other specified postprocedural states: Secondary | ICD-10-CM

## 2019-04-06 DIAGNOSIS — Z79899 Other long term (current) drug therapy: Secondary | ICD-10-CM

## 2019-04-06 DIAGNOSIS — I480 Paroxysmal atrial fibrillation: Secondary | ICD-10-CM

## 2019-04-06 DIAGNOSIS — G588 Other specified mononeuropathies: Secondary | ICD-10-CM | POA: Diagnosis not present

## 2019-04-06 DIAGNOSIS — J9811 Atelectasis: Secondary | ICD-10-CM | POA: Diagnosis not present

## 2019-04-06 DIAGNOSIS — I5022 Chronic systolic (congestive) heart failure: Secondary | ICD-10-CM | POA: Diagnosis present

## 2019-04-06 DIAGNOSIS — Q211 Atrial septal defect: Secondary | ICD-10-CM | POA: Diagnosis not present

## 2019-04-06 DIAGNOSIS — R0602 Shortness of breath: Secondary | ICD-10-CM | POA: Diagnosis not present

## 2019-04-06 DIAGNOSIS — I340 Nonrheumatic mitral (valve) insufficiency: Secondary | ICD-10-CM | POA: Diagnosis not present

## 2019-04-06 DIAGNOSIS — Z8679 Personal history of other diseases of the circulatory system: Secondary | ICD-10-CM

## 2019-04-06 DIAGNOSIS — Z800 Family history of malignant neoplasm of digestive organs: Secondary | ICD-10-CM

## 2019-04-06 DIAGNOSIS — I2510 Atherosclerotic heart disease of native coronary artery without angina pectoris: Secondary | ICD-10-CM | POA: Diagnosis present

## 2019-04-06 DIAGNOSIS — I517 Cardiomegaly: Secondary | ICD-10-CM | POA: Diagnosis not present

## 2019-04-06 LAB — BLOOD GAS, ARTERIAL
Acid-Base Excess: 1.1 mmol/L (ref 0.0–2.0)
Acid-base deficit: 25.4 mmol/L — ABNORMAL HIGH (ref 0.0–2.0)
Bicarbonate: 25.4 mmol/L (ref 20.0–28.0)
FIO2: 21
O2 Saturation: 93.4 %
Patient temperature: 37
pCO2 arterial: 38.7 mmHg (ref 32.0–48.0)
pH, Arterial: 7.426 (ref 7.350–7.450)
pO2, Arterial: 65.8 mmHg — ABNORMAL LOW (ref 83.0–108.0)

## 2019-04-06 LAB — BASIC METABOLIC PANEL
Anion gap: 9 (ref 5–15)
BUN: 22 mg/dL (ref 8–23)
CO2: 24 mmol/L (ref 22–32)
Calcium: 8.2 mg/dL — ABNORMAL LOW (ref 8.9–10.3)
Chloride: 104 mmol/L (ref 98–111)
Creatinine, Ser: 1.02 mg/dL (ref 0.61–1.24)
GFR calc Af Amer: >60 mL/min (ref >60)
GFR calc non Af Amer: >60 mL/min (ref >60)
Glucose, Bld: 145 mg/dL — ABNORMAL HIGH (ref 70–99)
Potassium: 3.7 mmol/L (ref 3.5–5.1)
Sodium: 137 mmol/L (ref 135–145)

## 2019-04-06 LAB — CBC
HCT: 37.6 % — ABNORMAL LOW (ref 39.0–52.0)
Hemoglobin: 12.3 g/dL — ABNORMAL LOW (ref 13.0–17.0)
MCH: 30.2 pg (ref 26.0–34.0)
MCHC: 32.7 g/dL (ref 30.0–36.0)
MCV: 92.4 fL (ref 80.0–100.0)
Platelets: 120 10*3/uL — ABNORMAL LOW (ref 150–400)
RBC: 4.07 MIL/uL — ABNORMAL LOW (ref 4.22–5.81)
RDW: 13.1 % (ref 11.5–15.5)
WBC: 11.7 10*3/uL — ABNORMAL HIGH (ref 4.0–10.5)
nRBC: 0 % (ref 0.0–0.2)

## 2019-04-06 LAB — PHOSPHORUS: Phosphorus: 2.3 mg/dL — ABNORMAL LOW (ref 2.5–4.6)

## 2019-04-06 LAB — LACTIC ACID, PLASMA: Lactic Acid, Venous: 1.1 mmol/L (ref 0.5–1.9)

## 2019-04-06 LAB — BRAIN NATRIURETIC PEPTIDE: B Natriuretic Peptide: 61.5 pg/mL (ref 0.0–100.0)

## 2019-04-06 LAB — ECHOCARDIOGRAM COMPLETE
Height: 74 in
Weight: 4416 oz

## 2019-04-06 LAB — SARS CORONAVIRUS 2 (TAT 6-24 HRS): SARS Coronavirus 2: NEGATIVE

## 2019-04-06 LAB — MAGNESIUM: Magnesium: 2 mg/dL (ref 1.7–2.4)

## 2019-04-06 LAB — PROCALCITONIN: Procalcitonin: 0.2 ng/mL

## 2019-04-06 LAB — TROPONIN I (HIGH SENSITIVITY)
Troponin I (High Sensitivity): 1220 ng/L (ref <18)
Troponin I (High Sensitivity): 1045 ng/L (ref <18)

## 2019-04-06 MED ORDER — ADULT MULTIVITAMIN W/MINERALS CH
1.0000 | ORAL_TABLET | Freq: Every day | ORAL | Status: DC
  Administered 2019-04-06 – 2019-04-08 (×3): 1 via ORAL
  Filled 2019-04-06 (×3): qty 1

## 2019-04-06 MED ORDER — SODIUM CHLORIDE 0.9 % IV SOLN
2.0000 g | Freq: Three times a day (TID) | INTRAVENOUS | Status: DC
  Administered 2019-04-06 – 2019-04-08 (×7): 2 g via INTRAVENOUS
  Filled 2019-04-06 (×10): qty 2

## 2019-04-06 MED ORDER — OMEGA-3-ACID ETHYL ESTERS 1 G PO CAPS
1.0000 g | ORAL_CAPSULE | Freq: Every day | ORAL | Status: DC
  Administered 2019-04-06 – 2019-04-08 (×3): 1 g via ORAL
  Filled 2019-04-06 (×3): qty 1

## 2019-04-06 MED ORDER — SODIUM CHLORIDE 0.9% FLUSH: 3.0000 mL | INTRAVENOUS | Status: DC | PRN

## 2019-04-06 MED ORDER — GLUCOSAMINE-CHONDROITIN 500-400 MG PO CAPS: ORAL_CAPSULE | Freq: Every day | ORAL | Status: DC

## 2019-04-06 MED ORDER — GUAIFENESIN ER 600 MG PO TB12
600.0000 mg | ORAL_TABLET | Freq: Two times a day (BID) | ORAL | Status: DC
  Administered 2019-04-06 – 2019-04-08 (×5): 600 mg via ORAL
  Filled 2019-04-06 (×5): qty 1

## 2019-04-06 MED ORDER — TAMSULOSIN HCL 0.4 MG PO CAPS
0.4000 mg | ORAL_CAPSULE | Freq: Every day | ORAL | Status: DC
  Administered 2019-04-06 – 2019-04-08 (×3): 0.4 mg via ORAL
  Filled 2019-04-06 (×3): qty 1

## 2019-04-06 MED ORDER — RISAQUAD PO CAPS
1.0000 | ORAL_CAPSULE | Freq: Every day | ORAL | Status: DC
  Administered 2019-04-06 – 2019-04-08 (×3): 1 via ORAL
  Filled 2019-04-06 (×3): qty 1

## 2019-04-06 MED ORDER — AMLODIPINE BESYLATE 5 MG PO TABS
5.0000 mg | ORAL_TABLET | Freq: Every day | ORAL | Status: DC
  Administered 2019-04-06 – 2019-04-08 (×3): 5 mg via ORAL
  Filled 2019-04-06 (×3): qty 1

## 2019-04-06 MED ORDER — COENZYME Q10 200 MG PO CAPS: 400.0000 mg | ORAL_CAPSULE | Freq: Every day | ORAL | Status: DC

## 2019-04-06 MED ORDER — SODIUM CHLORIDE 0.9 % IV SOLN: 250.0000 mL | INTRAVENOUS | Status: DC | PRN

## 2019-04-06 MED ORDER — IRBESARTAN 300 MG PO TABS
300.0000 mg | ORAL_TABLET | Freq: Every day | ORAL | Status: DC
  Administered 2019-04-06 – 2019-04-08 (×3): 300 mg via ORAL
  Filled 2019-04-06 (×3): qty 1

## 2019-04-06 MED ORDER — POTASSIUM CHLORIDE 20 MEQ PO PACK
20.0000 meq | PACK | Freq: Every day | ORAL | Status: DC
  Administered 2019-04-06 – 2019-04-08 (×3): 20 meq via ORAL
  Filled 2019-04-06 (×3): qty 1

## 2019-04-06 MED ORDER — RESVERATROL 100 MG PO CAPS: 100.0000 mg | ORAL_CAPSULE | Freq: Every day | ORAL | Status: DC

## 2019-04-06 MED ORDER — SODIUM CHLORIDE 0.9% FLUSH
3.0000 mL | Freq: Two times a day (BID) | INTRAVENOUS | Status: DC
  Administered 2019-04-06 – 2019-04-08 (×5): 3 mL via INTRAVENOUS

## 2019-04-06 MED ORDER — OMEGA 3 1200 MG PO CAPS: ORAL_CAPSULE | Freq: Every day | ORAL | Status: DC

## 2019-04-06 MED ORDER — ACETAMINOPHEN 500 MG PO TABS: 1000.0000 mg | ORAL_TABLET | Freq: Four times a day (QID) | ORAL | Status: DC | PRN

## 2019-04-06 MED ORDER — ALLOPURINOL 300 MG PO TABS
300.0000 mg | ORAL_TABLET | Freq: Every day | ORAL | Status: DC
  Administered 2019-04-06 – 2019-04-08 (×3): 300 mg via ORAL
  Filled 2019-04-06 (×3): qty 1

## 2019-04-06 MED ORDER — CARVEDILOL 6.25 MG PO TABS
18.7500 mg | ORAL_TABLET | Freq: Two times a day (BID) | ORAL | Status: DC
  Administered 2019-04-06 – 2019-04-08 (×6): 18.75 mg via ORAL
  Filled 2019-04-06 (×2): qty 1
  Filled 2019-04-06: qty 2
  Filled 2019-04-06: qty 1
  Filled 2019-04-06: qty 2
  Filled 2019-04-06: qty 1

## 2019-04-06 MED ORDER — COLCHICINE 0.6 MG PO TABS
0.6000 mg | ORAL_TABLET | Freq: Every day | ORAL | Status: DC
  Administered 2019-04-06 – 2019-04-08 (×3): 0.6 mg via ORAL
  Filled 2019-04-06 (×3): qty 1

## 2019-04-06 MED ORDER — IOHEXOL 350 MG/ML SOLN: 75.0000 mL | Freq: Once | INTRAVENOUS | Status: DC | PRN

## 2019-04-06 MED ORDER — RIVAROXABAN 20 MG PO TABS
20.0000 mg | ORAL_TABLET | Freq: Every day | ORAL | Status: DC
  Administered 2019-04-06 – 2019-04-08 (×3): 20 mg via ORAL
  Filled 2019-04-06 (×3): qty 1

## 2019-04-06 MED ORDER — FUROSEMIDE 10 MG/ML IJ SOLN
20.0000 mg | Freq: Two times a day (BID) | INTRAMUSCULAR | Status: DC
  Administered 2019-04-06 – 2019-04-08 (×4): 20 mg via INTRAVENOUS
  Filled 2019-04-06 (×2): qty 2
  Filled 2019-04-06: qty 4
  Filled 2019-04-06: qty 2

## 2019-04-06 MED ORDER — SODIUM CHLORIDE 0.9% FLUSH
3.0000 mL | Freq: Once | INTRAVENOUS | Status: AC
  Administered 2019-04-06: 3 mL via INTRAVENOUS

## 2019-04-06 MED ORDER — PROBIOTIC DAILY PO CAPS: ORAL_CAPSULE | Freq: Every day | ORAL | Status: DC

## 2019-04-06 MED ORDER — MULTIVITAMIN PO TABS: ORAL_TABLET | Freq: Every day | ORAL | Status: DC

## 2019-04-06 MED ORDER — DOFETILIDE 125 MCG PO CAPS
250.0000 ug | ORAL_CAPSULE | Freq: Two times a day (BID) | ORAL | Status: DC
  Administered 2019-04-06 – 2019-04-08 (×4): 250 ug via ORAL
  Filled 2019-04-06 (×2): qty 2
  Filled 2019-04-06: qty 1
  Filled 2019-04-06 (×2): qty 2
  Filled 2019-04-06: qty 1

## 2019-04-06 NOTE — Progress Notes (Signed)
  Patient seen and evaluated, chart reviewed, please see EMR for updated orders. Please see full H&P dictated by admitting physician Dr. Criss Alvine for same date of service.    Brief Summary 68 y.o. male with medical history significant for HTN, gout, BPH, thyroid nodule, chronic persistent atrial fibrillation/flutter, had an ablation for A. fib on 04/04/2019 readmitted 04/06/2019 with acute hypoxic respiratory failure and productive cough  A/p 1)Acute Hypoxic Respiratory Failure--- appears to be resolving--- multifactorial in the setting of CHF and possibly right-sided pneumonia -Treat underlying etiology -May use oxygen as needed  2) atypical chest pain with elevated troponins in the setting of recent ablation--echo noted as below, per cardiologist Dr. 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 fib/flutter -status post prior cardioversion x 2  (11/2017 and 01/14/19)  and subsequent Ablation on 04/04/2019--- concerns about prolonged QT -Per cardiology team okay to continue Tikosyn , Coreg 18.75 mg twice daily and Xarelto  6)Possible right-sided pneumonia/HCAP--- continue cefepime for possible HCAP, check MRSA PCR -Cough is productive, be judicious with bronchodilators to avoid tachycardia -give Mucinex  7)PFO on CT Coronary--- no syncope or CVA  8)HTN--stable, continue Coreg 18.75 mg twice daily, amlodipine 5 mg daily, AV pro 300 mg daily,  -Patient's wife at bedside, questions answered and plan of care discussed   Patient is refusing transfer to [facility] telemetry bed --- no beds at [facility] at this time  Roxan Hockey, MD

## 2019-04-06 NOTE — Consult Note (Addendum)
Cardiology Consultation:   Patient ID: [name] MRN: [id number]; DOB: 1950-11-10  Admit date: 04/06/2019 Date of Consult: 04/06/2019  Primary Care Provider: Glenda Chroman, MD Primary Cardiologist: Kate Sable, MD  Primary Electrophysiologist:  Cristopher Peru, MD    Patient Profile:   [name] is a 68 y.o. male with a hx of persistent atrial fibrillation/flutter with prior cardioversion and recent ablation 04/04/19 and  HTN who is being seen today for the evaluation of SOB and CP at the request of Dr. Denton Brick.  Hx of cardioversion on 11/2017 with return to NSR and most recently 01/14/2019. Due to symptomatic break through episodes, he underwent ablation on 04/04/2019.   Recent CT cardiac with morphology 03/30/2019: IMPRESSION: 1. There is normal pulmonary vein drainage into the left atrium with ostial measurements above.  2. There is no thrombus in the left atrial appendage.  3. The esophagus runs in the left atrial midline and is in close proximity to the LLPV ostium.  4. Severely dilated left atrium.  5. IAS bows into the RA consistent with increased LA pressure with likely PFO.  6. Coronary calcium score of 18 which is 55th percentile for age/sex.   History of Present Illness:   [name] felt weakness after his ablation.  He did had mild fluttering episode on Tuesday night.  Yesterday afternoon he started noticing some dyspnea on exertion which got worsened in evening.  He  had severe shortness of breath at laying down.  He also reported chest soreness at lower sternal area.  Some cough.  No fever or congestion.  Compliant with his medication.  Due to worsening dyspnea at laying down he came to ER for further evaluation.  Hs-Troponin 1220>>>1045.  Chest x-ray showed new right basilar atelectasis with small effusion.  Covid pending.  He was tested negative before his ablation.  Normal BNP.   He reports he has chronic lower extremity edema  and wears compression stocking.  Echo this morning showed   1. Left ventricular ejection fraction, by visual estimation, is 45 to 50%. The left ventricle has mildly decreased function. There is mildly increased left ventricular hypertrophy.  2. The left ventricle demonstrates global hypokinesis.  3. Left ventricular diastolic parameters are consistent with Grade I diastolic dysfunction (impaired relaxation).  4. Global right ventricle has normal systolic function.The right ventricular size is normal. No increase in right ventricular wall thickness.  5. Left atrial size was mildly dilated.  6. Right atrial size was normal.  7. Presence of pericardial fat pad.  8. The pericardial effusion is circumferential.  9. Trivial pericardial effusion is present. 10. The mitral valve is grossly normal. Mild mitral valve regurgitation. No evidence of mitral stenosis. 11. The tricuspid valve is grossly normal. Tricuspid valve regurgitation is trivial. 12. The aortic valve is tricuspid. Aortic valve regurgitation is not visualized. No evidence of aortic valve sclerosis or stenosis. 13. The pulmonic valve was grossly normal. Pulmonic valve regurgitation is trivial. 14. TR signal is inadequate for assessing pulmonary artery systolic pressure.  In comparison to the previous echocardiogram(s): Prior examinations were reviewed in a side by side comparison of images. Normal sinus rhythm is now present. No significant change from 12/12/2017.  Heart Pathway Score:     Past Medical History:  Diagnosis Date   Arthritis    "knuckles maybe" (02/28/2018)   Atrial fib/flutter, transient    a. s/p TEE-guided DCCV on 12/14/2017 with return to NSR.    BPH (benign prostatic hyperplasia) 12/11/2017  Essential hypertension    Gout    "I take RX qd" (02/28/2018)   Neuropathy    Pneumonia 11/2017   Thyroid nodule    Benign    Past Surgical History:  Procedure Laterality Date   ATRIAL FIBRILLATION ABLATION  N/A 04/04/2019   Procedure: ATRIAL FIBRILLATION ABLATION;  Surgeon: Constance Haw, MD;  Location: [facility];  Service: Cardiovascular;  Laterality: N/A;   BACK SURGERY     BIOPSY THYROID     CARDIOVERSION N/A 12/14/2017   Procedure: CARDIOVERSION;  Surgeon: Satira Sark, MD;  Location: [facility];  Service: Cardiovascular;  Laterality: N/A;   CARDIOVERSION N/A 01/14/2019   Procedure: CARDIOVERSION;  Surgeon: Constance Haw, MD;  Location: [place];  Service: Cardiovascular;  Laterality: N/A;   CATARACT EXTRACTION W/ INTRAOCULAR LENS  IMPLANT, BILATERAL Bilateral 2006-2010   "right-left"   COLONOSCOPY  2012   IRRIGATION AND DEBRIDEMENT SEBACEOUS CYST     LUMBAR LAMINECTOMY N/A 01/20/2013   Procedure: MICRODISCECTOMY LUMBAR LAMINECTOMY;  Surgeon: Marybelle Killings, MD;  Location: [facility];  Service: Orthopedics;  Laterality: N/A;  L4-5 Decompression   PILONIDAL CYST DRAINAGE     TEE WITHOUT CARDIOVERSION N/A 12/14/2017   Procedure: TRANSESOPHAGEAL ECHOCARDIOGRAM (TEE) WITH PROPOFOL;  Surgeon: Satira Sark, MD;  Location: [facility];  Service: Cardiovascular;  Laterality: N/A;     Inpatient Medications: Scheduled Meds:  acidophilus  1 capsule Oral Daily   allopurinol  300 mg Oral Daily   amLODipine  5 mg Oral Daily   carvedilol  18.75 mg Oral BID WC   dofetilide  250 mcg Oral BID   irbesartan  300 mg Oral Daily   multivitamin with minerals  1 tablet Oral Daily   omega-3 acid ethyl esters  1 g Oral Daily   potassium chloride  20 mEq Oral Daily   rivaroxaban  20 mg Oral Q supper   sodium chloride flush  3 mL Intravenous Q12H   tamsulosin  0.4 mg Oral Daily   Continuous Infusions:  sodium chloride     ceFEPime (MAXIPIME) IV Stopped (04/06/19 0815)   PRN Meds: sodium chloride, acetaminophen, iohexol, sodium chloride flush  Allergies:    Allergies  Allergen Reactions   Azithromycin     QT WAVE    Social History:   Social History    Socioeconomic History   Marital status: Married    Spouse name: Not on file   Number of children: 3   Years of education: Not on file   Highest education level: Not on file  Occupational History   Not on file  Social Needs   Financial resource strain: Not on file   Food insecurity    Worry: Not on file    Inability: Not on file   Transportation needs    Medical: Not on file    Non-medical: Not on file  Tobacco Use   Smoking status: Never Smoker   Smokeless tobacco: Never Used  Substance and Sexual Activity   Alcohol use: Never    Alcohol/week: 0.0 standard drinks    Frequency: Never   Drug use: Never   Sexual activity: Not Currently  Lifestyle   Physical activity    Days per week: Not on file    Minutes per session: Not on file   Stress: Not on file  Relationships   Social connections    Talks on phone: Not on file    Gets together: Not on file    Attends  religious service: Not on file    Active member of club or organization: Not on file    Attends meetings of clubs or organizations: Not on file    Relationship status: Not on file   Intimate partner violence    Fear of current or ex partner: Not on file    Emotionally abused: Not on file    Physically abused: Not on file    Forced sexual activity: Not on file  Other Topics Concern   Not on file  Social History Narrative   Not on file    Family History:   Family History  Problem Relation Age of Onset   Lung cancer Father    Cancer - Lung Father    Atrial fibrillation Mother    Dementia Mother    Transient ischemic attack Mother    Hypertension Other    Liver cancer Brother      ROS:  Please see the history of present illness.  All other ROS reviewed and negative.     Physical Exam/Data:   Vitals:   04/06/19 0800 04/06/19 0830 04/06/19 0900 04/06/19 0930  BP: (!) 142/68 135/62 139/75 131/72  Pulse: 76 73 72 77  Resp: (!) 35 (!) 30 (!) 28 (!) 25  Temp:      TempSrc:       SpO2: 94% 94% 94% 95%  Weight:      Height:        Intake/Output Summary (Last 24 hours) at 04/06/2019 1048 Last data filed at 04/06/2019 0815 Gross per 24 hour  Intake 100 ml  Output --  Net 100 ml   Last 3 Weights 04/06/2019 04/04/2019 02/27/2019  Weight (lbs) 276 lb 276 lb 275 lb 14.4 oz  Weight (kg) 125.193 kg 125.193 kg 125.147 kg     Body mass index is 35.44 kg/m.  General:  Well nourished, well developed, in no acute distress HEENT: normal Lymph: no adenopathy Neck: no JVD Endocrine:  No thryomegaly Vascular: No carotid bruits; FA pulses 2+ bilaterally without bruits  Cardiac:  normal S1, S2; RRR; no murmur  Lungs: Diminished breath sound on right side base Abd: soft, nontender, no hepatomegaly  Ext: Trace to 1+ bilateral lower extremity edema, wearing compression stockings Musculoskeletal:  No deformities, BUE and BLE strength normal and equal Skin: warm and dry  Neuro:  CNs 2-12 intact, no focal abnormalities noted Psych:  Normal affect   EKG:  The EKG was personally reviewed and demonstrates: Sinus rhythm at rate of 78 bpm, nonspecific T wave inversion in inferior lead   Relevant CV Studies: As above  Laboratory Data:  High Sensitivity Troponin:   Recent Labs  Lab 04/06/19 0223 04/06/19 0442  TROPONINIHS 1,220* 1,045*     Chemistry Recent Labs  Lab 04/06/19 0223  NA 137  K 3.7  CL 104  CO2 24  GLUCOSE 145*  BUN 22  CREATININE 1.02  CALCIUM 8.2*  GFRNONAA >60  GFRAA >60  ANIONGAP 9    No results for input(s): PROT, ALBUMIN, AST, ALT, ALKPHOS, BILITOT in the last 168 hours. Hematology Recent Labs  Lab 04/06/19 0223  WBC 11.7*  RBC 4.07*  HGB 12.3*  HCT 37.6*  MCV 92.4  MCH 30.2  MCHC 32.7  RDW 13.1  PLT 120*   BNP Recent Labs  Lab 04/06/19 0223  BNP 61.5     Radiology/Studies:  Dg Chest 2 View  Result Date: 04/06/2019 CLINICAL DATA:  Shortness of breath, history of recent cardiac ablation  EXAM: CHEST - 2 VIEW  COMPARISON:  01/13/2019 chest x-ray, CT from 03/30/2019 FINDINGS: Cardiac shadow is stable. Right basilar atelectasis and small effusion is noted. This is new from the prior CT examination. Left lung is clear. Mild vascular congestion is noted. No bony abnormality is noted. IMPRESSION: Right basilar atelectasis with small effusion. This is new from the prior CT examination. Electronically Signed   By: Inez Catalina M.D.   On: 04/06/2019 02:39    Assessment and Plan:   1. Chest pain, shortness of breath, orthopnea, elevated troponin - Symptoms started yesterday.  He has chest soreness but his main complaint is shortness of breath and orthopnea.  Symptoms worse with laying down.  Some cough. - Troponin trending down 1220>>>1045. Likely demand due to recent ablation versus pericarditis. -Recent CT coronary showed calcium score of 18 which is 55th percentile for age/sex. >>  May request FFR study if concern for ACS. -Given pleuritic component suspicious for pericarditis (spoke with Dr. Curt Bears over phone and he is agree with possible pericarditis).  Chest x-ray with right small pleural effusion.  Echocardiogram showed LV function of 45 to [id number], grade 1 diastolic dysfunction and circumferential pericardial effusion.Vital stable.   2.  Persistent atrial fibrillation -Prior cardioversion x2.  Recently underwent ablation 04/04/2019 -Maintaining sinus rhythm -Continue Xarelto and flecainide  3.  Prolonged QT - Initial EKG on arrival showed QT/QTc 384/426 ms however repeat EKG showed QT/QTc 562/640 ms - Will repeat EKG  4 ? PFO on CT coronary - No syncope or CVA  5. HTN - BP Stable. Continue current medicatons  6. Chronic LE edema - Echo with grade 1 DD. He wears compression stocking. BNP normal    Dr. Stanford Breed to see.   For questions or updates, please contact [place] Please consult [email or web address] for contact info under     SignedLeanor Kail, PA  04/06/2019 10:48 AM   As  above, patient seen and examined.  Briefly he is a 68 year old male with past medical history of hypertension, benign prostatic hypertrophy and recent atrial fibrillation ablation for evaluation of chest pain and dyspnea.  Patient underwent ablation 2 days ago.  After the procedure he noticed some fatigue.  Yesterday evening he described pain in the epigastric and neck area that increased with coughing.  He then developed some dyspnea on exertion and orthopnea and presented for further evaluation.  He is not having any other chest pain other than when he coughs.  There is no chest pain with lying flat.  He denies fevers or chills.  One episode of cough productive of brownish sputum but not at present.  No hemoptysis.  No dysphagia. Electrocardiogram shows sinus rhythm, left ventricular hypertrophy, subtle ST elevation lateral leads. Chest x-ray shows right lower lobe atelectasis and effusion. Troponin I 220 and 1045.  White blood cell count 11.7.  Echocardiogram shows ejection fraction 45 to 50% and trivial pericardial effusion.  Mild left atrial enlargement.  1 chest pain-dyspnea-etiology unclear.  No pneumothorax noted on chest x-ray.  No large pericardial effusion on echocardiogram.  No esophageal symptoms.  There is note of right lower lobe atelectasis/effusion.  Question pleuropericarditis.  We will treat with colchicine 0.6 mg daily.  I will gently diurese given symptoms of orthopnea.  No ongoing infectious symptoms to explain right lower lobe atelectasis and right effusion.  2 paroxysmal atrial fibrillation-status post ablation-patient remains in sinus rhythm.  QT is normal.  Continue preadmission dose of Tikosyn.  Continue beta-blocker and Xarelto.  3 hypertension-continue preadmission blood pressure medications.  Follow and adjust as needed.  4 elevated troponin-likely secondary to recent ablation.  No plans for further ischemia evaluation.  Kirk Ruths, MD

## 2019-04-06 NOTE — ED Triage Notes (Signed)
Pt had a cardiac ablation on Tuesday for Afib. Pt reports sob that started yesterday that has gotten worse today. Pt reports being sore all over. Pt reports pain with deep inspiration. Orthopnea. Denies cp. EKG NSR.

## 2019-04-06 NOTE — ED Provider Notes (Signed)
Emergency Department Provider Note   I have reviewed the triage vital signs and the nursing notes.   HISTORY  Chief Complaint Shortness of Breath   HPI [name] is a 68 y.o. male with multiple medical problems document below who presents the emergency department today with dyspnea.  Patient states that he had cardiac ablation for atrial fibrillation without complication on the [id number].  Patient states ever since then he has felt very weak, fatigued and has progressively worsening dyspnea on exertion.  Patient states that tonight it got so bad he felt he needed to be evaluated.  He states that it does seem to be worse when lying flat now. No fever or productive cough. At baseline LE edema. No CP.   No other associated or modifying symptoms.    Past Medical History:  Diagnosis Date   Arthritis    "knuckles maybe" (02/28/2018)   Atrial fib/flutter, transient    a. s/p TEE-guided DCCV on 12/14/2017 with return to NSR.    BPH (benign prostatic hyperplasia) 12/11/2017   Essential hypertension    Gout    "I take RX qd" (02/28/2018)   Neuropathy    Pneumonia 11/2017   Thyroid nodule    Benign    Patient Active Problem List   Diagnosis Date Noted   Status post ablation of atrial fibrillation 04/06/2019   Atrial fibrillation ([place]) 01/14/2019   Atrial fibrillation with rapid ventricular response ([place]) 01/13/2019   Visit for monitoring Tikosyn therapy 02/28/2018   CAD (coronary artery disease)    Atrial fibrillation with RVR ([place]) Q000111Q   Chronic systolic CHF (congestive heart failure) ([place]) 12/11/2017   HTN (hypertension) 12/11/2017   Gout 12/11/2017   BPH (benign prostatic hyperplasia) 12/11/2017   Periodic limb movement 10/31/2015   Numbness 10/31/2015   Restless leg 10/31/2015   Polyneuropathy 10/31/2015   Anemia, iron deficiency 10/31/2015   HNP (herniated nucleus pulposus), lumbar 01/20/2013    Class: Diagnosis of   Abnormal  echocardiogram 02/23/2012    Past Surgical History:  Procedure Laterality Date   ATRIAL FIBRILLATION ABLATION N/A 04/04/2019   Procedure: ATRIAL FIBRILLATION ABLATION;  Surgeon: Constance Haw, MD;  Location: [facility];  Service: Cardiovascular;  Laterality: N/A;   BACK SURGERY     BIOPSY THYROID     CARDIOVERSION N/A 12/14/2017   Procedure: CARDIOVERSION;  Surgeon: Satira Sark, MD;  Location: [facility];  Service: Cardiovascular;  Laterality: N/A;   CARDIOVERSION N/A 01/14/2019   Procedure: CARDIOVERSION;  Surgeon: Constance Haw, MD;  Location: [place];  Service: Cardiovascular;  Laterality: N/A;   CATARACT EXTRACTION W/ INTRAOCULAR LENS  IMPLANT, BILATERAL Bilateral 2006-2010   "right-left"   COLONOSCOPY  2012   IRRIGATION AND DEBRIDEMENT SEBACEOUS CYST     LUMBAR LAMINECTOMY N/A 01/20/2013   Procedure: MICRODISCECTOMY LUMBAR LAMINECTOMY;  Surgeon: Marybelle Killings, MD;  Location: [facility];  Service: Orthopedics;  Laterality: N/A;  L4-5 Decompression   PILONIDAL CYST DRAINAGE     TEE WITHOUT CARDIOVERSION N/A 12/14/2017   Procedure: TRANSESOPHAGEAL ECHOCARDIOGRAM (TEE) WITH PROPOFOL;  Surgeon: Satira Sark, MD;  Location: [facility];  Service: Cardiovascular;  Laterality: N/A;    Current Outpatient Rx   Order #: [id number] Class: Historical Med   Order #: [id number] Class: Historical Med   Order #: [id number] Class: Normal   Order #: [id number] Class: Historical Med   Order #: [id number] Class: Historical Med   Order #: [id number] Class: Historical Med   Order #: [id number] Class: Historical Med  Order #: [id number] Class: Historical Med   Order #: [id number] Class: Normal   Order #: [id number] Class: Historical Med   Order #: [id number] Class: Historical Med   Order #: [id number] Class: Historical Med   Order #: [id number] Class: Historical Med   Order #: [id number] Class: Normal   Order #: [id number] Class: Historical Med   Order #: [id number] Class: Historical  Med   Order #: [id number] Class: Normal   Order #: [id number] Class: Historical Med   Order #: [id number] Class: Historical Med   Order #: [id number] Class: Historical Med   Order #: [id number] Class: Normal    Allergies Azithromycin  Family History  Problem Relation Age of Onset   Lung cancer Father    Cancer - Lung Father    Atrial fibrillation Mother    Dementia Mother    Transient ischemic attack Mother    Hypertension Other    Liver cancer Brother     Social History Social History   Tobacco Use   Smoking status: Never Smoker   Smokeless tobacco: Never Used  Substance Use Topics   Alcohol use: Never    Alcohol/week: 0.0 standard drinks    Frequency: Never   Drug use: Never    Review of Systems  All other systems negative except as documented in the HPI. All pertinent positives and negatives as reviewed in the HPI. ____________________________________________   PHYSICAL EXAM:  VITAL SIGNS: ED Triage Vitals  Enc Vitals Group     BP 04/06/19 0210 133/66     Pulse Rate 04/06/19 0210 85     Resp 04/06/19 0210 (!) 25     Temp 04/06/19 0210 98.1 F (36.7 C)     Temp Source 04/06/19 0210 Oral     SpO2 04/06/19 0210 95 %     Weight 04/06/19 0210 276 lb (125.2 kg)     Height 04/06/19 0210 6\' 2"  (1.88 m)    Constitutional: Alert and oriented. Well appearing and in no acute distress. Eyes: Conjunctivae are normal. PERRL. EOMI. Head: Atraumatic. Nose: No congestion/rhinnorhea. Mouth/Throat: Mucous membranes are moist.  Oropharynx non-erythematous. Neck: No stridor.  No meningeal signs.   Cardiovascular: Normal rate, regular rhythm. Good peripheral circulation. Grossly normal heart sounds.   Respiratory: tachypneic respiratory effort.  No retractions. Lungs CTAB. Gastrointestinal: Soft and nontender. No distention.  Musculoskeletal: No lower extremity tenderness with mild edema around ankles. No gross deformities of extremities. Neurologic:  Normal speech  and language. No gross focal neurologic deficits are appreciated.  Skin:  Skin is warm, dry and intact. No rash noted.  ____________________________________________   LABS (all labs ordered are listed, but only abnormal results are displayed)  Labs Reviewed  BASIC METABOLIC PANEL - Abnormal; Notable for the following components:      Result Value   Glucose, Bld 145 (*)    Calcium 8.2 (*)    All other components within normal limits  CBC - Abnormal; Notable for the following components:   WBC 11.7 (*)    RBC 4.07 (*)    Hemoglobin 12.3 (*)    HCT 37.6 (*)    Platelets 120 (*)    All other components within normal limits  TROPONIN I (HIGH SENSITIVITY) - Abnormal; Notable for the following components:   Troponin I (High Sensitivity) 1,220 (*)    All other components within normal limits  TROPONIN I (HIGH SENSITIVITY) - Abnormal; Notable for the following components:   Troponin I (High Sensitivity) 1,045 (*)    All other components within normal limits  SARS CORONAVIRUS 2 (TAT 6-24 HRS)  BRAIN NATRIURETIC PEPTIDE   ____________________________________________  EKG   EKG Interpretation  Date/Time:  Thursday April 06 2019 02:15:32 EST Ventricular Rate:  78 PR Interval:  148 QRS Duration: 96 QT Interval:  562 QTC Calculation: 640 R Axis:   0 Text Interpretation:  Critical Test Result: Long QTc Normal sinus rhythm Minimal voltage criteria for LVH, may be normal variant ( R in aVL ) Nonspecific T wave abnormality Prolonged QT Abnormal ECG Confirmed by Merrily Pew [phone]) on 04/06/2019 4:17:03 AM       ____________________________________________  RADIOLOGY  Dg Chest 2 View  Result Date: 04/06/2019 CLINICAL DATA:  Shortness of breath, history of recent cardiac ablation EXAM: CHEST - 2 VIEW COMPARISON:  01/13/2019 chest x-ray, CT from 03/30/2019 FINDINGS: Cardiac shadow is stable. Right basilar atelectasis and small effusion is noted. This is new from the prior CT  examination. Left lung is clear. Mild vascular congestion is noted. No bony abnormality is noted. IMPRESSION: Right basilar atelectasis with small effusion. This is new from the prior CT examination. Electronically Signed   By: Inez Catalina M.D.   On: 04/06/2019 02:39   ____________________________________________   INITIAL IMPRESSION / ASSESSMENT AND PLAN / ED COURSE  Worsening DOE and orthopnea. Seems c/w CHF exacerbation but exam not c/w same. D/w cardiology and troponin is normal but the dypsnea would not be. Main concerns would be pericarditis/myocarditis or tamponade. i'm also considering possible PE even though he is on xarelto as I don't have another obvious cause at this time.  Plan for CT and likely admission for further workup depending on results.   Planned for CT to eval for PE however not able to lie flat 2/2 orthopnea.   Concern for possible PE vs pericardial effusion vs myo/pericarditis, will admit to hospitalist for further workup and likely echo.     Pertinent labs & imaging results that were available during my care of the patient were reviewed by me and considered in my medical decision making (see chart for details).  ____________________________________________  FINAL CLINICAL IMPRESSION(S) / ED DIAGNOSES  Final diagnoses:  Dyspnea, unspecified type    MEDICATIONS GIVEN DURING THIS VISIT:  Medications  sodium chloride flush (NS) 0.9 % injection 3 mL (has no administration in time range)  iohexol (OMNIPAQUE) 350 MG/ML injection 75 mL (has no administration in time range)    NEW OUTPATIENT MEDICATIONS STARTED DURING THIS VISIT:  New Prescriptions   No medications on file    Note:  This note was prepared with assistance of Dragon voice recognition software. Occasional wrong-word or sound-a-like substitutions may have occurred due to the inherent limitations of voice recognition software.   Bashar Milam, Corene Cornea, MD 04/06/19 [phone]

## 2019-04-06 NOTE — H&P (Signed)
History and Physical    [name] [id number] DOB: Nov 26, 1950 DOA: 04/06/2019  PCP: Glenda Chroman, MD    Patient coming from: Home    Chief Complaint: Shortness of breath, orthopnea cannot lie down flat, atypical chest pain, sore chest  HPI: [name] is a 68 y.o. male with medical history significant for essential hypertension, gout, BPH, thyroid nodule, chronic persistent atrial fibrillation, had an ablation for A. fib Tuesday.  He was okay till Wednesday afternoon when he developed shortness of breath and some productive cough.  Brown in color per patient.   ED Course:  Patient with severe shortness of breath Emergency room physician tried to do CT scan to rule out PE not able to do it White count 11,000 respiratory rate 28 Chest x-ray right lower lobe infiltrate with small pleural effusion Lactic acid procalcitonin, pending Stat echocardiogram pending  Review of Systems: As per HPI otherwise 10 point review of systems negative.  Except shortness of breath and chest pain  Past Medical History:  Diagnosis Date  . Arthritis    "knuckles maybe" (02/28/2018)  . Atrial fib/flutter, transient    a. s/p TEE-guided DCCV on 12/14/2017 with return to [facility].   [name] BPH (benign prostatic hyperplasia) 12/11/2017  . Essential hypertension   . Gout    "I take RX qd" (02/28/2018)  . Neuropathy   . Pneumonia 11/2017  . Thyroid nodule    Benign    Past Surgical History:  Procedure Laterality Date  . ATRIAL FIBRILLATION ABLATION N/A 04/04/2019   Procedure: ATRIAL FIBRILLATION ABLATION;  Surgeon: Constance Haw, MD;  Location: [facility];  Service: Cardiovascular;  Laterality: N/A;  . BACK SURGERY    . BIOPSY THYROID    . CARDIOVERSION N/A 12/14/2017   Procedure: CARDIOVERSION;  Surgeon: Satira Sark, MD;  Location: [facility];  Service: Cardiovascular;  Laterality: N/A;  . CARDIOVERSION N/A 01/14/2019   Procedure: CARDIOVERSION;  Surgeon: Constance Haw, MD;  Location: [facility];  Service: Cardiovascular;  Laterality: N/A;  . CATARACT EXTRACTION W/ INTRAOCULAR LENS  IMPLANT, BILATERAL Bilateral 2006-2010   "right-left"  . COLONOSCOPY  2012  . IRRIGATION AND DEBRIDEMENT SEBACEOUS CYST    . LUMBAR LAMINECTOMY N/A 01/20/2013   Procedure: MICRODISCECTOMY LUMBAR LAMINECTOMY;  Surgeon: Marybelle Killings, MD;  Location: [facility];  Service: Orthopedics;  Laterality: N/A;  L4-5 Decompression  . PILONIDAL CYST DRAINAGE    . TEE WITHOUT CARDIOVERSION N/A 12/14/2017   Procedure: TRANSESOPHAGEAL ECHOCARDIOGRAM (TEE) WITH PROPOFOL;  Surgeon: Satira Sark, MD;  Location: [facility];  Service: Cardiovascular;  Laterality: N/A;     reports that he has never smoked. He has never used smokeless tobacco. He reports that he does not drink alcohol or use drugs.  Allergies  Allergen Reactions  . Azithromycin     QT WAVE    Family History  Problem Relation Age of Onset  . Lung cancer Father   . Cancer - Lung Father   . Atrial fibrillation Mother   . Dementia Mother   . Transient ischemic attack Mother   . Hypertension Other   . Liver cancer Brother      Prior to Admission medications   Medication Sig Start Date End Date Taking? Authorizing Provider  acetaminophen (TYLENOL) 500 MG tablet Take 1,000 mg by mouth every 6 (six) hours as needed (for pain.).    Yes [provider]  allopurinol (ZYLOPRIM) 300 MG tablet Take 300 mg by mouth daily.  Yes [provider]  amLODipine (NORVASC) 5 MG tablet TAKE ONE TABLET BY MOUTH DAILY. (LUNCH TIME) Patient taking differently: Take 5 mg by mouth daily.  02/03/19  Yes Herminio Commons, MD  Calcium Carb-Cholecalciferol (CALCIUM + D3 PO) Take 1 tablet by mouth daily at 3 pm.    Yes [provider]  carvedilol (COREG) 12.5 MG tablet Take 18.75 mg by mouth 2 (two) times daily with a meal.   Yes [provider]  cetirizine (ZYRTEC) 10 MG tablet Take 10 mg by mouth daily.     Yes [provider]  CINNAMON PO Take 350 mg by mouth daily at 3 pm.    Yes [provider]  Coenzyme Q10 200 MG capsule Take 400 mg by mouth daily in the afternoon.    Yes [provider]  dofetilide (TIKOSYN) 250 MCG capsule TAKE ONE CAPSULE BY MOUTH TWICE DAILY. Patient taking differently: Take 250 mcg by mouth 2 (two) times daily.  03/27/19  Yes Camnitz, Will Hassell Done, MD  Ferrous Gluconate-C-Folic Acid (IRON-C PO) Take 1 tablet by mouth daily.   Yes [provider]  Glucosamine-Chondroitin (GLUCOSAMINE CHONDR COMPLEX PO) Take 1 tablet by mouth daily.    Yes [provider]  LUTEIN-ZEAXANTHIN PO Take 2 tablets by mouth daily in the afternoon.    Yes [provider]  Multiple Vitamins-Minerals (MULTIVITAMIN PO) Take 1 tablet by mouth daily at 3 pm.    Yes [provider]  olmesartan (BENICAR) 40 MG tablet TAKE ONE TABLET BY MOUTH DAILY Patient taking differently: Take 40 mg by mouth daily.  12/27/18  Yes Herminio Commons, MD  Omega-3 Fatty Acids (OMEGA 3 PO) Take 1,350 mg by mouth daily.   Yes [provider]  PHOSPHATIDYLCHOLINE PO Take 2 tablets by mouth daily in the afternoon.    Yes [provider]  potassium chloride (KLOR-CON) 10 MEQ tablet TAKE ONE TABLET BY MOUTH TWICE DAILY. Patient taking differently: Take 20 mEq by mouth daily.  03/27/19  Yes Camnitz, Ocie Doyne, MD  Probiotic Product (PROBIOTIC DAILY PO) Take 2 tablets by mouth daily in the afternoon.    Yes [provider]  RESVERATROL 100 MG CAPS Take 100 mg by mouth daily at 3 pm.    Yes [provider]  tamsulosin (FLOMAX) 0.4 MG CAPS capsule Take 0.4 mg by mouth daily.  08/06/15  Yes [provider]  XARELTO 20 MG TABS tablet TAKE ONE TABLET BY MOUTH DAILY WITH LUNCH Patient taking differently: Take 20 mg by mouth daily with supper.  12/27/18  Yes Herminio Commons, MD    Physical Exam: Vitals:   04/06/19 0210  04/06/19 0406 04/06/19 0445 04/06/19 0700  BP: 133/66 (!) 145/68 132/60 (!) 146/72  Pulse: 85 73 72 73  Resp: (!) 25 (!) 29 (!) 25 (!) 30  Temp: 98.1 F (36.7 C) 97.8 F (36.6 C)    TempSrc: Oral Oral    SpO2: 95% 95% 96% 95%  Weight: 125.2 kg     Height: 6\' 2"  (1.88 m)       Constitutional: NAD, calm, comfortable Vitals:   04/06/19 0210 04/06/19 0406 04/06/19 0445 04/06/19 0700  BP: 133/66 (!) 145/68 132/60 (!) 146/72  Pulse: 85 73 72 73  Resp: (!) 25 (!) 29 (!) 25 (!) 30  Temp: 98.1 F (36.7 C) 97.8 F (36.6 C)    TempSrc: Oral Oral    SpO2: 95% 95% 96% 95%  Weight: 125.2 kg  Height: 6\' 2"  (1.88 m)      Eyes: PERRL, lids and conjunctivae normal ENMT: Mucous membranes are moist. Posterior pharynx clear of any exudate or lesions.Normal dentition.  Neck: normal, supple, no masses, no thyromegaly Respiratory: clear to auscultation bilaterally, no wheezing, no crackles. Normal respiratory effort. No accessory muscle use.  Cardiovascular: Regular rate and rhythm, no murmurs / rubs / gallops. No extremity edema. 2+ pedal pulses. No carotid bruits.  Abdomen: no tenderness, no masses palpated. No hepatosplenomegaly. Bowel sounds positive.  Musculoskeletal: no clubbing / cyanosis. No joint deformity upper and lower extremities. Good ROM, no contractures. Normal muscle tone.  Skin: no rashes, lesions, ulcers. No induration Neurologic: CN 2-12 grossly intact. Sensation intact, DTR normal. Strength 5/5 in all 4.  Psychiatric: Normal judgment and insight. Alert and oriented x 3. Normal mood.    Labs on Admission: I have personally reviewed following labs and imaging studies  CBC: Recent Labs  Lab 04/06/19 0223  WBC 11.7*  HGB 12.3*  HCT 37.6*  MCV 92.4  PLT 123456*   Basic Metabolic Panel: Recent Labs  Lab 04/06/19 0223  NA 137  K 3.7  CL 104  CO2 24  GLUCOSE 145*  BUN 22  CREATININE 1.02  CALCIUM 8.2*   GFR: Estimated Creatinine Clearance: 94.7 mL/min (by C-G  formula based on SCr of 1.02 mg/dL). Liver Function Tests: No results for input(s): AST, ALT, ALKPHOS, BILITOT, PROT, ALBUMIN in the last 168 hours. No results for input(s): LIPASE, AMYLASE in the last 168 hours. No results for input(s): AMMONIA in the last 168 hours. Coagulation Profile: No results for input(s): INR, PROTIME in the last 168 hours. Cardiac Enzymes: No results for input(s): CKTOTAL, CKMB, CKMBINDEX, TROPONINI in the last 168 hours. BNP (last 3 results) No results for input(s): PROBNP in the last 8760 hours. HbA1C: No results for input(s): HGBA1C in the last 72 hours. CBG: No results for input(s): GLUCAP in the last 168 hours. Lipid Profile: No results for input(s): CHOL, HDL, LDLCALC, TRIG, CHOLHDL, LDLDIRECT in the last 72 hours. Thyroid Function Tests: No results for input(s): TSH, T4TOTAL, FREET4, T3FREE, THYROIDAB in the last 72 hours. Anemia Panel: No results for input(s): VITAMINB12, FOLATE, FERRITIN, TIBC, IRON, RETICCTPCT in the last 72 hours. Urine analysis:    Component Value Date/Time   COLORURINE YELLOW 12/11/2017 1320   APPEARANCEUR CLEAR 12/11/2017 1320   LABSPEC 1.009 12/11/2017 1320   PHURINE 8.0 12/11/2017 1320   GLUCOSEU NEGATIVE 12/11/2017 1320   HGBUR NEGATIVE 12/11/2017 1320   BILIRUBINUR NEGATIVE 12/11/2017 1320   KETONESUR NEGATIVE 12/11/2017 1320   PROTEINUR NEGATIVE 12/11/2017 1320   NITRITE NEGATIVE 12/11/2017 1320   LEUKOCYTESUR NEGATIVE 12/11/2017 1320    Radiological Exams on Admission: Dg Chest 2 View  Result Date: 04/06/2019 CLINICAL DATA:  Shortness of breath, history of recent cardiac ablation EXAM: CHEST - 2 VIEW COMPARISON:  01/13/2019 chest x-ray, CT from 03/30/2019 FINDINGS: Cardiac shadow is stable. Right basilar atelectasis and small effusion is noted. This is new from the prior CT examination. Left lung is clear. Mild vascular congestion is noted. No bony abnormality is noted. IMPRESSION: Right basilar atelectasis with  small effusion. This is new from the prior CT examination. Electronically Signed   By: Inez Catalina M.D.   On: 04/06/2019 02:39    EKG: Independently reviewed.  Normal sinus no acute ST-T changes  Assessment/Plan Principal Problem:   Acute respiratory failure with hypoxemia (HCC) Active Problems:   Restless leg   Chronic systolic  CHF (congestive heart failure) (HCC)   HTN (hypertension)   Gout   BPH (benign prostatic hyperplasia)   CAD (coronary artery disease)   Atrial fibrillation (HCC)   Status post ablation of atrial fibrillation   CAP (community acquired pneumonia)   Chest pain of uncertain etiology   Acute respiratory failure with hypoxemia Patient with shortness of breath started Wednesday Patient had a procedure ablation for A. fib Tuesday Patient was intubated for the procedure He has severe orthopnea Cannot lie flat Unable to do CT angio to rule out PE Chest x-ray shows right lower lobe infiltrate with small pleural effusion Patient was tested for Covid before the procedure Plan start empirically on antibiotic for possible pneumonia Stat cardiac ultrasound I talked to cardiology To rule out pericardial effusion, right ventricular strain secondary to PE Plan repeat COVID-19 test, stat echo, cardiology consult, empirically antibiotic, ABG stat, lactic acid, procalcitonin, blood cultures, sputum culture, try to do CT angios rule out PE Echo to rule out myocarditis tamponade, pericarditis, wall motion abnormality ejection fraction.  Chest pain Patient has chest discomfort Electrocardiogram normal sinus no acute ST-T changes Troponin elevated 1045, 1202  Plan serial troponin Emergency room discussed the case with cardiology Who thinks that elevated troponin secondary to procedure, ablation  Atrial fibrillation History of cardioversion Now in sinus post ablation On Xarelto  Essential hypertension  resume home medication/Tikosin, amlodipine  Congestive heart  failure Preserved ejection fraction BNP normal Plan cardiac ultrasound  Gout Allopurinol   DVT prophylaxis: On Xarelto Code Status: Full code Family Communication: Wife was in the room Disposition Plan: Discharge home Consults called: Cardiology was called by emergency room and by me Admission status: Full admit   Linard Millers Sylvanna Burggraf MD [facility]  If 7PM-7AM, please contact night-coverage [email or web address]   04/06/2019, 7:31 AM

## 2019-04-06 NOTE — ED Notes (Signed)
Pt's wife stated pt takes Tikosyn at home every day twice a day at 8am and 8pm. Home meds were entered and Tikosyn was ordered from 10am and 10pm. Pt's wife stated the doctor was adamant about getting this medication everyday and at the same time of day. Pt's wife brought the medication with her and gave the pt his morning dose at 9am this morning from his home meds.

## 2019-04-06 NOTE — Progress Notes (Signed)
  Echocardiogram 2D Echocardiogram has been performed.  [name] 04/06/2019, 8:23 AM

## 2019-04-07 ENCOUNTER — Other Ambulatory Visit: Payer: Self-pay

## 2019-04-07 ENCOUNTER — Inpatient Hospital Stay (HOSPITAL_COMMUNITY): Payer: Medicare Other

## 2019-04-07 DIAGNOSIS — I4819 Other persistent atrial fibrillation: Secondary | ICD-10-CM

## 2019-04-07 LAB — COMPREHENSIVE METABOLIC PANEL
ALT: 19 U/L (ref 0–44)
AST: 34 U/L (ref 15–41)
Albumin: 3.2 g/dL — ABNORMAL LOW (ref 3.5–5.0)
Alkaline Phosphatase: 42 U/L (ref 38–126)
Anion gap: 9 (ref 5–15)
BUN: 19 mg/dL (ref 8–23)
CO2: 21 mmol/L — ABNORMAL LOW (ref 22–32)
Calcium: 8.2 mg/dL — ABNORMAL LOW (ref 8.9–10.3)
Chloride: 108 mmol/L (ref 98–111)
Creatinine, Ser: 0.91 mg/dL (ref 0.61–1.24)
GFR calc Af Amer: >60 mL/min (ref >60)
GFR calc non Af Amer: >60 mL/min (ref >60)
Glucose, Bld: 106 mg/dL — ABNORMAL HIGH (ref 70–99)
Potassium: 4.1 mmol/L (ref 3.5–5.1)
Sodium: 138 mmol/L (ref 135–145)
Total Bilirubin: 2.1 mg/dL — ABNORMAL HIGH (ref 0.3–1.2)
Total Protein: 6.1 g/dL — ABNORMAL LOW (ref 6.5–8.1)

## 2019-04-07 LAB — MAGNESIUM: Magnesium: 2.1 mg/dL (ref 1.7–2.4)

## 2019-04-07 LAB — CBC
HCT: 35.3 % — ABNORMAL LOW (ref 39.0–52.0)
Hemoglobin: 11.8 g/dL — ABNORMAL LOW (ref 13.0–17.0)
MCH: 30.2 pg (ref 26.0–34.0)
MCHC: 33.4 g/dL (ref 30.0–36.0)
MCV: 90.3 fL (ref 80.0–100.0)
Platelets: 126 10*3/uL — ABNORMAL LOW (ref 150–400)
RBC: 3.91 MIL/uL — ABNORMAL LOW (ref 4.22–5.81)
RDW: 13.1 % (ref 11.5–15.5)
WBC: 8.7 10*3/uL (ref 4.0–10.5)
nRBC: 0 % (ref 0.0–0.2)

## 2019-04-07 LAB — MRSA PCR SCREENING: MRSA by PCR: NEGATIVE

## 2019-04-07 MED ORDER — IOHEXOL 300 MG/ML  SOLN
75.0000 mL | Freq: Once | INTRAMUSCULAR | Status: AC | PRN
  Administered 2019-04-07: 75 mL via INTRAVENOUS

## 2019-04-07 NOTE — Progress Notes (Signed)
Patient Demographics:    [name], is a 68 y.o. male, DOB - 1950-07-09, [id number]  Admit date - 04/06/2019   Admitting Physician Aileene Lanum Denton Brick, MD  Outpatient Primary MD for the patient is Glenda Chroman, MD  LOS - 1   Chief Complaint  Patient presents with  . Shortness of Breath        Subjective:    [name] today has no fevers, no emesis,  No further chest pain,  Cough is better, shob is improving - DOE persist , -Voiding well  Assessment  & Plan :    Principal Problem:   Acute respiratory failure with hypoxemia (HCC) Active Problems:   Restless leg   Chronic systolic CHF (congestive heart failure) (HCC)   HTN (hypertension)   Gout   BPH (benign prostatic hyperplasia)   CAD (coronary artery disease)   Atrial fibrillation (HCC)   Status post ablation of atrial fibrillation   CAP (community acquired pneumonia)   Chest pain of uncertain etiology   Acute exacerbation of CHF (congestive heart failure) ([place])  Brief Summary:- 68 y.o. male with a hx of HTN, obesity, gout, BPH and persistent AFib/flutter ,s/p EPS/Ablation 04/04/2019  readmitted 04/06/2019 with acute hypoxic respiratory failure and productive cough  CT Chest IMPRESSION: 1. There is partial collapse of the right lower lobe and right middle lobe. There is no obvious endoluminal filling defect within the right lower lobe bronchus.   A/p 1)Acute Hypoxic Respiratory Failure--- appears to be resolving--- multifactorial in the setting of CHF and possibly right-sided pneumonia -Treat underlying etiology -Hypoxia is improving, continue as needed supplemental oxygen -CT Chest noted with partial collapse of the right lower lobe and right middle lobe--consider pulmonology consult for bronchoscopy  2) atypical chest pain with elevated troponins in the setting of recent ablation--echo noted as below, per  cardiologist Dr. Kirk Ruths no plans for further ischemia work-up -Troponin peaked at 1220 -Cardiology service believes chest pain is probably due to Pleuropericarditis, patient on colchicine as below  3)HFpEF--- EF 45 to 50% with grade 1 dCHF--cardiology recommends gentle diuresis with Lasix 20 mg IV twice daily, continue avapro,  -Hypoxia and dyspnea improving, -Daily weights, fluid input and output and electrolytes with diuresis  4)Possible Pleuropericarditis--Per cardiologist okay to treat with colchicine 0.6 mg daily  5)Persistent A. fib/flutter -status post prior cardioversion x 2  (11/2017 and 01/14/19)  and subsequent Ablation on 04/04/2019--- concerns about prolonged QT -Per cardiology team okay to continue Tikosyn , Coreg 18.75 mg twice daily and c/n Xarelto  -EP consult from Dr. Curt Bears appreciated, QTC appears more stable at this time,  6)Possible right-sided Pneumonia/HCAP--- continue cefepime for possible HCAP, -Cough is productive, be judicious with bronchodilators to avoid tachycardia -give Mucinex  7)PFO on CT Coronary--- no syncope or CVA  8)HTN--stable, continue Coreg 18.75 mg twice daily, amlodipine 5 mg daily, Avapro 300 mg daily,   Disposition/Need for in-Hospital Stay- patient unable to be discharged at this time due to -hypoxia and respiratory problems requiring further investigation IV antibiotics  Code Status : full  Family Communication:    (patient is alert, awake and coherent) Discussed with pt's wife  Disposition Plan  : home  Consults  :  Cardiology, PCCM  DVT Prophylaxis  :  Xarelto - SCDs   Lab Results  Component Value Date   PLT 126 (L) 04/07/2019    Inpatient Medications  Scheduled Meds: . acidophilus  1 capsule Oral Daily  . allopurinol  300 mg Oral Daily  . amLODipine  5 mg Oral Daily  . carvedilol  18.75 mg Oral BID WC  . colchicine  0.6 mg Oral Daily  . dofetilide  250 mcg Oral BID  . furosemide  20 mg Intravenous BID   . guaiFENesin  600 mg Oral BID  . irbesartan  300 mg Oral Daily  . multivitamin with minerals  1 tablet Oral Daily  . omega-3 acid ethyl esters  1 g Oral Daily  . potassium chloride  20 mEq Oral Daily  . rivaroxaban  20 mg Oral Q supper  . sodium chloride flush  3 mL Intravenous Q12H  . tamsulosin  0.4 mg Oral Daily   Continuous Infusions: . sodium chloride    . ceFEPime (MAXIPIME) IV 2 g (04/07/19 0547)   PRN Meds:.sodium chloride, acetaminophen, iohexol, sodium chloride flush    Anti-infectives (From admission, onward)   Start     Dose/Rate Route Frequency Ordered Stop   04/06/19 0715  ceFEPIme (MAXIPIME) 2 g in sodium chloride 0.9 % 100 mL IVPB     2 g 200 mL/hr over 30 Minutes Intravenous Every 8 hours 04/06/19 0705          Objective:   Vitals:   04/06/19 1900 04/06/19 1950 04/07/19 0417 04/07/19 0840  BP: 108/62 (!) 123/55 (!) 111/56 122/66  Pulse: 68  66 72  Resp: 20 18 20 16   Temp:  98.2 F (36.8 C) 98.1 F (36.7 C)   TempSrc:  Oral Oral   SpO2: 95% 96% 94% 97%  Weight:  126.4 kg    Height:  6\' 2"  (1.88 m)      Wt Readings from Last 3 Encounters:  04/06/19 126.4 kg  04/04/19 125.2 kg  02/27/19 125.1 kg     Intake/Output Summary (Last 24 hours) at 04/07/2019 1435 Last data filed at 04/07/2019 1432 Gross per 24 hour  Intake 53 ml  Output 1550 ml  Net -1497 ml     Physical Exam  Gen:- Awake Alert,  In no apparent distress  HEENT:- [place].AT, No sclera icterus Neck-Supple Neck,No JVD,.  Lungs-diminished in bases, scattered rhonchi CV- S1, S2 normal, regular  Abd-  +ve B.Sounds, Abd Soft, No tenderness,    Extremity/Skin:- +ve  edema, pedal pulses present  Psych-affect is appropriate, oriented x3 Neuro-no new focal deficits, no tremors   Data Review:   Micro Results Recent Results (from the past 240 hour(s))  SARS CORONAVIRUS 2 (TAT 6-24 HRS) Nasopharyngeal Nasopharyngeal Swab     Status: None   Collection Time: 04/01/19  4:15 PM   Specimen:  Nasopharyngeal Swab  Result Value Ref Range Status   SARS Coronavirus 2 NEGATIVE NEGATIVE Final    Comment: (NOTE) SARS-CoV-2 target nucleic acids are NOT DETECTED. The SARS-CoV-2 RNA is generally detectable in upper and lower respiratory specimens during the acute phase of infection. Negative results do not preclude SARS-CoV-2 infection, do not rule out co-infections with other pathogens, and should not be used as the sole basis for treatment or other patient management decisions. Negative results must be combined with clinical observations, patient history, and epidemiological information. The expected result is Negative. Fact Sheet for Patients: SugarRoll.be Fact Sheet for Healthcare Providers: https://www.woods-mathews.com/ This test is not yet approved or cleared by the [place]  States FDA and  has been authorized for detection and/or diagnosis of SARS-CoV-2 by FDA under an Emergency Use Authorization (EUA). This EUA will remain  in effect (meaning this test can be used) for the duration of the COVID-19 declaration under Section 56 4(b)(1) of the Act, 21 U.S.C. section 360bbb-3(b)(1), unless the authorization is terminated or revoked sooner. Performed at [facility], [place] [address], [place], [place] [address]   SARS CORONAVIRUS 2 (TAT 6-24 HRS) Nasopharyngeal Nasopharyngeal Swab     Status: None   Collection Time: 04/06/19  6:46 AM   Specimen: Nasopharyngeal Swab  Result Value Ref Range Status   SARS Coronavirus 2 NEGATIVE NEGATIVE Final    Comment: (NOTE) SARS-CoV-2 target nucleic acids are NOT DETECTED. The SARS-CoV-2 RNA is generally detectable in upper and lower respiratory specimens during the acute phase of infection. Negative results do not preclude SARS-CoV-2 infection, do not rule out co-infections with other pathogens, and should not be used as the sole basis for treatment or other patient management decisions.  Negative results must be combined with clinical observations, patient history, and epidemiological information. The expected result is Negative. Fact Sheet for Patients: SugarRoll.be Fact Sheet for Healthcare Providers: https://www.woods-mathews.com/ This test is not yet approved or cleared by the [place] FDA and  has been authorized for detection and/or diagnosis of SARS-CoV-2 by FDA under an Emergency Use Authorization (EUA). This EUA will remain  in effect (meaning this test can be used) for the duration of the COVID-19 declaration under Section 56 4(b)(1) of the Act, 21 U.S.C. section 360bbb-3(b)(1), unless the authorization is terminated or revoked sooner. Performed at [facility], [place] [address], [place], [place] [address]   Culture, blood (routine x 2)     Status: None (Preliminary result)   Collection Time: 04/06/19  7:30 AM   Specimen: BLOOD  Result Value Ref Range Status   Specimen Description BLOOD RIGHT ANTECUBITAL  Final   Special Requests   Final    BOTTLES DRAWN AEROBIC AND ANAEROBIC Blood Culture adequate volume   Culture   Final    NO GROWTH 1 DAY Performed at [facility], [place] [address] [place], [place] [address]    Report Status PENDING  Incomplete  Culture, blood (routine x 2)     Status: None (Preliminary result)   Collection Time: 04/06/19  7:45 AM   Specimen: Site Not Specified; Blood  Result Value Ref Range Status   Specimen Description SITE NOT SPECIFIED  Final   Special Requests   Final    BOTTLES DRAWN AEROBIC ONLY Blood Culture results may not be optimal due to an inadequate volume of blood received in culture bottles   Culture   Final    NO GROWTH 1 DAY Performed at [facility], [place] [address], [place], [place] [address]    Report Status PENDING  Incomplete    Radiology Reports Dg Chest 2 View  Result Date: 04/06/2019 CLINICAL DATA:  Shortness of breath, history of recent  cardiac ablation EXAM: CHEST - 2 VIEW COMPARISON:  01/13/2019 chest x-ray, CT from 03/30/2019 FINDINGS: Cardiac shadow is stable. Right basilar atelectasis and small effusion is noted. This is new from the prior CT examination. Left lung is clear. Mild vascular congestion is noted. No bony abnormality is noted. IMPRESSION: Right basilar atelectasis with small effusion. This is new from the prior CT examination. Electronically Signed   By: Inez Catalina M.D.   On: 04/06/2019 02:39  Ct Cardiac Morph/pulm Vein W/cm&w/o Ca Score  Addendum Date: 03/30/2019   ADDENDUM REPORT: 03/30/2019 20:40 CLINICAL DATA:  Atrial fibrillation scheduled for an ablation. EXAM: Cardiac CT/CTA TECHNIQUE: A 120 kV prospective scan was triggered in the ascending thoracic aorta at 140 HU's. Gantry rotation speed was 250 msecs and collimation was .6 mm. No beta blockade and no NTG was given. The 3D data set was reconstructed for best systolic and diastolic phases along with delayed images of the LAA Images analyzed on a dedicated work station using MPR, MIP and VRT modes. The patient received 80 cc of contrast. FINDINGS: Image Quality: Limited.  Significant noise artifact due to obesity. There is normal pulmonary vein drainage into the left atrium (2 on the right and 2 on the left) with ostial measurements as follows: RUPV: Ostium 25.6 mm x 22.9 mm  area 4.13 cm2 RLPV:  Ostium 24.9 mm x 20.6 mm  area 3.80 cm2 LUPV:  Ostium 20 mm x 10.5 mm area 1.39 cm2 LLPV:  Ostium 18.6 mm x 14.8 mm  area 2.05 cm2 Severely dilated left atrium (AP diameter 57 mm). The IAS bows into the RA consistent with elevated left atrial pressure. There is likely a small PFO present with L to R shunting observed on cine imaging. I suspect this is related to increased left atrial pressure. There is no ASD. The left atrial appendage is large with two lobes and ostial size 25.1 mm x 20.5 mm and length 45 mm. There is no thrombus in the left atrial appendage. The esophagus  runs in the left atrial midline and is close proximity to the LLPV ostium. Aorta: Normal caliber. No dissection or calcifications. Aberrant right subclavian artery. Aortic Valve: Trileaflet.  No calcifications. Coronary Arteries: Normal coronary origin. Right dominance. The study was performed without use of NTG and is insufficient for plaque evaluation. Coronary calcium score of 18. Extra-cardiac findings: See attached radiology report for non-cardiac structures. IMPRESSION: 1. There is normal pulmonary vein drainage into the left atrium with ostial measurements above. 2. There is no thrombus in the left atrial appendage. 3. The esophagus runs in the left atrial midline and is in close proximity to the LLPV ostium. 4. Severely dilated left atrium. 5. IAS bows into the RA consistent with increased LA pressure with likely PFO. 6. Coronary calcium score of 18 which is 55th percentile for age/sex. Eleonore Chiquito, MD Electronically Signed   By: Eleonore Chiquito   On: 03/30/2019 20:40   Result Date: 03/30/2019 EXAM: OVER-READ INTERPRETATION  CT CHEST The following report is an over-read performed by radiologist Dr. Aletta Edouard of [facility], [place] on 03/30/2019. This over-read does not include interpretation of cardiac or coronary anatomy or pathology. The cardiac CT interpretation by the cardiologist is attached. COMPARISON:  None. FINDINGS: Vascular: Incidental retroesophageal artery off of the distal aortic arch likely represents an aberrant right subclavian artery. The aortic arch was not imaged and therefore full evaluation of the great vessels was not performed. The visualized thoracic aorta is not aneurysmal and does not demonstrate significant atherosclerosis. Mediastinum/Nodes: Visualized mediastinum and hilar regions demonstrate no enlarged lymph nodes or abnormal masses. Lungs/Pleura: Visualized lungs show no evidence of pulmonary edema, consolidation, pneumothorax, nodule or pleural fluid. Upper  Abdomen: Probable steatosis of the liver. The liver is incompletely imaged. Musculoskeletal: Mild degenerative disc disease of the thoracic spine. IMPRESSION: 1. 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 2. Probable hepatic steatosis. Electronically Signed:  By: Aletta Edouard M.D. On: 03/30/2019 10:33     CBC Recent Labs  Lab 04/06/19 0223 04/07/19 0249  WBC 11.7* 8.7  HGB 12.3* 11.8*  HCT 37.6* 35.3*  PLT 120* 126*  MCV 92.4 90.3  MCH 30.2 30.2  MCHC 32.7 33.4  RDW 13.1 13.1    Chemistries  Recent Labs  Lab 04/06/19 0223 04/06/19 0716 04/07/19 0249  NA 137  --  138  K 3.7  --  4.1  CL 104  --  108  CO2 24  --  21*  GLUCOSE 145*  --  106*  BUN 22  --  19  CREATININE 1.02  --  0.91  CALCIUM 8.2*  --  8.2*  MG  --  2.0 2.1  AST  --   --  34  ALT  --   --  19  ALKPHOS  --   --  42  BILITOT  --   --  2.1*   ------------------------------------------------------------------------------------------------------------------ No results for input(s): CHOL, HDL, LDLCALC, TRIG, CHOLHDL, LDLDIRECT in the last 72 hours.  No results found for: HGBA1C ------------------------------------------------------------------------------------------------------------------ No results for input(s): TSH, T4TOTAL, T3FREE, THYROIDAB in the last 72 hours.  Invalid input(s): FREET3 ------------------------------------------------------------------------------------------------------------------ No results for input(s): VITAMINB12, FOLATE, FERRITIN, TIBC, IRON, RETICCTPCT in the last 72 hours.  Coagulation profile No results for input(s): INR, PROTIME in the last 168 hours.  No results for input(s): DDIMER in the last 72 hours.  Cardiac Enzymes No results for input(s): CKMB, TROPONINI, MYOGLOBIN in the last 168 hours.  Invalid input(s): CK  ------------------------------------------------------------------------------------------------------------------    Component Value Date/Time   BNP 61.5 04/06/2019 0223   Roxan Hockey M.D on 04/07/2019 at 2:35 PM  Go to [email or web address] - for contact info  [facility] - Office  [phone]

## 2019-04-07 NOTE — Consult Note (Addendum)
Cardiology Consultation:   Patient ID: DRAKE HUTCHINGS MRN: [id number]; DOB: 1951-05-01  Admit date: 04/06/2019 Date of Consult: 04/07/2019  Primary Care Provider: Glenda Chroman, MD Primary Cardiologist: Kate Sable, MD  Primary Electrophysiologist:  Cristopher Peru, MD    Patient Profile:   VARIAN WIND is a 68 y.o. male with a hx of HTN, morbid obesity, gout, and persistent AFib/flutter who is being seen today for the evaluation of CP/SOB s/p EPS/Ablation 04/04/2019 at the request of [place].  History of Present Illness:   Mr. Cosgrove follows with Dr. Lovena Le on dofetilide for hie persistent AFib.  Noting increased AFib burden of late referred to Dr. Curt Bears for consideration of PVI ablation. He underwent EPS/PVI ablation with Dr. Curt Bears 04/04/2019, discharged to home same day.  He did well until POD #2 developed generalized chest pain and SOB, his SOB progressed to significant orthopnea, unable to breathe comfortably laying down and came in for evaluation.  He was ordered for chest CT but unable to lay for it, TTE done noted no significant changes from his prior with only trivial pericardial effusion, LVEF 45-50% (full report below).  CXR noted new R basilar atelectasis /small effusion.  HS Trop elevated 1220 > 1045, this felt 2/2 his ablation, not ACS There was concern by one EKG for QT prolongation though follow up EKG looked OK his home dofetilide continued  He was admitted started on colchicine for suspect pericarditis and IV lasix  He is feeling better this AM, much more comfortable CP nearly resolved, he has not tried laying in bed.  LABS K+ 4.1 BUN/Creat 19/0.91 Mag 2.1 HS Trop 1220 > 1045 BNP 61 WBC 8.7 H/H 11/35 Plts 126   Heart Pathway Score:     Past Medical History:  Diagnosis Date  . Arthritis    "knuckles maybe" (02/28/2018)  . Atrial fib/flutter, transient    a. s/p TEE-guided DCCV on 12/14/2017 with return to [facility].   [name] BPH (benign  prostatic hyperplasia) 12/11/2017  . Essential hypertension   . Gout    "I take RX qd" (02/28/2018)  . Neuropathy   . Pneumonia 11/2017  . Thyroid nodule    Benign    Past Surgical History:  Procedure Laterality Date  . ATRIAL FIBRILLATION ABLATION N/A 04/04/2019   Procedure: ATRIAL FIBRILLATION ABLATION;  Surgeon: Constance Haw, MD;  Location: [facility];  Service: Cardiovascular;  Laterality: N/A;  . BACK SURGERY    . BIOPSY THYROID    . CARDIOVERSION N/A 12/14/2017   Procedure: CARDIOVERSION;  Surgeon: Satira Sark, MD;  Location: [facility];  Service: Cardiovascular;  Laterality: N/A;  . CARDIOVERSION N/A 01/14/2019   Procedure: CARDIOVERSION;  Surgeon: Constance Haw, MD;  Location: [facility];  Service: Cardiovascular;  Laterality: N/A;  . CATARACT EXTRACTION W/ INTRAOCULAR LENS  IMPLANT, BILATERAL Bilateral 2006-2010   "right-left"  . COLONOSCOPY  2012  . IRRIGATION AND DEBRIDEMENT SEBACEOUS CYST    . LUMBAR LAMINECTOMY N/A 01/20/2013   Procedure: MICRODISCECTOMY LUMBAR LAMINECTOMY;  Surgeon: Marybelle Killings, MD;  Location: [facility];  Service: Orthopedics;  Laterality: N/A;  L4-5 Decompression  . PILONIDAL CYST DRAINAGE    . TEE WITHOUT CARDIOVERSION N/A 12/14/2017   Procedure: TRANSESOPHAGEAL ECHOCARDIOGRAM (TEE) WITH PROPOFOL;  Surgeon: Satira Sark, MD;  Location: [facility];  Service: Cardiovascular;  Laterality: N/A;     Home Medications:  Prior to Admission medications   Medication Sig Start Date End Date Taking? Authorizing Provider  acetaminophen (TYLENOL)  500 MG tablet Take 1,000 mg by mouth every 6 (six) hours as needed (for pain.).    Yes [provider]  allopurinol (ZYLOPRIM) 300 MG tablet Take 300 mg by mouth daily.    Yes [provider]  amLODipine (NORVASC) 5 MG tablet TAKE ONE TABLET BY MOUTH DAILY. (LUNCH TIME) Patient taking differently: Take 5 mg by mouth daily.  02/03/19  Yes Herminio Commons, MD  Calcium  Carb-Cholecalciferol (CALCIUM + D3 PO) Take 1 tablet by mouth daily at 3 pm.    Yes [provider]  carvedilol (COREG) 12.5 MG tablet Take 18.75 mg by mouth 2 (two) times daily with a meal.   Yes [provider]  cetirizine (ZYRTEC) 10 MG tablet Take 10 mg by mouth daily.    Yes [provider]  CINNAMON PO Take 350 mg by mouth daily at 3 pm.    Yes [provider]  Coenzyme Q10 200 MG capsule Take 400 mg by mouth daily in the afternoon.    Yes [provider]  dofetilide (TIKOSYN) 250 MCG capsule TAKE ONE CAPSULE BY MOUTH TWICE DAILY. Patient taking differently: Take 250 mcg by mouth 2 (two) times daily.  03/27/19  Yes Emerald Shor Hassell Done, MD  Ferrous Gluconate-C-Folic Acid (IRON-C PO) Take 1 tablet by mouth daily.   Yes [provider]  Glucosamine-Chondroitin (GLUCOSAMINE CHONDR COMPLEX PO) Take 1 tablet by mouth daily.    Yes [provider]  LUTEIN-ZEAXANTHIN PO Take 2 tablets by mouth daily in the afternoon.    Yes [provider]  Multiple Vitamins-Minerals (MULTIVITAMIN PO) Take 1 tablet by mouth daily at 3 pm.    Yes [provider]  olmesartan (BENICAR) 40 MG tablet TAKE ONE TABLET BY MOUTH DAILY Patient taking differently: Take 40 mg by mouth daily.  12/27/18  Yes Herminio Commons, MD  Omega-3 Fatty Acids (OMEGA 3 PO) Take 1,350 mg by mouth daily.   Yes [provider]  PHOSPHATIDYLCHOLINE PO Take 2 tablets by mouth daily in the afternoon.    Yes [provider]  potassium chloride (KLOR-CON) 10 MEQ tablet TAKE ONE TABLET BY MOUTH TWICE DAILY. Patient taking differently: Take 20 mEq by mouth daily.  03/27/19  Yes Alyssah Algeo, Ocie Doyne, MD  Probiotic Product (PROBIOTIC DAILY PO) Take 2 tablets by mouth daily in the afternoon.    Yes [provider]  RESVERATROL 100 MG CAPS Take 100 mg by mouth daily at 3 pm.    Yes [provider]  tamsulosin (FLOMAX) 0.4 MG CAPS capsule  Take 0.4 mg by mouth daily.  08/06/15  Yes [provider]  XARELTO 20 MG TABS tablet TAKE ONE TABLET BY MOUTH DAILY WITH LUNCH Patient taking differently: Take 20 mg by mouth daily with supper.  12/27/18  Yes Herminio Commons, MD    Inpatient Medications: Scheduled Meds: . acidophilus  1 capsule Oral Daily  . allopurinol  300 mg Oral Daily  . amLODipine  5 mg Oral Daily  . carvedilol  18.75 mg Oral BID WC  . colchicine  0.6 mg Oral Daily  . dofetilide  250 mcg Oral BID  . furosemide  20 mg Intravenous BID  . guaiFENesin  600 mg Oral BID  . irbesartan  300 mg Oral Daily  . multivitamin with minerals  1 tablet Oral Daily  . omega-3 acid ethyl esters  1 g Oral Daily  . potassium chloride  20 mEq Oral Daily  . rivaroxaban  20  mg Oral Q supper  . sodium chloride flush  3 mL Intravenous Q12H  . tamsulosin  0.4 mg Oral Daily   Continuous Infusions: . sodium chloride    . ceFEPime (MAXIPIME) IV 2 g (04/07/19 0547)   PRN Meds: sodium chloride, acetaminophen, iohexol, sodium chloride flush  Allergies:    Allergies  Allergen Reactions  . Azithromycin     QT WAVE    Social History:   Social History   Socioeconomic History  . Marital status: Married    Spouse name: Not on file  . Number of children: 3  . Years of education: Not on file  . Highest education level: Not on file  Occupational History  . Not on file  Social Needs  . Financial resource strain: Not on file  . Food insecurity    Worry: Not on file    Inability: Not on file  . Transportation needs    Medical: Not on file    Non-medical: Not on file  Tobacco Use  . Smoking status: Never Smoker  . Smokeless tobacco: Never Used  Substance and Sexual Activity  . Alcohol use: Never    Alcohol/week: 0.0 standard drinks    Frequency: Never  . Drug use: Never  . Sexual activity: Not Currently  Lifestyle  . Physical activity    Days per week: Not on file    Minutes per session: Not on file  . Stress:  Not on file  Relationships  . Social Herbalist on phone: Not on file    Gets together: Not on file    Attends religious service: Not on file    Active member of club or organization: Not on file    Attends meetings of clubs or organizations: Not on file    Relationship status: Not on file  . Intimate partner violence    Fear of current or ex partner: Not on file    Emotionally abused: Not on file    Physically abused: Not on file    Forced sexual activity: Not on file  Other Topics Concern  . Not on file  Social History Narrative  . Not on file    Family History:   Family History  Problem Relation Age of Onset  . Lung cancer Father   . Cancer - Lung Father   . Atrial fibrillation Mother   . Dementia Mother   . Transient ischemic attack Mother   . Hypertension Other   . Liver cancer Brother      ROS:  Please see the history of present illness.  All other ROS reviewed and negative.     Physical Exam/Data:   Vitals:   04/06/19 1900 04/06/19 1950 04/07/19 0417 04/07/19 0840  BP: 108/62 (!) 123/55 (!) 111/56 122/66  Pulse: 68  66 72  Resp: 20 18 20 16   Temp:  98.2 F (36.8 C) 98.1 F (36.7 C)   TempSrc:  Oral Oral   SpO2: 95% 96% 94% 97%  Weight:  126.4 kg    Height:  6\' 2"  (1.88 m)      Intake/Output Summary (Last 24 hours) at 04/07/2019 0932 Last data filed at 04/07/2019 0842 Gross per 24 hour  Intake 53 ml  Output -  Net 53 ml   Last 3 Weights 04/06/2019 04/06/2019 04/04/2019  Weight (lbs) 278 lb 10.6 oz 276 lb 276 lb  Weight (kg) 126.4 kg 125.193 kg 125.193 kg     Body mass index is 35.78  kg/m.  General:  Well nourished, well developed, in no acute distress HEENT: normal Lymph: no adenopathy Neck: no JVD Endocrine:  No thryomegaly Vascular: No carotid bruits  Cardiac:  RRR; no rubs, murmurs, gallops Lungs:  CTA b/l, no wheezing, rhonchi or rales  Abd: soft, nontender, obese Ext: trace edema Musculoskeletal:  No deformities Skin:  warm and dry  Neuro:   no focal abnormalities noted Psych:  Normal affect   EKG:  The EKG was personally reviewed and demonstrates:    SR, 74, baseline movement though some suggestion of ST changes lat leads SR, has some ST elevations lat leads, QTc 41ms   Telemetry:  Telemetry was personally reviewed and demonstrates:    SR, no arrhythmias      Relevant CV Studies:  04/06/2019 TTE 1. Left ventricular ejection fraction, by visual estimation, is 45 to 50%. The left ventricle has mildly decreased function. There is mildly increased left ventricular hypertrophy. 2. The left ventricle demonstrates global hypokinesis. 3. Left ventricular diastolic parameters are consistent with Grade I diastolic dysfunction (impaired relaxation). 4. Global right ventricle has normal systolic function.The right ventricular size is normal. No increase in right ventricular wall thickness. 5. Left atrial size was mildly dilated. 6. Right atrial size was normal. 7. Presence of pericardial fat pad. 8. The pericardial effusion is circumferential. 9. Trivial pericardial effusion is present. 10. The mitral valve is grossly normal. Mild mitral valve regurgitation. No evidence of mitral stenosis. 11. The tricuspid valve is grossly normal. Tricuspid valve regurgitation is trivial. 12. The aortic valve is tricuspid. Aortic valve regurgitation is not visualized. No evidence of aortic valve sclerosis or stenosis. 13. The pulmonic valve was grossly normal. Pulmonic valve regurgitation is trivial. 14. TR signal is inadequate for assessing pulmonary artery systolic pressure.  In comparison to the previous echocardiogram(s): Prior examinations were reviewed in a side by side comparison of images. Normal sinus rhythm is now present. No significant change from 12/12/2017.  04/04/2019: EPS/Ablation CONCLUSIONS: 1. Atrial fibrillation upon presentation.   2. Successful electrical isolation and anatomical  encircling of all four pulmonary veins with radiofrequency current.  A WACA approach was used 3. Additional left atrial ablation was performed along the roof of the left atrium 4. Atrial fibrillation successfully cardioverted to sinus rhythm. 5. No early apparent complications.  Laboratory Data:  High Sensitivity Troponin:   Recent Labs  Lab 04/06/19 0223 04/06/19 0442  TROPONINIHS 1,220* 1,045*     Chemistry Recent Labs  Lab 04/06/19 0223 04/07/19 0249  NA 137 138  K 3.7 4.1  CL 104 108  CO2 24 21*  GLUCOSE 145* 106*  BUN 22 19  CREATININE 1.02 0.91  CALCIUM 8.2* 8.2*  GFRNONAA >60 >60  GFRAA >60 >60  ANIONGAP 9 9    Recent Labs  Lab 04/07/19 0249  PROT 6.1*  ALBUMIN 3.2*  AST 34  ALT 19  ALKPHOS 42  BILITOT 2.1*   Hematology Recent Labs  Lab 04/06/19 0223 04/07/19 0249  WBC 11.7* 8.7  RBC 4.07* 3.91*  HGB 12.3* 11.8*  HCT 37.6* 35.3*  MCV 92.4 90.3  MCH 30.2 30.2  MCHC 32.7 33.4  RDW 13.1 13.1  PLT 120* 126*   BNP Recent Labs  Lab 04/06/19 0223  BNP 61.5    DDimer No results for input(s): DDIMER in the last 168 hours.   Radiology/Studies:   Dg Chest 2 View Result Date: 04/06/2019 CLINICAL DATA:  Shortness of breath, history of recent cardiac ablation EXAM: CHEST - 2  VIEW COMPARISON:  01/13/2019 chest x-ray, CT from 03/30/2019 FINDINGS: Cardiac shadow is stable. Right basilar atelectasis and small effusion is noted. This is new from the prior CT examination. Left lung is clear. Mild vascular congestion is noted. No bony abnormality is noted. IMPRESSION: Right basilar atelectasis with small effusion. This is new from the prior CT examination. Electronically Signed   By: Inez Catalina M.D.   On: 04/06/2019 02:39    Assessment and Plan:   1. CP     Abn EKG, HS trop     Felt 2/2 pericarditis, ablation     Getting relief w/colchicine  2. SOB     Component of above, some mild atelectasis, small effusion     Also improving     Continue lasix,  ordered incentive spirometer  3. Persistent AFib     CHA2DS2Vasc is 2, on Xarelto     S/p ablation > DCCV to SR 04/04/2019     Do not interrupt anticoagulation     QTc stable, continue dofetilide   Dr. Curt Bears has seen and examined the patient Hopefully can discharge later today if continues to feel better after AM medicines       For questions or updates, please contact [facility] Please consult [email or web address] for contact info under     Signed, Baldwin Jamaica, PA-C  04/07/2019 9:32 AM  I have seen and examined this patient with Tommye Standard.  Agree with above, note added to reflect my findings.  On exam, RRR, no murmurs, lungs clear. S/p ablation for AF. Presented with SOB and orthopnea. No obvious volume overload but does have a small plural effusion. Plan for CT yesterday but could not lay flat. Agree with CT today to further eval effusion. In the interim, Calan Doren continue colchicine and diuresis with lasix. Should he feel better by this afternoon would be OK for discharge. Has follow up arranged in EP clinic.    Haidy Kackley M. Seri Kimmer MD 04/07/2019 10:02 AM

## 2019-04-07 NOTE — Plan of Care (Signed)
  Problem: Health Behavior/Discharge Planning: Goal: Ability to manage health-related needs will improve Outcome: Progressing   Problem: Clinical Measurements: Goal: Ability to maintain clinical measurements within normal limits will improve Outcome: Progressing   

## 2019-04-07 NOTE — Progress Notes (Signed)
QTc is .443 Tikosyn given

## 2019-04-07 NOTE — Progress Notes (Signed)
Revisited with pt Telemetry maintaining SR VSS, sats 95-97% on RA Pt reports CP nearly resolved tough feels tired and still feels usually winded.   Lungs: slightly diminished R base, otherwise clear D/w RN, reports only a couple cups of water in, output nearly 2L (neither charted completely yet) EKG looks better No WMA on his echo  D/w Dr. Curt Bears, plan CT chest w/contrast Continue IV lasix No discharge today  Tommye Standard, PAc

## 2019-04-08 DIAGNOSIS — G588 Other specified mononeuropathies: Secondary | ICD-10-CM | POA: Diagnosis present

## 2019-04-08 DIAGNOSIS — G5680 Other specified mononeuropathies of unspecified upper limb: Secondary | ICD-10-CM | POA: Diagnosis present

## 2019-04-08 DIAGNOSIS — J9601 Acute respiratory failure with hypoxia: Secondary | ICD-10-CM

## 2019-04-08 LAB — COMPREHENSIVE METABOLIC PANEL
ALT: 23 U/L (ref 0–44)
AST: 22 U/L (ref 15–41)
Albumin: 3.1 g/dL — ABNORMAL LOW (ref 3.5–5.0)
Alkaline Phosphatase: 45 U/L (ref 38–126)
Anion gap: 10 (ref 5–15)
BUN: 29 mg/dL — ABNORMAL HIGH (ref 8–23)
CO2: 23 mmol/L (ref 22–32)
Calcium: 8.3 mg/dL — ABNORMAL LOW (ref 8.9–10.3)
Chloride: 106 mmol/L (ref 98–111)
Creatinine, Ser: 0.94 mg/dL (ref 0.61–1.24)
GFR calc Af Amer: >60 mL/min (ref >60)
GFR calc non Af Amer: >60 mL/min (ref >60)
Glucose, Bld: 131 mg/dL — ABNORMAL HIGH (ref 70–99)
Potassium: 3.9 mmol/L (ref 3.5–5.1)
Sodium: 139 mmol/L (ref 135–145)
Total Bilirubin: 1.3 mg/dL — ABNORMAL HIGH (ref 0.3–1.2)
Total Protein: 6 g/dL — ABNORMAL LOW (ref 6.5–8.1)

## 2019-04-08 LAB — CBC
HCT: 36.8 % — ABNORMAL LOW (ref 39.0–52.0)
Hemoglobin: 11.9 g/dL — ABNORMAL LOW (ref 13.0–17.0)
MCH: 29.6 pg (ref 26.0–34.0)
MCHC: 32.3 g/dL (ref 30.0–36.0)
MCV: 91.5 fL (ref 80.0–100.0)
Platelets: 122 10*3/uL — ABNORMAL LOW (ref 150–400)
RBC: 4.02 MIL/uL — ABNORMAL LOW (ref 4.22–5.81)
RDW: 13 % (ref 11.5–15.5)
WBC: 7 10*3/uL (ref 4.0–10.5)
nRBC: 0 % (ref 0.0–0.2)

## 2019-04-08 MED ORDER — GUAIFENESIN ER 600 MG PO TB12: 1200.0000 mg | ORAL_TABLET | Freq: Two times a day (BID) | ORAL | Status: DC

## 2019-04-08 MED ORDER — POTASSIUM CHLORIDE 20 MEQ PO PACK
20.0000 meq | PACK | Freq: Once | ORAL | Status: AC
  Administered 2019-04-08: 20 meq via ORAL
  Filled 2019-04-08: qty 1

## 2019-04-08 MED ORDER — GUAIFENESIN ER 600 MG PO TB12: 1200.0000 mg | ORAL_TABLET | Freq: Two times a day (BID) | ORAL | 0 refills | Status: DC

## 2019-04-08 MED ORDER — POTASSIUM CHLORIDE CRYS ER 10 MEQ PO TBCR: 10.0000 meq | EXTENDED_RELEASE_TABLET | ORAL | 3 refills | Status: DC

## 2019-04-08 MED ORDER — COLCHICINE 0.6 MG PO TABS: 0.6000 mg | ORAL_TABLET | Freq: Every day | ORAL | 1 refills | Status: DC

## 2019-04-08 MED ORDER — IPRATROPIUM BROMIDE 0.02 % IN SOLN: 0.5000 mg | Freq: Four times a day (QID) | RESPIRATORY_TRACT | Status: DC

## 2019-04-08 MED ORDER — AMOXICILLIN-POT CLAVULANATE 875-125 MG PO TABS
1.0000 | ORAL_TABLET | Freq: Two times a day (BID) | ORAL | Status: DC
  Administered 2019-04-08: 1 via ORAL
  Filled 2019-04-08: qty 1

## 2019-04-08 MED ORDER — FUROSEMIDE 20 MG PO TABS: 20.0000 mg | ORAL_TABLET | Freq: Two times a day (BID) | ORAL | 2 refills | Status: DC

## 2019-04-08 MED ORDER — AMOXICILLIN-POT CLAVULANATE 875-125 MG PO TABS: 1.0000 | ORAL_TABLET | Freq: Two times a day (BID) | ORAL | 0 refills | Status: AC

## 2019-04-08 MED ORDER — POTASSIUM CHLORIDE 20 MEQ PO PACK: 40.0000 meq | PACK | Freq: Every day | ORAL | Status: DC

## 2019-04-08 MED ORDER — SPIRIVA HANDIHALER 18 MCG IN CAPS: 18.0000 ug | ORAL_CAPSULE | Freq: Every day | RESPIRATORY_TRACT | 2 refills | Status: DC

## 2019-04-08 MED ORDER — UMECLIDINIUM BROMIDE 62.5 MCG/INH IN AEPB: 1.0000 | INHALATION_SPRAY | Freq: Every day | RESPIRATORY_TRACT | 1 refills | Status: DC

## 2019-04-08 MED ORDER — IPRATROPIUM BROMIDE 0.02 % IN SOLN
0.5000 mg | Freq: Four times a day (QID) | RESPIRATORY_TRACT | Status: DC
  Administered 2019-04-08: 0.5 mg via RESPIRATORY_TRACT
  Filled 2019-04-08: qty 2.5

## 2019-04-08 MED ORDER — AMLODIPINE BESYLATE 5 MG PO TABS: 2.5000 mg | ORAL_TABLET | Freq: Every day | ORAL | 1 refills | Status: DC

## 2019-04-08 NOTE — Procedures (Signed)
[place] Chest No effusions bilaterally Good diaphragm movement on L Little/no movement of diaphragm on R, R diaphragm markedly elevated

## 2019-04-08 NOTE — Discharge Summary (Addendum)
[name], is a 68 y.o. male  DOB Mar 16, 1951  MRN [id number].  Admission date:  04/06/2019  Admitting Physician  Roxan Hockey, MD  Discharge Date:  04/08/2019   Primary MD  Glenda Chroman, MD  Recommendations for primary care physician for things to follow:   1)You are taking Xarelto which is which is a blood  thinner so Avoid ibuprofen/Advil/Aleve/Motrin/Goody Powders/Naproxen/BC powders/Meloxicam/Diclofenac/Indomethacin and other Nonsteroidal anti-inflammatory medications as these will make you more likely to bleed and can cause stomach ulcers, can also cause Kidney problems.   2) follow-up with pulmonologist Dr. Ina Homes as scheduled  3) follow-up with cardiologist Dr. Curt Bears as scheduled  4) use incentive spirometry and flutter valve as instructed to help your lung expansion  Admission Diagnosis  Dyspnea, unspecified type [R06.00] Acute exacerbation of CHF (congestive heart failure) ([place]) [I50.9]   Discharge Diagnosis  Dyspnea, unspecified type [R06.00] Acute exacerbation of CHF (congestive heart failure) ([place]) [I50.9]    Principal Problem:   Acute respiratory failure with hypoxemia ([place]) Active Problems:   Restless leg   Chronic systolic CHF (congestive heart failure) ([facility])   HTN (hypertension)   Gout   BPH (benign prostatic hyperplasia)   CAD (coronary artery disease)   Atrial fibrillation ([place])   Status post ablation of atrial fibrillation   CAP (community acquired pneumonia)   Chest pain of uncertain etiology   Acute exacerbation of CHF (congestive heart failure) ([place])   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 s/p TEE-guided DCCV on 12/14/2017 with return to NS[facility].    BPH (benign prostatic hyperplasia) 12/11/2017   Essential  hypertension    Gout    "I take RX qd" (02/28/2018)   Neuropathy    Pneumonia 11/2017   Thyroid nodule    Benign    Past Surgical History:  Procedure Laterality Date   ATRIAL FIBRILLATION ABLATION N/A 04/04/2019   Procedure: ATRIAL FIBRILLATION ABLATION;  Surgeon: Constance Haw, MD;  Location: [facility];  Service: Cardiovascular;  Laterality: N/A;   BACK SURGERY     BIOPSY THYROID     CARDIOVERSION N/A 12/14/2017   Procedure: CARDIOVERSION;  Surgeon: Satira Sark, MD;  Location: [facility];  Service: Cardiovascular;  Laterality: N/A;   CARDIOVERSION N/A 01/14/2019   Procedure: CARDIOVERSION;  Surgeon: Constance Haw, MD;  [facility]: [facility];  Service: Cardiovascular;  Laterality: N/A;   CATARACT EXTRACTION W/ INTRAOCULAR LENS  IMPLANT, BILATERAL Bilateral 2006-2010   "right-left"   COLONOSCOPY  2012   IRRIGATION AND DEBRIDEMENT SEBACEOUS CYST     LUMBAR LAMINECTOMY N/A 01/20/2013   Procedure: MICRODISCECTOMY LUMBAR LAMINECTOMY;  Surgeon: Marybelle Killings, MD;  Location: [facility];  Service: Orthopedics;  Laterality: N/A;  L4-5 Decompression   PILONIDAL CYST DRAINAGE     TEE WITHOUT CARDIOVERSION N/A 12/14/2017   Procedure: TRANSESOPHAGEAL ECHOCARDIOGRAM (  TEE) WITH PROPOFOL;  Surgeon: Satira Sark, MD;  Location: [facility];  Service: Cardiovascular;  Laterality: N/A;      HPI  from the history and physical done on the day of admission:    -  Chief Complaint: Shortness of breath, orthopnea cannot lie down flat, atypical chest pain, sore chest  HPI: [name] is a 68 y.o. male with medical history significant for essential hypertension, gout, BPH, thyroid nodule, chronic persistent atrial fibrillation, had an ablation for A. fib Tuesday.  He was okay till Wednesday afternoon when he developed shortness of breath and some productive cough.  Brown in color per patient.   ED Course:  Patient with severe shortness of breath Emergency  room physician tried to do CT scan to rule out PE not able to do it White count 11,000 respiratory rate 28 Chest x-ray right lower lobe infiltrate with small pleural effusion Lactic acid procalcitonin, pending Stat echocardiogram pending   Hospital Course:     Brief Summary:- 68 y.o.malewith a hx of HTN, obesity, gout, BPH and persistent AFib/flutter,s/p EPS/Ablation 04/04/2019 readmitted 04/06/2019 with acute hypoxic respiratory failure and productive cough  CT Chest IMPRESSION: 1. There is partial collapse of the right lower lobe and right middle lobe. There is no obvious endoluminal filling defect within the right lower lobe bronchus.  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 Tamala Julian appreciated-recommends incentive spirometry, flutter valve, patient is sleeping reclined position, -Plan is for outpatient sniff to document degree of dysfunction in PACS  -Patient may need plication if symptoms last beyond 6 months and failed to resolve spontaneously -Treat with Incruse Ellipta and treat empirically with Augmentin for 5 days for possible concomitant pneumonia  2)AcuteHypoxicRespiratoryFailure------ on admission patient had hypoxia, multifactorial in the setting of CHF and possibly right-sided pneumonia in the setting of right phrenic nerve palsy -Hypoxia appears to have resolved O2 sats post ambulation with O2 sats in the mid to high 90s on room air -CT Chest noted with partial collapse of the right lower lobe and right middle lobe--most likely due to right-sided phrenic nerve palsy with persistent elevated right hemi-diaphragm -Pulmonary consult from Dr. Ina Homes appreciated  3)Atypical chest pain with elevated troponins in the setting of recent ablation--echo noted as below, per cardiologist Dr. Kirk Ruths no plans for  further ischemia work-up -Troponin peaked at 1220 -Cardiology service believes chest pain is probably due to Pleuropericarditis, patient on colchicine as below  3)HFpEF---EF 45 to 50% with grade 1 dCHF--cardiology consult appreciated  -Hypoxia and dyspnea improved significantly with IV Lasix  -Okay to discharge on Lasix 20 mg p.o. 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 fib/flutter-status post prior cardioversionx 2 (11/2017 and 01/14/19) and subsequent Ablationon 04/04/2019---concerns about prolonged QT -Per cardiology team okay to continue Tikosyn,Coreg 18.75 mg twice daily and c/n Xarelto  -EP consult from Dr. Curt Bears appreciated, QTC appears more stable at this time,  6)PFO on CT Coronary---no syncope or CVA  7)HTN--stable, continue Coreg 18.75 mg twice daily, amlodipine 2.5 mg daily,Avapro 300 mgdaily,  Discharge Condition: Stable  Follow UP  Follow-up Information    Candee Furbish, MD. Schedule an appointment as soon as possible for a visit.   Specialty: Pulmonary Disease Why: will schedule appt with early December, call if you do not hear from [place] by next week.  Contact information: [address] [place] [address] [phone]            Consults obtained -cardiology and pulmonology  Diet and Activity recommendation:  As advised  Discharge Instructions    Discharge Instructions    ([place]) Call MD:  Anytime you have any of the following symptoms: 1) 3 pound weight gain in 24 hours or 5 pounds in 1 week 2) shortness of breath, with or without a dry hacking cough 3) swelling in the hands, feet or stomach 4) if you have to sleep on extra pillows at night in order to breathe.   Complete by: As directed    Call MD for:  difficulty breathing, headache or visual disturbances   Complete by: As directed    Call MD for:  persistant dizziness or  light-headedness   Complete by: As directed    Call MD for:  persistant nausea and vomiting   Complete by: As directed    Call MD for:  severe uncontrolled pain   Complete by: As directed    Call MD for:  temperature >100.4   Complete by: As directed    Diet - low sodium heart healthy   Complete by: As directed    Discharge instructions   Complete by: As directed    1)You are taking Xarelto which which is a blood  thinner so Avoid ibuprofen/Advil/Aleve/Motrin/Goody Powders/Naproxen/BC powders/Meloxicam/Diclofenac/Indomethacin and other Nonsteroidal anti-inflammatory medications as these will make you more likely to bleed and can cause stomach ulcers, can also cause Kidney problems.   2) follow-up with pulmonologist Dr. Ina Homes as scheduled  3) follow-up with cardiologist Dr. Curt Bears as scheduled  4) use incentive spirometry and flutter valve as instructed to help your lung expansion   Increase activity slowly   Complete by: As directed         Discharge Medications     Allergies as of 04/08/2019      Reactions   Azithromycin    QT WAVE      Medication List    TAKE these medications   acetaminophen 500 MG tablet Commonly known as: TYLENOL Take 1,000 mg by mouth every 6 (six) hours as needed (for pain.).   allopurinol 300 MG tablet Commonly known as: ZYLOPRIM Take 300 mg by mouth daily.   amLODipine 5 MG tablet Commonly known as: NORVASC Take 0.5 tablets (2.5 mg total) by mouth daily. What changed: See the new instructions.   amoxicillin-clavulanate 875-125 MG tablet Commonly known as: AUGMENTIN Take 1 tablet by mouth 2 (two) times daily for 5 days.   CALCIUM + D3 PO Take 1 tablet by mouth daily at 3 pm.   carvedilol 12.5 MG tablet Commonly known as: COREG Take 18.75 mg by mouth 2 (two) times daily with a meal.   cetirizine 10 MG tablet Commonly known as: ZYRTEC Take 10 mg by mouth daily.   CINNAMON PO Take 350 mg by mouth daily at 3 pm.     Coenzyme Q10 200 MG capsule Take 400 mg by mouth daily in the afternoon.   colchicine 0.6 MG tablet Take 1 tablet (0.6 mg total) by mouth daily. Start taking on: April 09, 2019   dofetilide 250 MCG capsule Commonly known as: TIKOSYN TAKE ONE CAPSULE BY MOUTH TWICE DAILY.   furosemide 20 MG tablet Commonly known as: Lasix Take 1 tablet (20 mg total) by mouth 2 (two) times daily.   GLUCOSAMINE CHONDR COMPLEX PO Take 1 tablet by mouth daily.  guaiFENesin 600 MG 12 hr tablet Commonly known as: MUCINEX Take 2 tablets (1,200 mg total) by mouth 2 (two) times daily.   IRON-C PO Take 1 tablet by mouth daily.   LUTEIN-ZEAXANTHIN PO Take 2 tablets by mouth daily in the afternoon.   MULTIVITAMIN PO Take 1 tablet by mouth daily at 3 pm.   olmesartan 40 MG tablet Commonly known as: BENICAR TAKE ONE TABLET BY MOUTH DAILY   OMEGA 3 PO Take 1,350 mg by mouth daily.   PHOSPHATIDYLCHOLINE PO Take 2 tablets by mouth daily in the afternoon.   potassium chloride 10 MEQ tablet Commonly known as: KLOR-CON Take 1 tablet (10 mEq total) by mouth See admin instructions. Take 2 tablets (20 mEq) every morning and 1 tablet  (10 Meq) every evening What changed:   when to take this  additional instructions   PROBIOTIC DAILY PO Take 2 tablets by mouth daily in the afternoon.   Resveratrol 100 MG Caps Take 100 mg by mouth daily at 3 pm.   tamsulosin 0.4 MG Caps capsule Commonly known as: FLOMAX Take 0.4 mg by mouth daily.   umeclidinium bromide 62.5 MCG/INH Aepb Commonly known as: INCRUSE ELLIPTA Inhale 1 puff into the lungs daily.   Xarelto 20 MG Tabs tablet Generic drug: rivaroxaban TAKE ONE TABLET BY MOUTH DAILY WITH LUNCH What changed: See the new instructions.       Major procedures and Radiology Reports - PLEASE review detailed and final reports for all details, in brief -   Dg Chest 2 View  Result Date: 04/06/2019 CLINICAL DATA:  Shortness of breath, history of  recent cardiac ablation EXAM: CHEST - 2 VIEW COMPARISON:  01/13/2019 chest x-ray, CT from 03/30/2019 FINDINGS: Cardiac shadow is stable. Right basilar atelectasis and small effusion is noted. This is new from the prior CT examination. Left lung is clear. Mild vascular congestion is noted. No bony abnormality is noted. IMPRESSION: Right basilar atelectasis with small effusion. This is new from the prior CT examination. Electronically Signed   By: Inez Catalina M.D.   On: 04/06/2019 02:39   Ct Chest W Contrast  Result Date: 04/07/2019 CLINICAL DATA:  Shortness of breath EXAM: CT CHEST WITH CONTRAST TECHNIQUE: Multidetector CT imaging of the chest was performed during intravenous contrast administration. CONTRAST:  80mL OMNIPAQUE IOHEXOL 300 MG/ML  SOLN COMPARISON:  March 30, 2019 FINDINGS: Cardiovascular: The heart size is mildly enlarged. There is significant enlargement of the left atrium. Detection of pulmonary emboli cannot be performed on this exam secondary to contrast timing. There is no evidence for an aortic dissection. There is an aberrant right subclavian artery, a normal variant. There is no significant pericardial effusion. Mediastinum/Nodes: --No mediastinal or hilar lymphadenopathy. --No axillary lymphadenopathy. --No supraclavicular lymphadenopathy. --there is a dominant left-sided thyroid nodule measuring approximately 2.1 cm. This was previously evaluated on multiple prior thyroid ultrasounds. --The esophagus is unremarkable Lungs/Pleura: There is partial collapse of the right lower lobe. There is significant atelectasis in volume loss involving the right middle lobe. There is no obvious endoluminal filling defect within the right lower lobe bronchus. Upper Abdomen: No acute abnormality. Musculoskeletal: No chest wall abnormality. No acute or significant osseous findings. Review of the MIP images confirms the above findings. IMPRESSION: 1. There is partial collapse of the right lower lobe and  right middle lobe. There is no obvious endoluminal filling defect within the right lower lobe bronchus. 2. There is an aberrant right subclavian artery, a normal variant. 3. Mild cardiac enlargement  with significant left atrial enlargement. Electronically Signed   By: Constance Holster M.D.   On: 04/07/2019 17:22   Ct Cardiac Morph/pulm Vein W/cm&w/o Ca Score  Addendum Date: 03/30/2019   ADDENDUM REPORT: 03/30/2019 20:40 CLINICAL DATA:  Atrial fibrillation scheduled for an ablation. EXAM: Cardiac CT/CTA TECHNIQUE: A 120 kV prospective scan was triggered in the ascending thoracic aorta at 140 HU's. Gantry rotation speed was 250 msecs and collimation was .6 mm. No beta blockade and no NTG was given. The 3D data set was reconstructed for best systolic and diastolic phases along with delayed images of the LAA Images analyzed on a dedicated work station using MPR, MIP and VRT modes. The patient received 80 cc of contrast. FINDINGS: Image Quality: Limited.  Significant noise artifact due to obesity. There is normal pulmonary vein drainage into the left atrium (2 on the right and 2 on the left) with ostial measurements as follows: RUPV: Ostium 25.6 mm x 22.9 mm  area 4.13 cm2 RLPV:  Ostium 24.9 mm x 20.6 mm  area 3.80 cm2 LUPV:  Ostium 20 mm x 10.5 mm area 1.39 cm2 LLPV:  Ostium 18.6 mm x 14.8 mm  area 2.05 cm2 Severely dilated left atrium (AP diameter 57 mm). The IAS bows into the RA consistent with elevated left atrial pressure. There is likely a small PFO present with L to R shunting observed on cine imaging. I suspect this is related to increased left atrial pressure. There is no ASD. The left atrial appendage is large with two lobes and ostial size 25.1 mm x 20.5 mm and length 45 mm. There is no thrombus in the left atrial appendage. The esophagus runs in the left atrial midline and is close proximity to the LLPV ostium. Aorta: Normal caliber. No dissection or calcifications. Aberrant right subclavian artery.  Aortic Valve: Trileaflet.  No calcifications. Coronary Arteries: Normal coronary origin. Right dominance. The study was performed without use of NTG and is insufficient for plaque evaluation. Coronary calcium score of 18. Extra-cardiac findings: See attached radiology report for non-cardiac structures. IMPRESSION: 1. There is normal pulmonary vein drainage into the left atrium with ostial measurements above. 2. There is no thrombus in the left atrial appendage. 3. The esophagus runs in the left atrial midline and is in close proximity to the LLPV ostium. 4. Severely dilated left atrium. 5. IAS bows into the RA consistent with increased LA pressure with likely PFO. 6. Coronary calcium score of 18 which is 55th percentile for age/sex. Eleonore Chiquito, MD Electronically Signed   By: Eleonore Chiquito   On: 03/30/2019 20:40   Result Date: 03/30/2019 EXAM: OVER-READ INTERPRETATION  CT CHEST The following report is an over-read performed by radiologist Dr. Aletta Edouard of [facility], [place] on 03/30/2019. This over-read does not include interpretation of cardiac or coronary anatomy or pathology. The cardiac CT interpretation by the cardiologist is attached. COMPARISON:  None. FINDINGS: Vascular: Incidental retroesophageal artery off of the distal aortic arch likely represents an aberrant right subclavian artery. The aortic arch was not imaged and therefore full evaluation of the great vessels was not performed. The visualized thoracic aorta is not aneurysmal and does not demonstrate significant atherosclerosis. Mediastinum/Nodes: Visualized mediastinum and hilar regions demonstrate no enlarged lymph nodes or abnormal masses. Lungs/Pleura: Visualized lungs show no evidence of pulmonary edema, consolidation, pneumothorax, nodule or pleural fluid. Upper Abdomen: Probable steatosis of the liver. The liver is incompletely imaged. Musculoskeletal: Mild degenerative disc disease of the thoracic spine. IMPRESSION: 1.  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 2. Probable hepatic steatosis. Electronically Signed: By: Aletta Edouard M.D. On: 03/30/2019 10:33   Micro Results   Recent Results (from the past 240 hour(s))  SARS CORONAVIRUS 2 (TAT 6-24 HRS) Nasopharyngeal Nasopharyngeal Swab     Status: None   Collection Time: 04/01/19  4:15 PM   Specimen: Nasopharyngeal Swab  Result Value Ref Range Status   SARS Coronavirus 2 NEGATIVE NEGATIVE Final    Comment: (NOTE) SARS-CoV-2 target nucleic acids are NOT DETECTED. The SARS-CoV-2 RNA is generally detectable in upper and lower respiratory specimens during the acute phase of infection. Negative results do not preclude SARS-CoV-2 infection, do not rule out co-infections with other pathogens, and should not be used as the sole basis for treatment or other patient management decisions. Negative results must be combined with clinical observations, patient history, and epidemiological information. The expected result is Negative. Fact Sheet for Patients: SugarRoll.be Fact Sheet for Healthcare Providers: https://www.woods-mathews.com/ This test is not yet approved or cleared by the [place] FDA and  has been authorized for detection and/or diagnosis of SARS-CoV-2 by FDA under an Emergency Use Authorization (EUA). This EUA will remain  in effect (meaning this test can be used) for the duration of the COVID-19 declaration under Section 56 4(b)(1) of the Act, 21 U.S.C. section 360bbb-3(b)(1), unless the authorization is terminated or revoked sooner. Performed at [facility], [place] [address], [place], [place] [address]   SARS CORONAVIRUS 2 (TAT 6-24 HRS) Nasopharyngeal Nasopharyngeal Swab     Status: None   Collection Time: 04/06/19  6:46 AM   Specimen: Nasopharyngeal Swab  Result Value Ref Range Status   SARS Coronavirus 2 NEGATIVE  NEGATIVE Final    Comment: (NOTE) SARS-CoV-2 target nucleic acids are NOT DETECTED. The SARS-CoV-2 RNA is generally detectable in upper and lower respiratory specimens during the acute phase of infection. Negative results do not preclude SARS-CoV-2 infection, do not rule out co-infections with other pathogens, and should not be used as the sole basis for treatment or other patient management decisions. Negative results must be combined with clinical observations, patient history, and epidemiological information. The expected result is Negative. Fact Sheet for Patients: SugarRoll.be Fact Sheet for Healthcare Providers: https://www.woods-mathews.com/ This test is not yet approved or cleared by the [place] FDA and  has been authorized for detection and/or diagnosis of SARS-CoV-2 by FDA under an Emergency Use Authorization (EUA). This EUA will remain  in effect (meaning this test can be used) for the duration of the COVID-19 declaration under Section 56 4(b)(1) of the Act, 21 U.S.C. section 360bbb-3(b)(1), unless the authorization is terminated or revoked sooner. Performed at [facility], [place] [address], [place], [place] [address]   Culture, blood (routine x 2)     Status: None (Preliminary result)   Collection Time: 04/06/19  7:30 AM   Specimen: BLOOD  Result Value Ref Range Status   Specimen Description BLOOD RIGHT ANTECUBITAL  Final   Special Requests   Final    BOTTLES DRAWN AEROBIC AND ANAEROBIC Blood Culture adequate volume   Culture   Final    NO GROWTH 2 DAYS Performed at [facility], [place] [address] [place], [place] [address]    Report Status PENDING  Incomplete  Culture, blood (routine x 2)     Status: None (Preliminary result)   Collection Time: 04/06/19  7:45 AM   Specimen: Site Not  Specified; Blood  Result Value Ref Range Status   Specimen Description SITE NOT SPECIFIED  Final   Special Requests   Final     BOTTLES DRAWN AEROBIC ONLY Blood Culture results may not be optimal due to an inadequate volume of blood received in culture bottles   Culture   Final    NO GROWTH 2 DAYS Performed at [facility], [place] [address], [place], [place] [address]    Report Status PENDING  Incomplete  MRSA PCR Screening     Status: None   Collection Time: 04/07/19  4:11 PM   Specimen: Nasal Mucosa; Nasopharyngeal  Result Value Ref Range Status   MRSA by PCR NEGATIVE NEGATIVE Final    Comment:        The GeneXpert MRSA Assay (FDA approved for NASAL specimens only), is one component of a comprehensive MRSA colonization surveillance program. It is not intended to diagnose MRSA infection nor to guide or monitor treatment for MRSA infections. Performed at [facility], [place] [address], [place], [place] [address]        Today   Subjective    Dishawn Radilla today has no new complaints No fever  Or chills  No Nausea, Vomiting or Diarrhea        Patient has been seen and examined prior to discharge   Objective   Blood pressure 133/64, pulse 67, temperature 97.8 F (36.6 C), temperature source Oral, resp. rate 15, height 6\' 2"  (1.88 m), weight 126.4 kg, SpO2 97 %.   Intake/Output Summary (Last 24 hours) at 04/08/2019 1743 Last data filed at 04/08/2019 1700 Gross per 24 hour  Intake 240 ml  Output 3695 ml  Net -3455 ml    Exam Gen:- Awake Alert, no acute distress  HEENT:- .AT, No sclera icterus Neck-Supple Neck,No JVD,.  Lungs-diminished breath sounds on the right, mostly clear on the left CV- S1, S2 normal, regular Abd-  +ve B.Sounds, Abd Soft, No tenderness,    Extremity/Skin:- trace  edema,   good pulses Psych-affect is appropriate, oriented x3 Neuro-no new focal deficits, no tremors    Data Review   CBC w Diff:  Lab Results  Component Value Date   WBC 7.0 04/08/2019   HGB 11.9 (L) 04/08/2019   HGB 12.6 (L) 03/20/2019   HCT 36.8 (L) 04/08/2019   HCT 37.8  03/20/2019   PLT 122 (L) 04/08/2019   PLT 139 (L) 03/20/2019   LYMPHOPCT 15 01/13/2019   MONOPCT 6 01/13/2019   EOSPCT 1 01/13/2019   BASOPCT 0 01/13/2019    CMP:  Lab Results  Component Value Date   NA 139 04/08/2019   NA 145 (H) 03/20/2019   K 3.9 04/08/2019   CL 106 04/08/2019   CO2 23 04/08/2019   BUN 29 (H) 04/08/2019   BUN 15 03/20/2019   CREATININE 0.94 04/08/2019   PROT 6.0 (L) 04/08/2019   PROT 6.7 10/31/2015   ALBUMIN 3.1 (L) 04/08/2019   BILITOT 1.3 (H) 04/08/2019   ALKPHOS 45 04/08/2019   AST 22 04/08/2019   ALT 23 04/08/2019  .   Total Discharge time is about 33 minutes  Roxan Hockey M.D on 04/08/2019 at 5:43 PM  Go to [email or web address] -  for contact info  [facility] - Office  [phone]

## 2019-04-08 NOTE — Progress Notes (Signed)
Progress Note  Patient Name: [name] Date of Encounter: 04/08/2019  Primary Cardiologist: Kate Sable, MD   Subjective   Remains short of breath when lying flat.  Otherwise he is feeling well.  He is improved since yesterday.  Inpatient Medications    Scheduled Meds:  acidophilus  1 capsule Oral Daily   allopurinol  300 mg Oral Daily   amLODipine  5 mg Oral Daily   carvedilol  18.75 mg Oral BID WC   colchicine  0.6 mg Oral Daily   dofetilide  250 mcg Oral BID   furosemide  20 mg Intravenous BID   guaiFENesin  600 mg Oral BID   irbesartan  300 mg Oral Daily   multivitamin with minerals  1 tablet Oral Daily   omega-3 acid ethyl esters  1 g Oral Daily   potassium chloride  20 mEq Oral Daily   rivaroxaban  20 mg Oral Q supper   sodium chloride flush  3 mL Intravenous Q12H   tamsulosin  0.4 mg Oral Daily   Continuous Infusions:  sodium chloride     ceFEPime (MAXIPIME) IV 2 g (04/08/19 0616)   PRN Meds: sodium chloride, acetaminophen, iohexol, sodium chloride flush   Vital Signs    Vitals:   04/07/19 1436 04/07/19 2002 04/08/19 0524 04/08/19 0800  BP: (!) 120/56 (!) 115/45 106/62 (!) 130/58  Pulse: 67 69  64  Resp: 20 (!) 26 17 15   Temp: 97.8 F (36.6 C) 97.8 F (36.6 C) 97.6 F (36.4 C) 97.6 F (36.4 C)  TempSrc: Oral Oral Oral Oral  SpO2: 97% 96% 96% 97%  Weight:      Height:        Intake/Output Summary (Last 24 hours) at 04/08/2019 0923 Last data filed at 04/08/2019 0526 Gross per 24 hour  Intake --  Output 3625 ml  Net -3625 ml   Last 3 Weights 04/06/2019 04/06/2019 04/04/2019  Weight (lbs) 278 lb 10.6 oz 276 lb 276 lb  Weight (kg) 126.4 kg 125.193 kg 125.193 kg      Telemetry    Sinus rhythm- Personally Reviewed  ECG    None new- Personally Reviewed  Physical Exam   GEN: No acute distress.   Neck: No JVD Cardiac: RRR, no murmurs, rubs, or gallops.  Respiratory: Clear to auscultation bilaterally. GI:  Soft, nontender, non-distended  MS: No edema; No deformity. Neuro:  Nonfocal  Psych: Normal affect   Labs    High Sensitivity Troponin:   Recent Labs  Lab 04/06/19 0223 04/06/19 0442  TROPONINIHS 1,220* 1,045*      Chemistry Recent Labs  Lab 04/06/19 0223 04/07/19 0249 04/08/19 0244  NA 137 138 139  K 3.7 4.1 3.9  CL 104 108 106  CO2 24 21* 23  GLUCOSE 145* 106* 131*  BUN 22 19 29*  CREATININE 1.02 0.91 0.94  CALCIUM 8.2* 8.2* 8.3*  PROT  --  6.1* 6.0*  ALBUMIN  --  3.2* 3.1*  AST  --  34 22  ALT  --  19 23  ALKPHOS  --  42 45  BILITOT  --  2.1* 1.3*  GFRNONAA >60 >60 >60  GFRAA >60 >60 >60  ANIONGAP 9 9 10      Hematology Recent Labs  Lab 04/06/19 0223 04/07/19 0249 04/08/19 0244  WBC 11.7* 8.7 7.0  RBC 4.07* 3.91* 4.02*  HGB 12.3* 11.8* 11.9*  HCT 37.6* 35.3* 36.8*  MCV 92.4 90.3 91.5  MCH 30.2 30.2 29.6  MCHC 32.7 33.4 32.3  RDW 13.1 13.1 13.0  PLT 120* 126* 122*    BNP Recent Labs  Lab 04/06/19 0223  BNP 61.5     DDimer No results for input(s): DDIMER in the last 168 hours.   Radiology    Ct Chest W Contrast  Result Date: 04/07/2019 CLINICAL DATA:  Shortness of breath EXAM: CT CHEST WITH CONTRAST TECHNIQUE: Multidetector CT imaging of the chest was performed during intravenous contrast administration. CONTRAST:  79mL OMNIPAQUE IOHEXOL 300 MG/ML  SOLN COMPARISON:  March 30, 2019 FINDINGS: Cardiovascular: The heart size is mildly enlarged. There is significant enlargement of the left atrium. Detection of pulmonary emboli cannot be performed on this exam secondary to contrast timing. There is no evidence for an aortic dissection. There is an aberrant right subclavian artery, a normal variant. There is no significant pericardial effusion. Mediastinum/Nodes: --No mediastinal or hilar lymphadenopathy. --No axillary lymphadenopathy. --No supraclavicular lymphadenopathy. --there is a dominant left-sided thyroid nodule measuring approximately 2.1 cm.  This was previously evaluated on multiple prior thyroid ultrasounds. --The esophagus is unremarkable Lungs/Pleura: There is partial collapse of the right lower lobe. There is significant atelectasis in volume loss involving the right middle lobe. There is no obvious endoluminal filling defect within the right lower lobe bronchus. Upper Abdomen: No acute abnormality. Musculoskeletal: No chest wall abnormality. No acute or significant osseous findings. Review of the MIP images confirms the above findings. IMPRESSION: 1. There is partial collapse of the right lower lobe and right middle lobe. There is no obvious endoluminal filling defect within the right lower lobe bronchus. 2. There is an aberrant right subclavian artery, a normal variant. 3. Mild cardiac enlargement with significant left atrial enlargement. Electronically Signed   By: Constance Holster M.D.   On: 04/07/2019 17:22    Cardiac Studies   TTE 04/06/19  1. Left ventricular ejection fraction, by visual estimation, is 45 to 50%. The left ventricle has mildly decreased function. There is mildly increased left ventricular hypertrophy.  2. The left ventricle demonstrates global hypokinesis.  3. Left ventricular diastolic parameters are consistent with Grade I diastolic dysfunction (impaired relaxation).  4. Global right ventricle has normal systolic function.The right ventricular size is normal. No increase in right ventricular wall thickness.  5. Left atrial size was mildly dilated.  6. Right atrial size was normal.  7. Presence of pericardial fat pad.  8. The pericardial effusion is circumferential.  9. Trivial pericardial effusion is present. 10. The mitral valve is grossly normal. Mild mitral valve regurgitation. No evidence of mitral stenosis. 11. The tricuspid valve is grossly normal. Tricuspid valve regurgitation is trivial. 12. The aortic valve is tricuspid. Aortic valve regurgitation is not visualized. No evidence of aortic valve  sclerosis or stenosis. 13. The pulmonic valve was grossly normal. Pulmonic valve regurgitation is trivial. 14. TR signal is inadequate for assessing pulmonary artery systolic pressure.  Patient Profile     68 y.o. male presented to the hospital after A. fib ablation with mild chest pain and shortness of breath  Assessment & Plan    1.  Pericarditis: Occurred post ablation.  Currently on colchicine.  Continue current medication.  2.  Shortness of breath: Has some atelectasis and a small pleural effusion.  He has an incentive spirometer which has improved his symptoms.  Also planning for diuresis and is now net out 3.4 L.  At this point, as his breathing is improving, would be okay for discharge home.  Would continue his current home  dose of Lasix.  He does have follow-up in cardiology clinic.  Possible that he had some lung injury or some pulmonary vein spasm after ablation which is causing his shortness of breath.  This should improve with time.  3.  Persistent atrial fibrillation: Currently on dofetilide.  Continue anticoagulation.   [facility] Aizik Reh sign off.   Medication Recommendations: Colchicine 0.6 mg daily for 2 weeks Other recommendations (labs, testing, etc):  none Follow up as an outpatient:  Arranged in EP clinic.  For questions or updates, please contact [place] Please consult [email or web address] for contact info under        Signed, Kue Fox Meredith Leeds, MD  04/08/2019, 9:23 AM

## 2019-04-08 NOTE — Plan of Care (Signed)
  Problem: Health Behavior/Discharge Planning: Goal: Ability to manage health-related needs will improve Outcome: Completed/Met   Problem: Clinical Measurements: Goal: Ability to maintain clinical measurements within normal limits will improve Outcome: Completed/Met Goal: Will remain free from infection Outcome: Completed/Met Goal: Diagnostic test results will improve Outcome: Completed/Met Goal: Respiratory complications will improve Outcome: Completed/Met Goal: Cardiovascular complication will be avoided Outcome: Completed/Met   Problem: Activity: Goal: Risk for activity intolerance will decrease Outcome: Completed/Met   Problem: Nutrition: Goal: Adequate nutrition will be maintained Outcome: Completed/Met   Problem: Coping: Goal: Level of anxiety will decrease Outcome: Completed/Met   Problem: Elimination: Goal: Will not experience complications related to bowel motility Outcome: Completed/Met Goal: Will not experience complications related to urinary retention Outcome: Completed/Met   Problem: Pain Managment: Goal: General experience of comfort will improve Outcome: Completed/Met   Problem: Safety: Goal: Ability to remain free from injury will improve Outcome: Completed/Met   Problem: Safety: Goal: Ability to remain free from injury will improve Outcome: Completed/Met   Problem: Skin Integrity: Goal: Risk for impaired skin integrity will decrease Outcome: Completed/Met

## 2019-04-08 NOTE — Progress Notes (Signed)
Patient in a stable condition discharge education reviewed with patient and her husband at bedside, they verbalized understanding, iv removed,tele dc ccmd notified, patient belongings at bedside, patient ambulated 241ft in the hall on room air and maintained an oxygen saturation of 97% , patient's wife to transport patient home.

## 2019-04-08 NOTE — Discharge Instructions (Signed)
- °  1)You are taking Xarelto which is which is a blood  thinner so Avoid ibuprofen/Advil/Aleve/Motrin/Goody Powders/Naproxen/BC powders/Meloxicam/Diclofenac/Indomethacin and other Nonsteroidal anti-inflammatory medications as these will make you more likely to bleed and can cause stomach ulcers, can also cause Kidney problems.   2) follow-up with pulmonologist Dr. Ina Homes as scheduled  3) follow-up with cardiologist Dr. Curt Bears as scheduled  4) use incentive spirometry and flutter valve as instructed to help your lung expansion       Information on my medicine - XARELTO (Rivaroxaban) Why was Xarelto prescribed for you? Xarelto was prescribed for you to reduce the risk of a blood clot forming that can cause a stroke if you have a medical condition called atrial fibrillation (a type of irregular heartbeat).  What do you need to know about xarelto ? Take your Xarelto ONCE DAILY at the same time every day with your evening meal. If you have difficulty swallowing the tablet whole, you may crush it and mix in applesauce just prior to taking your dose.  Take Xarelto exactly as prescribed by your doctor and DO NOT stop taking Xarelto without talking to the doctor who prescribed the medication.  Stopping without other stroke prevention medication to take the place of Xarelto may increase your risk of developing a clot that causes a stroke.  Refill your prescription before you run out.  After discharge, you should have regular check-up appointments with your healthcare provider that is prescribing your Xarelto.  In the future your dose may need to be changed if your kidney function or weight changes by a significant amount.  What do you do if you miss a dose? If you are taking Xarelto ONCE DAILY and you miss a dose, take it as soon as you remember on the same day then continue your regularly scheduled once daily regimen the next day. Do not take two doses of Xarelto at the same time or  on the same day.   Important Safety Information A possible side effect of Xarelto is bleeding. You should call your healthcare provider right away if you experience any of the following: ? Bleeding from an injury or your nose that does not stop. ? Unusual colored urine (red or dark brown) or unusual colored stools (red or black). ? Unusual bruising for unknown reasons. ? A serious fall or if you hit your head (even if there is no bleeding).  Some medicines may interact with Xarelto and might increase your risk of bleeding while on Xarelto. To help avoid this, consult your healthcare provider or pharmacist prior to using any new prescription or non-prescription medications, including herbals, vitamins, non-steroidal anti-inflammatory drugs (NSAIDs) and supplements.  This website has more information on Xarelto: [email or web address]-benson.[email or web address]  - 1)You are taking Xarelto which is a blood  thinner so Avoid ibuprofen/Advil/Aleve/Motrin/Goody Powders/Naproxen/BC powders/Meloxicam/Diclofenac/Indomethacin and other Nonsteroidal anti-inflammatory medications as these will make you more likely to bleed and can cause stomach ulcers, can also cause Kidney problems.   2) follow-up with pulmonologist Dr. Ina Homes as scheduled  3) follow-up with cardiologist Dr. Curt Bears as scheduled  4) use incentive spirometry and flutter valve as instructed to help your lung expansion

## 2019-04-08 NOTE — Consult Note (Signed)
NAME:  [name], MRN:  [id number], DOB:  [birth year], LOS: 2 ADMISSION DATE:  04/06/2019, CONSULTATION DATE:  11/14/20520 REFERRING MD:  Denton Brick CHIEF COMPLAINT: Right lung collapse    Brief History   68yo M admitted with SOB, orthopnea, unable to lie flat, cough productive of brown sputum, s/p ablation for Af 11/10, developed pericarditis post ablation. CT with RLL collapse. PCCM consulted for recommendations.   History of present illness   68yo M PMH HTN, gout, chronic persistent AF s/p ablation 11/10, BPH, thyroid nodule admitted 11/12 with SOB, orthopnea, unable to lie flat, cough productive of brown sputum. Symptoms all  Began /p ablation for Af 11/10 (was intubated for procedure). He also developed pericarditis post ablation. CT with RLL collapse. PCCM consulted for recommendations.  Past Medical History   Past Medical History:  Diagnosis Date  . Arthritis    "knuckles maybe" (02/28/2018)  . Atrial fib/flutter, transient    a. s/p TEE-guided DCCV on 12/14/2017 with return to NSR.   [name] BPH (benign prostatic hyperplasia) 12/11/2017  . Essential hypertension   . Gout    "I take RX qd" (02/28/2018)  . Neuropathy   . Pneumonia 11/2017  . Thyroid nodule    Benign     Significant Hospital Events   11/12 Admitted (11/10 outpatient ablation)  Consults:  PCCM   Procedures:  NA   Significant Diagnostic Tests:  11/13 CT chest /w,  partial collapse of the right lower lobe. There is significant atelectasis in volume loss involving the right middle lobe. There is no obvious endoluminal filling defect within the right lower lobe bronchus.  11/12 Echo  LVEF  is 45 to 50%. The left ventricle has mildly decreased function. There is mildly increased left ventricular hypertrophy. G1DD. LA mildly dilated.  Pericardial fat pad.  Circumferential pericardial effusion.  Trivial TR.  TR signal is inadequate for assessing pulmonary artery systolic pressure. Micro Data:  SARS  Coronavirus 2 negative MRSA surveillance negative  Blood 11/12>NGTD   Antimicrobials:  Cefepime 11/12>>  Interim history/subjective:  Patient states he is feeling better since admission however he still unable to lie flat.  700 cc witnessed on IS.   Objective   Blood pressure (!) 130/58, pulse 64, temperature 97.6 F (36.4 C), temperature source Oral, resp. rate 15, height 6\' 2"  (1.88 m), weight 126.4 kg, SpO2 97 %.        Intake/Output Summary (Last 24 hours) at 04/08/2019 1047 Last data filed at 04/08/2019 0526 Gross per 24 hour  Intake -  Output 2675 ml  Net -2675 ml   Filed Weights   04/06/19 0210 04/06/19 1950  Weight: 125.2 kg 126.4 kg    Examination: General: Well-developed, well nourished.  Sitting up in chair.  NAD HENT: Normocephalic, PERRL. Moist mucus membranes Neck: No JVD. Trachea midline. No thyromegaly, no lymphadenopathy CV: RRR. S1S2. No MRG. +2 distal pulses Lungs: Breath sounds nearly absent on the right, clear on left. FNL.  ABD: +BS x4. SNT/ND. No masses, guarding or rigidity GU: No Foley EXT: MAE well. Trace edema to lower EXT  Skin: PWD. In tact. No rashes or lesions Neuro: A&Ox3. CN II-XII in tact. No focal deficits Psych: Appropriate mood, insight and judgment for time and situation     Resolved Hospital Problem list   NA   Assessment & Plan:  68 yo M s/p intubation for ablation who has since developed RLL collapse associated with SOB, orthopnea, unable to lie flat, cough productive of brown sputum.  Suspect r/t gastric secretion aspiration with possible diaphragmatic weakness 2/2 procedure/anesthesia.  Recommendations Mucolytic-increased to 1200mg  BID-would continue at D/c for at least 7 days Sniff test IS and flutter valve Continue diuresis  D/c cefepime.  Augmentin 875/125 BID x 7 days F/u PCCM clinic with CXR 2 weeks-will need to call for appt.   Thank you for the opportunity to participate in this patient's care.   Labs   CBC:  Recent Labs  Lab 04/06/19 0223 04/07/19 0249 04/08/19 0244  WBC 11.7* 8.7 7.0  HGB 12.3* 11.8* 11.9*  HCT 37.6* 35.3* 36.8*  MCV 92.4 90.3 91.5  PLT 120* 126* 122*    Basic Metabolic Panel: Recent Labs  Lab 04/06/19 0223 04/06/19 0716 04/07/19 0249 04/08/19 0244  NA 137  --  138 139  K 3.7  --  4.1 3.9  CL 104  --  108 106  CO2 24  --  21* 23  GLUCOSE 145*  --  106* 131*  BUN 22  --  19 29*  CREATININE 1.02  --  0.91 0.94  CALCIUM 8.2*  --  8.2* 8.3*  MG  --  2.0 2.1  --   PHOS  --  2.3*  --   --    GFR: Estimated Creatinine Clearance: 103.3 mL/min (by C-G formula based on SCr of 0.94 mg/dL). Recent Labs  Lab 04/06/19 0223 04/06/19 0716 04/07/19 0249 04/08/19 0244  PROCALCITON  --  0.20  --   --   WBC 11.7*  --  8.7 7.0  LATICACIDVEN  --  1.1  --   --     Liver Function Tests: Recent Labs  Lab 04/07/19 0249 04/08/19 0244  AST 34 22  ALT 19 23  ALKPHOS 42 45  BILITOT 2.1* 1.3*  PROT 6.1* 6.0*  ALBUMIN 3.2* 3.1*   No results for input(s): LIPASE, AMYLASE in the last 168 hours. No results for input(s): AMMONIA in the last 168 hours.  ABG    Component Value Date/Time   PHART 7.426 04/06/2019 0915   PCO2ART 38.7 04/06/2019 0915   PO2ART 65.8 (L) 04/06/2019 0915   HCO3 25.4 04/06/2019 0915   ACIDBASEDEF 25.4 (H) 04/06/2019 0915   O2SAT 93.4 04/06/2019 0915     Coagulation Profile: No results for input(s): INR, PROTIME in the last 168 hours.  Cardiac Enzymes: No results for input(s): CKTOTAL, CKMB, CKMBINDEX, TROPONINI in the last 168 hours.  HbA1C: No results found for: HGBA1C  CBG: No results for input(s): GLUCAP in the last 168 hours.  Review of Systems: Positives in bold   Gen: Denies fever, chills, weight change, fatigue, night sweats HEENT: Denies blurred vision, double vision, hearing loss, tinnitus, sinus congestion, rhinorrhea, sore throat, neck stiffness, dysphagia PULM: Denies shortness of breath, cough, sputum production,  hemoptysis, wheezing CV: Denies chest pain, edema, orthopnea, paroxysmal nocturnal dyspnea, palpitations GI: Denies abdominal pain, nausea, vomiting, diarrhea, hematochezia, melena, constipation, change in bowel habits GU: Denies dysuria, hematuria, polyuria, oliguria, urethral discharge Endocrine: Denies hot or cold intolerance, polyuria, polyphagia or appetite change Derm: Denies rash, dry skin, scaling or peeling skin change Heme: Denies easy bruising, bleeding, bleeding gums Neuro: Denies headache, numbness, weakness, slurred speech, loss of memory or consciousness   Past Medical History  He,  has a past medical history of Arthritis, Atrial fib/flutter, transient, BPH (benign prostatic hyperplasia) (12/11/2017), Essential hypertension, Gout, Neuropathy, Pneumonia (11/2017), and Thyroid nodule.   Surgical History    Past Surgical History:  Procedure Laterality Date  .  ATRIAL FIBRILLATION ABLATION N/A 04/04/2019   Procedure: ATRIAL FIBRILLATION ABLATION;  Surgeon: Constance Haw, MD;  Location: [place] [facility];  Service: Cardiovascular;  Laterality: N/A;  . BACK SURGERY    . BIOPSY THYROID    . CARDIOVERSION N/A 12/14/2017   Procedure: CARDIOVERSION;  Surgeon: Satira Sark, MD;  Location: [facility];  Service: Cardiovascular;  Laterality: N/A;  . CARDIOVERSION N/A 01/14/2019   Procedure: CARDIOVERSION;  Surgeon: Constance Haw, MD;  Location: [place];  Service: Cardiovascular;  Laterality: N/A;  . CATARACT EXTRACTION W/ INTRAOCULAR LENS  IMPLANT, BILATERAL Bilateral 2006-2010   "right-left"  . COLONOSCOPY  2012  . IRRIGATION AND DEBRIDEMENT SEBACEOUS CYST    . LUMBAR LAMINECTOMY N/A 01/20/2013   Procedure: MICRODISCECTOMY LUMBAR LAMINECTOMY;  Surgeon: Marybelle Killings, MD;  Location: [place];  Service: Orthopedics;  Laterality: N/A;  L4-5 Decompression  . PILONIDAL CYST DRAINAGE    . TEE WITHOUT CARDIOVERSION N/A 12/14/2017   Procedure: TRANSESOPHAGEAL ECHOCARDIOGRAM  (TEE) WITH PROPOFOL;  Surgeon: Satira Sark, MD;  Location: [facility];  Service: Cardiovascular;  Laterality: N/A;     Social History   reports that he has never smoked. He has never used smokeless tobacco. He reports that he does not drink alcohol or use drugs.   Family History   His family history includes Atrial fibrillation in his mother; Cancer - Lung in his father; Dementia in his mother; Hypertension in an other family member; Liver cancer in his brother; Lung cancer in his father; Transient ischemic attack in his mother.   Allergies Allergies  Allergen Reactions  . Azithromycin     QT WAVE     Home Medications  Prior to Admission medications   Medication Sig Start Date End Date Taking? Authorizing Provider  acetaminophen (TYLENOL) 500 MG tablet Take 1,000 mg by mouth every 6 (six) hours as needed (for pain.).    Yes [provider]  allopurinol (ZYLOPRIM) 300 MG tablet Take 300 mg by mouth daily.    Yes [provider]  amLODipine (NORVASC) 5 MG tablet TAKE ONE TABLET BY MOUTH DAILY. (LUNCH TIME) Patient taking differently: Take 5 mg by mouth daily.  02/03/19  Yes Herminio Commons, MD  Calcium Carb-Cholecalciferol (CALCIUM + D3 PO) Take 1 tablet by mouth daily at 3 pm.    Yes [provider]  carvedilol (COREG) 12.5 MG tablet Take 18.75 mg by mouth 2 (two) times daily with a meal.   Yes [provider]  cetirizine (ZYRTEC) 10 MG tablet Take 10 mg by mouth daily.    Yes [provider]  CINNAMON PO Take 350 mg by mouth daily at 3 pm.    Yes [provider]  Coenzyme Q10 200 MG capsule Take 400 mg by mouth daily in the afternoon.    Yes [provider]  dofetilide (TIKOSYN) 250 MCG capsule TAKE ONE CAPSULE BY MOUTH TWICE DAILY. Patient taking differently: Take 250 mcg by mouth 2 (two) times daily.  03/27/19  Yes Camnitz, Will Hassell Done, MD  Ferrous Gluconate-C-Folic Acid (IRON-C PO) Take 1 tablet by mouth daily.    Yes [provider]  Glucosamine-Chondroitin (GLUCOSAMINE CHONDR COMPLEX PO) Take 1 tablet by mouth daily.    Yes [provider]  LUTEIN-ZEAXANTHIN PO Take 2 tablets by mouth daily in the afternoon.    Yes [provider]  Multiple Vitamins-Minerals (MULTIVITAMIN PO) Take 1 tablet by mouth daily at 3 pm.    Yes [provider]  olmesartan (BENICAR) 40 MG tablet TAKE ONE TABLET BY MOUTH DAILY Patient taking differently: Take 40 mg by mouth daily.  12/27/18  Yes Herminio Commons, MD  Omega-3 Fatty Acids (OMEGA 3 PO) Take 1,350 mg by mouth daily.   Yes [provider]  PHOSPHATIDYLCHOLINE PO Take 2 tablets by mouth daily in the afternoon.    Yes [provider]  potassium chloride (KLOR-CON) 10 MEQ tablet TAKE ONE TABLET BY MOUTH TWICE DAILY. Patient taking differently: Take 20 mEq by mouth daily.  03/27/19  Yes Camnitz, Ocie Doyne, MD  Probiotic Product (PROBIOTIC DAILY PO) Take 2 tablets by mouth daily in the afternoon.    Yes [provider]  RESVERATROL 100 MG CAPS Take 100 mg by mouth daily at 3 pm.    Yes [provider]  tamsulosin (FLOMAX) 0.4 MG CAPS capsule Take 0.4 mg by mouth daily.  08/06/15  Yes [provider]  XARELTO 20 MG TABS tablet TAKE ONE TABLET BY MOUTH DAILY WITH LUNCH Patient taking differently: Take 20 mg by mouth daily with supper.  12/27/18  Yes Herminio Commons, MD     Francine Graven, MSN, AGACNP  [facility]

## 2019-04-10 ENCOUNTER — Telehealth: Payer: Self-pay

## 2019-04-10 NOTE — Telephone Encounter (Signed)
Pt scheduled on 04/27/2019-pr

## 2019-04-10 NOTE — Telephone Encounter (Signed)
LMTCB  Please schedule with Dr. Tamala Julian during st week of December.  Thanks.

## 2019-04-10 NOTE — Telephone Encounter (Signed)
-----   Message from Candee Furbish, MD sent at 04/08/2019  3:03 PM EST ----- Regarding: follow up Schedule with me in that first week of December, add on fine  Thanks Dan

## 2019-04-11 ENCOUNTER — Telehealth: Payer: Self-pay | Admitting: Internal Medicine

## 2019-04-11 ENCOUNTER — Telehealth: Payer: Self-pay

## 2019-04-11 DIAGNOSIS — G588 Other specified mononeuropathies: Secondary | ICD-10-CM

## 2019-04-11 DIAGNOSIS — G5680 Other specified mononeuropathies of unspecified upper limb: Secondary | ICD-10-CM

## 2019-04-11 LAB — CULTURE, BLOOD (ROUTINE X 2)
Culture: NO GROWTH
Culture: NO GROWTH
Special Requests: ADEQUATE

## 2019-04-11 NOTE — Telephone Encounter (Signed)
LMTCB

## 2019-04-11 NOTE — Telephone Encounter (Signed)
Pt returning call.  [phone]

## 2019-04-11 NOTE — Telephone Encounter (Signed)
Called and spoke to pt. Pt states while he was admitted he was told that he would have a test regarding his diaphragm. Per pt's discharge documentation it suggests a sniff test, this doesn't look to be ordered yet. Pt has an upcoming appt with Dr. Tamala Julian on 04/27/2019. Discharge documentation was written by another provider, will need clearance from Dr. Tamala Julian before ordering.   Dr. Tamala Julian please advise if you would like sniff test done prior to appt.    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 Tamala Julian appreciated-recommends incentive spirometry, flutter valve, patient is sleeping reclined position, -Plan is for outpatient sniff to document degree of dysfunction in PACS  -Patient may need plication if symptoms last beyond 6 months and failed to resolve spontaneously -Treat with Incruse Ellipta and treat empirically with Augmentin for 5 days for possible concomitant pneumonia

## 2019-04-11 NOTE — Telephone Encounter (Signed)
Please order sniff thank you

## 2019-04-12 NOTE — Telephone Encounter (Signed)
Spoke with patient. Advised him that I would go ahead and place the order for the sniff test to be done before his appt with Dr. Tamala Julian next month. Patient verbalized understanding and expressed his appreciation.   Order has been placed. Nothing further needed.

## 2019-04-17 ENCOUNTER — Ambulatory Visit (HOSPITAL_COMMUNITY)
Admission: RE | Admit: 2019-04-17 | Discharge: 2019-04-17 | Disposition: A | Discharge status: Home / Self Care | Payer: Medicare Other | Source: Ambulatory Visit | Attending: Internal Medicine | Admitting: Internal Medicine

## 2019-04-17 ENCOUNTER — Other Ambulatory Visit: Payer: Self-pay

## 2019-04-17 DIAGNOSIS — J986 Disorders of diaphragm: Secondary | ICD-10-CM | POA: Diagnosis not present

## 2019-04-17 DIAGNOSIS — G588 Other specified mononeuropathies: Secondary | ICD-10-CM | POA: Insufficient documentation

## 2019-04-17 DIAGNOSIS — G5680 Other specified mononeuropathies of unspecified upper limb: Secondary | ICD-10-CM

## 2019-04-18 ENCOUNTER — Ambulatory Visit (HOSPITAL_COMMUNITY)
Admission: RE | Admit: 2019-04-18 | Discharge: 2019-04-18 | Disposition: A | Discharge status: Home / Self Care | Payer: Medicare Other | Source: Ambulatory Visit | Attending: Physician Assistant | Admitting: Physician Assistant

## 2019-04-18 ENCOUNTER — Encounter (HOSPITAL_COMMUNITY): Payer: Self-pay | Admitting: Physician Assistant

## 2019-04-18 VITALS — BP 132/74 | HR 67 | Ht 74.0 in | Wt 269.8 lb

## 2019-04-18 DIAGNOSIS — I4819 Other persistent atrial fibrillation: Secondary | ICD-10-CM | POA: Insufficient documentation

## 2019-04-18 DIAGNOSIS — I480 Paroxysmal atrial fibrillation: Secondary | ICD-10-CM | POA: Insufficient documentation

## 2019-04-18 DIAGNOSIS — N400 Enlarged prostate without lower urinary tract symptoms: Secondary | ICD-10-CM | POA: Diagnosis not present

## 2019-04-18 DIAGNOSIS — I10 Essential (primary) hypertension: Secondary | ICD-10-CM | POA: Insufficient documentation

## 2019-04-18 DIAGNOSIS — Z79899 Other long term (current) drug therapy: Secondary | ICD-10-CM | POA: Diagnosis not present

## 2019-04-18 DIAGNOSIS — Z7901 Long term (current) use of anticoagulants: Secondary | ICD-10-CM | POA: Diagnosis not present

## 2019-04-18 DIAGNOSIS — M109 Gout, unspecified: Secondary | ICD-10-CM | POA: Diagnosis not present

## 2019-04-18 DIAGNOSIS — D6869 Other thrombophilia: Secondary | ICD-10-CM | POA: Diagnosis not present

## 2019-04-18 DIAGNOSIS — Z8249 Family history of ischemic heart disease and other diseases of the circulatory system: Secondary | ICD-10-CM | POA: Diagnosis not present

## 2019-04-18 MED ORDER — POTASSIUM CHLORIDE CRYS ER 10 MEQ PO TBCR: 20.0000 meq | EXTENDED_RELEASE_TABLET | Freq: Every day | ORAL | 3 refills | Status: DC

## 2019-04-18 MED ORDER — FUROSEMIDE 20 MG PO TABS: 20.0000 mg | ORAL_TABLET | Freq: Every day | ORAL | 2 refills | Status: DC

## 2019-04-18 NOTE — Progress Notes (Addendum)
Primary Care Physician: Glenda Chroman, MD Referring Physician: Self referred  EP: Dr. Nunzio Cory is a 68 y.o. male with a h/o paroxysmal afib that is in the afib clinic as he noted some heart irregularity over the weekend. He described this to Dr. Lovena Le as well when he was seen mid September and he suggested change to amiodarone or ablation. His left atrium is 52 and pt is leary of the side effects of amiodarone. He has been maintained on Tikosyn. He is on Xarelto for a CHADS2VAC score of 4.  On follow up today, patient is s/p afib ablation with Dr Curt Bears on 04/04/19. He was hospitalized post ablation with CP and SOB post ablation. He was started on colchicine for suspected pericarditis. 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 He is followed by Dr Tamala Julian. Today, patient reports his SOB has improved but is persistent. He also reports some hoarseness in his voice since the procedure. He denies any heart racing or palpitations. He denies CP, swallowing, or groin issues.  Today, he denies symptoms of palpitations, chest pain, orthopnea, PND, lower extremity edema, dizziness, presyncope, syncope, or neurologic sequela. The patient is tolerating medications without difficulties and is otherwise without complaint today.   Past Medical History:  Diagnosis Date  . Arthritis    "knuckles maybe" (02/28/2018)  . Atrial fib/flutter, transient    a. s/p TEE-guided DCCV on 12/14/2017 with return to NSR.   [name] BPH (benign prostatic hyperplasia) 12/11/2017  . Essential hypertension   . Gout    "I take RX qd" (02/28/2018)  . Neuropathy   . Pneumonia 11/2017  . Thyroid nodule    Benign   Past Surgical History:  Procedure Laterality Date  . ATRIAL FIBRILLATION ABLATION N/A 04/04/2019   Procedure: ATRIAL FIBRILLATION ABLATION;  Surgeon: Constance Haw, MD;  Location: [facility];  Service: Cardiovascular;   Laterality: N/A;  . BACK SURGERY    . BIOPSY THYROID    . CARDIOVERSION N/A 12/14/2017   Procedure: CARDIOVERSION;  Surgeon: Satira Sark, MD;  Location: [facility];  Service: Cardiovascular;  Laterality: N/A;  . CARDIOVERSION N/A 01/14/2019   Procedure: CARDIOVERSION;  Surgeon: Constance Haw, MD;  Location: [place];  Service: Cardiovascular;  Laterality: N/A;  . CATARACT EXTRACTION W/ INTRAOCULAR LENS  IMPLANT, BILATERAL Bilateral 2006-2010   "right-left"  . COLONOSCOPY  2012  . IRRIGATION AND DEBRIDEMENT SEBACEOUS CYST    . LUMBAR LAMINECTOMY N/A 01/20/2013   Procedure: MICRODISCECTOMY LUMBAR LAMINECTOMY;  Surgeon: Marybelle Killings, MD;  Location: [facility];  Service: Orthopedics;  Laterality: N/A;  L4-5 Decompression  . PILONIDAL CYST DRAINAGE    . TEE WITHOUT CARDIOVERSION N/A 12/14/2017   Procedure: TRANSESOPHAGEAL ECHOCARDIOGRAM (TEE) WITH PROPOFOL;  Surgeon: Satira Sark, MD;  Location: [facility];  Service: Cardiovascular;  Laterality: N/A;    Current Outpatient Medications  Medication Sig Dispense Refill  . acetaminophen (TYLENOL) 500 MG tablet Take 1,000 mg by mouth every 6 (six) hours as needed (for pain.).     [name] allopurinol (ZYLOPRIM) 300 MG tablet Take 300 mg by mouth daily.     [name] amLODipine (NORVASC) 5 MG tablet Take 0.5 tablets (2.5 mg total) by mouth daily. 45 tablet 1  . Calcium Carb-Cholecalciferol (CALCIUM + D3 PO) Take 1 tablet by mouth daily at 3 pm.     . carvedilol (COREG) 12.5 MG tablet Take 18.75 mg  by mouth 2 (two) times daily with a meal.    . cetirizine (ZYRTEC) 10 MG tablet Take 10 mg by mouth daily.     [name] CINNAMON PO Take 350 mg by mouth daily at 3 pm.     . Coenzyme Q10 200 MG capsule Take 400 mg by mouth daily in the afternoon.     . colchicine 0.6 MG tablet Take 1 tablet (0.6 mg total) by mouth daily. 30 tablet 1  . dofetilide (TIKOSYN) 250 MCG capsule TAKE ONE CAPSULE BY MOUTH TWICE DAILY. (Patient taking differently: Take 250 mcg by mouth 2 (two)  times daily. ) 180 capsule 2  . Ferrous Gluconate-C-Folic Acid (IRON-C PO) Take 1 tablet by mouth daily.    . furosemide (LASIX) 20 MG tablet Take 1 tablet (20 mg total) by mouth 2 (two) times daily. 60 tablet 2  . Glucosamine-Chondroitin (GLUCOSAMINE CHONDR COMPLEX PO) Take 1 tablet by mouth daily.     [name] guaiFENesin (MUCINEX) 600 MG 12 hr tablet Take 2 tablets (1,200 mg total) by mouth 2 (two) times daily. 20 tablet 0  . LUTEIN-ZEAXANTHIN PO Take 2 tablets by mouth daily in the afternoon.     . Multiple Vitamins-Minerals (MULTIVITAMIN PO) Take 1 tablet by mouth daily at 3 pm.     . olmesartan (BENICAR) 40 MG tablet TAKE ONE TABLET BY MOUTH DAILY (Patient taking differently: Take 40 mg by mouth daily. ) 30 tablet 6  . Omega-3 Fatty Acids (OMEGA 3 PO) Take 1,350 mg by mouth daily.    [name] PHOSPHATIDYLCHOLINE PO Take 2 tablets by mouth daily in the afternoon.     . potassium chloride (KLOR-CON) 10 MEQ tablet Take 1 tablet (10 mEq total) by mouth See admin instructions. Take 2 tablets (20 mEq) every morning and 1 tablet  (10 Meq) every evening 180 tablet 3  . Probiotic Product (PROBIOTIC DAILY PO) Take 2 tablets by mouth daily in the afternoon.     [name] RESVERATROL 100 MG CAPS Take 100 mg by mouth daily at 3 pm.     . tamsulosin (FLOMAX) 0.4 MG CAPS capsule Take 0.4 mg by mouth daily.   5  . umeclidinium bromide (INCRUSE ELLIPTA) 62.5 MCG/INH AEPB Inhale 1 puff into the lungs daily. 30 each 1  . XARELTO 20 MG TABS tablet TAKE ONE TABLET BY MOUTH DAILY WITH LUNCH (Patient taking differently: Take 20 mg by mouth daily with supper. ) 30 tablet 11   No current facility-administered medications for this visit.     Allergies  Allergen Reactions  . Azithromycin     QT WAVE    Social History   Socioeconomic History  . Marital status: Married    Spouse name: Not on file  . Number of children: 3  . Years of education: Not on file  . Highest education level: Not on file  Occupational History  . Not on  file  Social Needs  . Financial resource strain: Not on file  . Food insecurity    Worry: Not on file    Inability: Not on file  . Transportation needs    Medical: Not on file    Non-medical: Not on file  Tobacco Use  . Smoking status: Never Smoker  . Smokeless tobacco: Never Used  Substance and Sexual Activity  . Alcohol use: Never    Alcohol/week: 0.0 standard drinks    Frequency: Never  . Drug use: Never  . Sexual activity: Not Currently  Lifestyle  . Physical activity  Days per week: Not on file    Minutes per session: Not on file  . Stress: Not on file  Relationships  . Social Herbalist on phone: Not on file    Gets together: Not on file    Attends religious service: Not on file    Active member of club or organization: Not on file    Attends meetings of clubs or organizations: Not on file    Relationship status: Not on file  . Intimate partner violence    Fear of current or ex partner: Not on file    Emotionally abused: Not on file    Physically abused: Not on file    Forced sexual activity: Not on file  Other Topics Concern  . Not on file  Social History Narrative  . Not on file    Family History  Problem Relation Age of Onset  . Lung cancer Father   . Cancer - Lung Father   . Atrial fibrillation Mother   . Dementia Mother   . Transient ischemic attack Mother   . Hypertension Other   . Liver cancer Brother     ROS- All systems are reviewed and negative except as per the HPI above  Physical Exam: There were no vitals filed for this visit. Wt Readings from Last 3 Encounters:  04/06/19 278 lb 10.6 oz (126.4 kg)  04/04/19 276 lb (125.2 kg)  02/27/19 275 lb 14.4 oz (125.1 kg)    Labs: Lab Results  Component Value Date   NA 139 04/08/2019   K 3.9 04/08/2019   CL 106 04/08/2019   CO2 23 04/08/2019   GLUCOSE 131 (H) 04/08/2019   BUN 29 (H) 04/08/2019   CREATININE 0.94 04/08/2019   CALCIUM 8.3 (L) 04/08/2019   PHOS 2.3 (L)  04/06/2019   MG 2.1 04/07/2019   Lab Results  Component Value Date   INR 1.08 12/11/2017   No results found for: CHOL, HDL, LDLCALC, TRIG  GEN- The patient is well appearing obese male, alert and oriented x 3 today.   HEENT-head normocephalic, atraumatic, sclera clear, conjunctiva pink, hearing intact, trachea midline. Lungs- Clear to ausculation L, diminished breath sounds on R, normal work of breathing Heart- Regular rate and rhythm, no murmurs, rubs or gallops  GI- soft, NT, ND, + BS Extremities- no clubbing, cyanosis, or edema MS- no significant deformity or atrophy Skin- no rash or lesion Psych- euthymic mood, full affect Neuro- strength and sensation are intact   EKG- SR HR 67, PR 174, QRS 92, QTc 458  Echo-04/06/19 1. Left ventricular ejection fraction, by visual estimation, is 45 to 50%. The left ventricle has mildly decreased function. There is mildly increased left ventricular hypertrophy.  2. The left ventricle demonstrates global hypokinesis.  3. Left ventricular diastolic parameters are consistent with Grade I diastolic dysfunction (impaired relaxation).  4. Global right ventricle has normal systolic function.The right ventricular size is normal. No increase in right ventricular wall thickness.  5. Left atrial size was mildly dilated.  6. Right atrial size was normal.  7. Presence of pericardial fat pad.  8. The pericardial effusion is circumferential.  9. Trivial pericardial effusion is present. 10. The mitral valve is grossly normal. Mild mitral valve regurgitation. No evidence of mitral stenosis. 11. The tricuspid valve is grossly normal. Tricuspid valve regurgitation is trivial. 12. The aortic valve is tricuspid. Aortic valve regurgitation is not visualized. No evidence of aortic valve sclerosis or stenosis. 13. The pulmonic valve  was grossly normal. Pulmonic valve regurgitation is trivial. 14. TR signal is inadequate for assessing pulmonary artery systolic  pressure.   Assessment and Plan: 1.Paroxysmal atrial fibrillation S/p ablation with Dr Curt Bears 04/04/19. Patient hospitalized with suspected pericarditis and phrenic nerve palsy following ablation. Patient appears to be maintaining SR. Continue dofetilide 250 mcg BID. QT stable. Continue  carvedilol 12.5 mg 1 1/2 tab BID Continue Xarelto 20 mg daily with no missed doses for at least 3 months post ablation. Will decrease Lasix to 20 mg daily and K+ to 20 meq daily. Patient will weigh daily, can increase again if he notes weight increase.  This patients CHA2DS2-VASc Score and unadjusted Ischemic Stroke Rate (% per year) is equal to 4.8 % stroke rate/year from a score of 4  Above score calculated as 1 point each if present [CHF, HTN, DM, Vascular=MI/PAD/Aortic Plaque, Age if 65-74, or Male] Above score calculated as 2 points each if present [Age > 75, or Stroke/TIA/TE]  2. HTN Stable, no changes today.   Follow up with Dr Curt Bears as scheduled. Sooner if he has more issues.   [facility] [address] [place], [place] [address] [phone]

## 2019-04-18 NOTE — Patient Instructions (Signed)
Decrease Lasix 20mg  daily and Potassium 35mEq daily

## 2019-04-25 DIAGNOSIS — E7800 Pure hypercholesterolemia, unspecified: Secondary | ICD-10-CM | POA: Diagnosis not present

## 2019-04-25 DIAGNOSIS — I10 Essential (primary) hypertension: Secondary | ICD-10-CM | POA: Diagnosis not present

## 2019-04-25 DIAGNOSIS — Z7189 Other specified counseling: Secondary | ICD-10-CM | POA: Diagnosis not present

## 2019-04-25 DIAGNOSIS — Z1331 Encounter for screening for depression: Secondary | ICD-10-CM | POA: Diagnosis not present

## 2019-04-25 DIAGNOSIS — R5383 Other fatigue: Secondary | ICD-10-CM | POA: Diagnosis not present

## 2019-04-25 DIAGNOSIS — Z0000 Encounter for general adult medical examination without abnormal findings: Secondary | ICD-10-CM | POA: Diagnosis not present

## 2019-04-25 DIAGNOSIS — I429 Cardiomyopathy, unspecified: Secondary | ICD-10-CM | POA: Diagnosis not present

## 2019-04-25 DIAGNOSIS — Z299 Encounter for prophylactic measures, unspecified: Secondary | ICD-10-CM | POA: Diagnosis not present

## 2019-04-25 DIAGNOSIS — Z79899 Other long term (current) drug therapy: Secondary | ICD-10-CM | POA: Diagnosis not present

## 2019-04-25 DIAGNOSIS — Z6834 Body mass index (BMI) 34.0-34.9, adult: Secondary | ICD-10-CM | POA: Diagnosis not present

## 2019-04-25 DIAGNOSIS — Z1339 Encounter for screening examination for other mental health and behavioral disorders: Secondary | ICD-10-CM | POA: Diagnosis not present

## 2019-04-25 DIAGNOSIS — Z1211 Encounter for screening for malignant neoplasm of colon: Secondary | ICD-10-CM | POA: Diagnosis not present

## 2019-04-27 ENCOUNTER — Other Ambulatory Visit: Payer: Self-pay

## 2019-04-27 ENCOUNTER — Encounter: Payer: Self-pay | Admitting: Internal Medicine

## 2019-04-27 ENCOUNTER — Ambulatory Visit (INDEPENDENT_AMBULATORY_CARE_PROVIDER_SITE_OTHER): Payer: Medicare Other | Admitting: Internal Medicine

## 2019-04-27 VITALS — BP 152/80 | HR 65 | Temp 97.6°F | Ht 74.0 in | Wt 271.0 lb

## 2019-04-27 DIAGNOSIS — J986 Disorders of diaphragm: Secondary | ICD-10-CM

## 2019-04-27 NOTE — Patient Instructions (Signed)
We will re-order a sniff test for May 2021.   We will follow up after the scan.   Please call if you need anything.

## 2019-04-27 NOTE — Progress Notes (Signed)
Synopsis: Referred in Nov 2020 for R hemidiaphragm failure  Subjective:   PATIENT ID: [name] GENDER: male DOB: May 20, 1951, MRN: [id number]  Chief Complaint  Patient presents with  . Hospitalization Follow-up    ED 11/12 for Dyspnea. He reports his breathing has been a little better since D/C. He is using incruse daily but does not feel a big difference using it.     HPI Patient with Afib who developed significant orthopnea and DOE after afib ablation procedure.  Subsequent workup revealed R phrenic nerve palsy.  Still has DOE and orthopnea but less so than when seen in hospital last month.  No interim ER visits.  Occasionally has congestion on R that self-resolves.  Taking lasix but wants to know if he can come off of it.  ROS + symptoms in bold Fevers, chills, weight loss Nausea, vomiting, diarrhea Shortness of breath, wheezing, cough Chest pain, palpitations, lower ext edema  Past Medical History:  Diagnosis Date  . Arthritis    "knuckles maybe" (02/28/2018)  . Atrial fib/flutter, transient    a. s/p TEE-guided DCCV on 12/14/2017 with return to NSR.   [name] BPH (benign prostatic hyperplasia) 12/11/2017  . Essential hypertension   . Gout    "I take RX qd" (02/28/2018)  . Neuropathy   . Pneumonia 11/2017  . Thyroid nodule    Benign     Family History  Problem Relation Age of Onset  . Lung cancer Father   . Cancer - Lung Father   . Atrial fibrillation Mother   . Dementia Mother   . Transient ischemic attack Mother   . Hypertension Other   . Liver cancer Brother      Past Surgical History:  Procedure Laterality Date  . ATRIAL FIBRILLATION ABLATION N/A 04/04/2019   Procedure: ATRIAL FIBRILLATION ABLATION;  Surgeon: Constance Haw, MD;  Location: [facility];  Service: Cardiovascular;  Laterality: N/A;  . BACK SURGERY    . BIOPSY THYROID    . CARDIOVERSION N/A 12/14/2017   Procedure: CARDIOVERSION;  Surgeon: Satira Sark, MD;  [facility]: [facility];  Service: Cardiovascular;  Laterality: N/A;  . CARDIOVERSION N/A 01/14/2019   Procedure: CARDIOVERSION;  Surgeon: Constance Haw, MD;  Location: [place];  Service: Cardiovascular;  Laterality: N/A;  . CATARACT EXTRACTION W/ INTRAOCULAR LENS  IMPLANT, BILATERAL Bilateral 2006-2010   "right-left"  . COLONOSCOPY  2012  . IRRIGATION AND DEBRIDEMENT SEBACEOUS CYST    . LUMBAR LAMINECTOMY N/A 01/20/2013   Procedure: MICRODISCECTOMY LUMBAR LAMINECTOMY;  Surgeon: Marybelle Killings, MD;  Location: [facility];  Service: Orthopedics;  Laterality: N/A;  L4-5 Decompression  . PILONIDAL CYST DRAINAGE    . TEE WITHOUT CARDIOVERSION N/A 12/14/2017   Procedure: TRANSESOPHAGEAL ECHOCARDIOGRAM (TEE) WITH PROPOFOL;  Surgeon: Satira Sark, MD;  Location: [facility];  Service: Cardiovascular;  Laterality: N/A;    Social History   Socioeconomic History  . Marital status: Married    Spouse name: Not on file  . Number of children: 3  . Years of education: Not on file  . Highest education level: Not on file  Occupational History  . Not on file  Social Needs  . Financial resource strain: Not on file  . Food insecurity    Worry: Not on file    Inability: Not on file  . Transportation needs    Medical: Not on file    Non-medical: Not on file  Tobacco Use  . Smoking status: Never Smoker  .  Smokeless tobacco: Never Used  Substance and Sexual Activity  . Alcohol use: Never    Alcohol/week: 0.0 standard drinks    Frequency: Never  . Drug use: Never  . Sexual activity: Not Currently  Lifestyle  . Physical activity    Days per week: Not on file    Minutes per session: Not on file  . Stress: Not on file  Relationships  . Social Herbalist on phone: Not on file    Gets together: Not on file    Attends religious service: Not on file    Active member of club or organization: Not on file    Attends meetings of clubs or organizations: Not on file    Relationship status: Not on file  .  Intimate partner violence    Fear of current or ex partner: Not on file    Emotionally abused: Not on file    Physically abused: Not on file    Forced sexual activity: Not on file  Other Topics Concern  . Not on file  Social History Narrative  . Not on file     Allergies  Allergen Reactions  . Azithromycin     QT WAVE     Outpatient Medications Prior to Visit  Medication Sig Dispense Refill  . acetaminophen (TYLENOL) 500 MG tablet Take 1,000 mg by mouth every 6 (six) hours as needed (for pain.).     [name] allopurinol (ZYLOPRIM) 300 MG tablet Take 300 mg by mouth daily.     [name] amLODipine (NORVASC) 5 MG tablet Take 0.5 tablets (2.5 mg total) by mouth daily. 45 tablet 1  . Calcium Carb-Cholecalciferol (CALCIUM + D3 PO) Take 1 tablet by mouth daily at 3 pm.     . carvedilol (COREG) 12.5 MG tablet Take 18.75 mg by mouth 2 (two) times daily with a meal.    . cetirizine (ZYRTEC) 10 MG tablet Take 10 mg by mouth daily.     [name] CINNAMON PO Take 350 mg by mouth daily at 3 pm.     . Coenzyme Q10 200 MG capsule Take 400 mg by mouth daily in the afternoon.     . colchicine 0.6 MG tablet Take 1 tablet (0.6 mg total) by mouth daily. 30 tablet 1  . dofetilide (TIKOSYN) 250 MCG capsule TAKE ONE CAPSULE BY MOUTH TWICE DAILY. (Patient taking differently: Take 250 mcg by mouth 2 (two) times daily. ) 180 capsule 2  . Ferrous Gluconate-C-Folic Acid (IRON-C PO) Take 1 tablet by mouth daily.    . furosemide (LASIX) 20 MG tablet Take 1 tablet (20 mg total) by mouth daily. 30 tablet 2  . Glucosamine-Chondroitin (GLUCOSAMINE CHONDR COMPLEX PO) Take 1 tablet by mouth daily.     [name] guaiFENesin (MUCINEX) 600 MG 12 hr tablet Take 2 tablets (1,200 mg total) by mouth 2 (two) times daily. 20 tablet 0  . LUTEIN-ZEAXANTHIN PO Take 2 tablets by mouth daily in the afternoon.     . Multiple Vitamins-Minerals (MULTIVITAMIN PO) Take 1 tablet by mouth daily at 3 pm.     . olmesartan (BENICAR) 40 MG tablet TAKE ONE TABLET BY MOUTH  DAILY (Patient taking differently: Take 40 mg by mouth daily. ) 30 tablet 6  . Omega-3 Fatty Acids (OMEGA 3 PO) Take 1,350 mg by mouth daily.    [name] PHOSPHATIDYLCHOLINE PO Take 2 tablets by mouth daily in the afternoon.     . potassium chloride (KLOR-CON) 10 MEQ tablet Take 2 tablets (20  mEq total) by mouth daily. Take 2 tablets (20 mEq) every morning and 1 tablet  (10 Meq) every evening 180 tablet 3  . Probiotic Product (PROBIOTIC DAILY PO) Take 2 tablets by mouth daily in the afternoon.     [name] RESVERATROL 100 MG CAPS Take 100 mg by mouth daily at 3 pm.     . tamsulosin (FLOMAX) 0.4 MG CAPS capsule Take 0.4 mg by mouth daily.   5  . umeclidinium bromide (INCRUSE ELLIPTA) 62.5 MCG/INH AEPB Inhale 1 puff into the lungs daily. 30 each 1  . XARELTO 20 MG TABS tablet TAKE ONE TABLET BY MOUTH DAILY WITH LUNCH (Patient taking differently: Take 20 mg by mouth daily with supper. ) 30 tablet 11   No facility-administered medications prior to visit.      Objective:  GEN: elderly man in NAD HEENT: MMM, no thrush CV: RRR, ext warm PULM: Diminished R base, left clear GI: Soft, +BS EXT: No edema or clubbing NEURO: Moves all 4 ext to command PSYCH: RASS 0 SKIN: No rashes   Vitals:   04/27/19 1027  BP: (!) 152/80  Pulse: 65  Temp: 97.6 F (36.4 C)  TempSrc: Temporal  SpO2: 98%  Weight: 271 lb (122.9 kg)  Height: 6\' 2"  (1.88 m)   98% on RA BMI Readings from Last 3 Encounters:  04/27/19 34.79 kg/m  04/18/19 34.64 kg/m  04/06/19 35.78 kg/m   Wt Readings from Last 3 Encounters:  04/27/19 271 lb (122.9 kg)  04/18/19 269 lb 12.8 oz (122.4 kg)  04/06/19 278 lb 10.6 oz (126.4 kg)     CBC    Component Value Date/Time   WBC 7.0 04/08/2019 0244   RBC 4.02 (L) 04/08/2019 0244   HGB 11.9 (L) 04/08/2019 0244   HGB 12.6 (L) 03/20/2019 1152   HCT 36.8 (L) 04/08/2019 0244   HCT 37.8 03/20/2019 1152   PLT 122 (L) 04/08/2019 0244   PLT 139 (L) 03/20/2019 1152   MCV 91.5 04/08/2019 0244    MCV 88 03/20/2019 1152   MCH 29.6 04/08/2019 0244   MCHC 32.3 04/08/2019 0244   RDW 13.0 04/08/2019 0244   RDW 12.7 03/20/2019 1152   LYMPHSABS 1.0 01/13/2019 1605   MONOABS 0.4 01/13/2019 1605   EOSABS 0.1 01/13/2019 1605   BASOSABS 0.0 01/13/2019 1605   Fluoroscopy Nov 2020: R diaphragm paralysis    Assessment & Plan:  # Postprocedural R phrenic nerve palsy  Discussion: Usual course is 6 months supportive care and recheck, then can consider plication or nerve stimulator depending on patient preferences.  Continue IS, exercise, and diaphragm exercises suggested.  Told him fine to change colchicine and lasix to PRN.  Repeat sniff test May and f/u with me.   Current Outpatient Medications:  .  acetaminophen (TYLENOL) 500 MG tablet, Take 1,000 mg by mouth every 6 (six) hours as needed (for pain.). , Disp: , Rfl:  .  allopurinol (ZYLOPRIM) 300 MG tablet, Take 300 mg by mouth daily. , Disp: , Rfl:  .  amLODipine (NORVASC) 5 MG tablet, Take 0.5 tablets (2.5 mg total) by mouth daily., Disp: 45 tablet, Rfl: 1 .  Calcium Carb-Cholecalciferol (CALCIUM + D3 PO), Take 1 tablet by mouth daily at 3 pm. , Disp: , Rfl:  .  carvedilol (COREG) 12.5 MG tablet, Take 18.75 mg by mouth 2 (two) times daily with a meal., Disp: , Rfl:  .  cetirizine (ZYRTEC) 10 MG tablet, Take 10 mg by mouth daily. , Disp: ,  Rfl:  .  CINNAMON PO, Take 350 mg by mouth daily at 3 pm. , Disp: , Rfl:  .  Coenzyme Q10 200 MG capsule, Take 400 mg by mouth daily in the afternoon. , Disp: , Rfl:  .  colchicine 0.6 MG tablet, Take 1 tablet (0.6 mg total) by mouth daily., Disp: 30 tablet, Rfl: 1 .  dofetilide (TIKOSYN) 250 MCG capsule, TAKE ONE CAPSULE BY MOUTH TWICE DAILY. (Patient taking differently: Take 250 mcg by mouth 2 (two) times daily. ), Disp: 180 capsule, Rfl: 2 .  Ferrous Gluconate-C-Folic Acid (IRON-C PO), Take 1 tablet by mouth daily., Disp: , Rfl:  .  furosemide (LASIX) 20 MG tablet, Take 1 tablet (20 mg total) by  mouth daily., Disp: 30 tablet, Rfl: 2 .  Glucosamine-Chondroitin (GLUCOSAMINE CHONDR COMPLEX PO), Take 1 tablet by mouth daily. , Disp: , Rfl:  .  guaiFENesin (MUCINEX) 600 MG 12 hr tablet, Take 2 tablets (1,200 mg total) by mouth 2 (two) times daily., Disp: 20 tablet, Rfl: 0 .  LUTEIN-ZEAXANTHIN PO, Take 2 tablets by mouth daily in the afternoon. , Disp: , Rfl:  .  Multiple Vitamins-Minerals (MULTIVITAMIN PO), Take 1 tablet by mouth daily at 3 pm. , Disp: , Rfl:  .  olmesartan (BENICAR) 40 MG tablet, TAKE ONE TABLET BY MOUTH DAILY (Patient taking differently: Take 40 mg by mouth daily. ), Disp: 30 tablet, Rfl: 6 .  Omega-3 Fatty Acids (OMEGA 3 PO), Take 1,350 mg by mouth daily., Disp: , Rfl:  .  PHOSPHATIDYLCHOLINE PO, Take 2 tablets by mouth daily in the afternoon. , Disp: , Rfl:  .  potassium chloride (KLOR-CON) 10 MEQ tablet, Take 2 tablets (20 mEq total) by mouth daily. Take 2 tablets (20 mEq) every morning and 1 tablet  (10 Meq) every evening, Disp: 180 tablet, Rfl: 3 .  Probiotic Product (PROBIOTIC DAILY PO), Take 2 tablets by mouth daily in the afternoon. , Disp: , Rfl:  .  RESVERATROL 100 MG CAPS, Take 100 mg by mouth daily at 3 pm. , Disp: , Rfl:  .  tamsulosin (FLOMAX) 0.4 MG CAPS capsule, Take 0.4 mg by mouth daily. , Disp: , Rfl: 5 .  umeclidinium bromide (INCRUSE ELLIPTA) 62.5 MCG/INH AEPB, Inhale 1 puff into the lungs daily., Disp: 30 each, Rfl: 1 .  XARELTO 20 MG TABS tablet, TAKE ONE TABLET BY MOUTH DAILY WITH LUNCH (Patient taking differently: Take 20 mg by mouth daily with supper. ), Disp: 30 tablet, Rfl: [place], MD [facility] 04/27/2019 11:00 AM

## 2019-05-08 ENCOUNTER — Ambulatory Visit (HOSPITAL_COMMUNITY): Payer: Medicare Other | Admitting: Physician Assistant

## 2019-05-15 ENCOUNTER — Telehealth: Payer: Self-pay | Admitting: Cardiology

## 2019-05-15 NOTE — Telephone Encounter (Signed)
Pt voiced understanding and will f/u with pcp - updated medication list

## 2019-05-15 NOTE — Telephone Encounter (Signed)
Will forward to provider  

## 2019-05-15 NOTE — Telephone Encounter (Signed)
New message    Pt c/o BP issue: STAT if pt c/o blurred vision, one-sided weakness or slurred speech  1. What are your last 5 BP readings? 150/84, 150/82--> 140/78  2. Are you having any other symptoms (ex. Dizziness, headache, blurred vision, passed out)? no  3. What is your BP issue? Patient states his BP is higher than normal, wants to know if he needs to change Norvasc dosage.

## 2019-05-15 NOTE — Telephone Encounter (Signed)
He should speak with his PCP.

## 2019-05-15 NOTE — Telephone Encounter (Signed)
Pt says Dr Curt Bears was monitoring BP - prn with Dr Bronson Ing

## 2019-06-07 DIAGNOSIS — H109 Unspecified conjunctivitis: Secondary | ICD-10-CM | POA: Diagnosis not present

## 2019-06-07 DIAGNOSIS — Z6834 Body mass index (BMI) 34.0-34.9, adult: Secondary | ICD-10-CM | POA: Diagnosis not present

## 2019-06-07 DIAGNOSIS — I10 Essential (primary) hypertension: Secondary | ICD-10-CM | POA: Diagnosis not present

## 2019-06-07 DIAGNOSIS — Z789 Other specified health status: Secondary | ICD-10-CM | POA: Diagnosis not present

## 2019-06-07 DIAGNOSIS — J309 Allergic rhinitis, unspecified: Secondary | ICD-10-CM | POA: Diagnosis not present

## 2019-06-07 DIAGNOSIS — I429 Cardiomyopathy, unspecified: Secondary | ICD-10-CM | POA: Diagnosis not present

## 2019-06-07 DIAGNOSIS — Z299 Encounter for prophylactic measures, unspecified: Secondary | ICD-10-CM | POA: Diagnosis not present

## 2019-06-07 DIAGNOSIS — I4891 Unspecified atrial fibrillation: Secondary | ICD-10-CM | POA: Diagnosis not present

## 2019-06-08 DIAGNOSIS — I10 Essential (primary) hypertension: Secondary | ICD-10-CM | POA: Diagnosis not present

## 2019-06-12 DIAGNOSIS — J301 Allergic rhinitis due to pollen: Secondary | ICD-10-CM | POA: Diagnosis not present

## 2019-06-12 DIAGNOSIS — H10523 Angular blepharoconjunctivitis, bilateral: Secondary | ICD-10-CM | POA: Diagnosis not present

## 2019-06-29 DIAGNOSIS — H04123 Dry eye syndrome of bilateral lacrimal glands: Secondary | ICD-10-CM | POA: Diagnosis not present

## 2019-06-30 ENCOUNTER — Ambulatory Visit (HOSPITAL_COMMUNITY)
Admission: RE | Admit: 2019-06-30 | Discharge: 2019-06-30 | Disposition: A | Discharge status: Home / Self Care | Payer: Medicare Other | Source: Ambulatory Visit | Attending: Nurse Practitioner | Admitting: Nurse Practitioner

## 2019-06-30 ENCOUNTER — Other Ambulatory Visit: Payer: Self-pay

## 2019-06-30 ENCOUNTER — Encounter (HOSPITAL_COMMUNITY): Payer: Self-pay | Admitting: Nurse Practitioner

## 2019-06-30 VITALS — BP 130/74 | HR 90 | Ht 74.0 in | Wt 269.0 lb

## 2019-06-30 DIAGNOSIS — Z7901 Long term (current) use of anticoagulants: Secondary | ICD-10-CM | POA: Insufficient documentation

## 2019-06-30 DIAGNOSIS — M109 Gout, unspecified: Secondary | ICD-10-CM | POA: Diagnosis not present

## 2019-06-30 DIAGNOSIS — I4891 Unspecified atrial fibrillation: Secondary | ICD-10-CM | POA: Diagnosis not present

## 2019-06-30 DIAGNOSIS — I4892 Unspecified atrial flutter: Secondary | ICD-10-CM | POA: Insufficient documentation

## 2019-06-30 DIAGNOSIS — D6869 Other thrombophilia: Secondary | ICD-10-CM | POA: Diagnosis not present

## 2019-06-30 DIAGNOSIS — I480 Paroxysmal atrial fibrillation: Secondary | ICD-10-CM | POA: Diagnosis not present

## 2019-06-30 DIAGNOSIS — Z79899 Other long term (current) drug therapy: Secondary | ICD-10-CM | POA: Diagnosis not present

## 2019-06-30 DIAGNOSIS — I10 Essential (primary) hypertension: Secondary | ICD-10-CM | POA: Insufficient documentation

## 2019-06-30 LAB — BASIC METABOLIC PANEL
Anion gap: 8 (ref 5–15)
BUN: 18 mg/dL (ref 8–23)
CO2: 27 mmol/L (ref 22–32)
Calcium: 8.8 mg/dL — ABNORMAL LOW (ref 8.9–10.3)
Chloride: 106 mmol/L (ref 98–111)
Creatinine, Ser: 1.06 mg/dL (ref 0.61–1.24)
GFR calc Af Amer: >60 mL/min (ref >60)
GFR calc non Af Amer: >60 mL/min (ref >60)
Glucose, Bld: 139 mg/dL — ABNORMAL HIGH (ref 70–99)
Potassium: 4.5 mmol/L (ref 3.5–5.1)
Sodium: 141 mmol/L (ref 135–145)

## 2019-06-30 LAB — TSH: TSH: 2.003 u[IU]/mL (ref 0.350–4.500)

## 2019-06-30 LAB — MAGNESIUM: Magnesium: 2 mg/dL (ref 1.7–2.4)

## 2019-06-30 MED ORDER — CARVEDILOL 12.5 MG PO TABS: 25.0000 mg | ORAL_TABLET | Freq: Two times a day (BID) | ORAL | Status: DC

## 2019-06-30 MED ORDER — POTASSIUM CHLORIDE CRYS ER 10 MEQ PO TBCR: EXTENDED_RELEASE_TABLET | ORAL | Status: DC

## 2019-06-30 NOTE — Patient Instructions (Signed)
Increase coreg to 25 mg twice a day 

## 2019-06-30 NOTE — Progress Notes (Signed)
Primary Care Physician: Glenda Chroman, MD Referring Physician: Dr. Ladora Arnez is a 69 y.o. 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 Hie ekg today shows SR with a run of afib vrs disorganized atrial activity at 90 bpm. HE is on his tikosyn and has not missed any does. He has had some extra stress but otherwise  nothing out of the usual.  Continues on xarelto 20 mg daily for a CHA2DS2VASc score of 4.   Today, he denies symptoms of palpitations, chest pain, shortness of breath, orthopnea, PND, lower extremity edema, dizziness, presyncope, syncope, or neurologic sequela. The patient is tolerating medications without difficulties and is otherwise without complaint today.   Past Medical History:  Diagnosis Date  . Arthritis    "knuckles maybe" (02/28/2018)  . Atrial fib/flutter, transient    a. s/p TEE-guided DCCV on 12/14/2017 with return to NSR.   [name] BPH (benign prostatic hyperplasia) 12/11/2017  . Essential hypertension   . Gout    "I take RX qd" (02/28/2018)  . Neuropathy   . Pneumonia 11/2017  . Thyroid nodule    Benign   Past Surgical History:  Procedure Laterality Date  . ATRIAL FIBRILLATION ABLATION N/A 04/04/2019   Procedure: ATRIAL FIBRILLATION ABLATION;  Surgeon: Constance Haw, MD;  Location: [facility];  Service: Cardiovascular;  Laterality: N/A;  . BACK SURGERY    . BIOPSY THYROID    . CARDIOVERSION N/A 12/14/2017   Procedure: CARDIOVERSION;  Surgeon: Satira Sark, MD;  Location: [facility];  Service: Cardiovascular;  Laterality: N/A;  . CARDIOVERSION N/A 01/14/2019   Procedure: CARDIOVERSION;  Surgeon: Constance Haw, MD;  Location: [facility];  Service: Cardiovascular;  Laterality: N/A;  . CATARACT EXTRACTION W/ INTRAOCULAR LENS  IMPLANT, BILATERAL Bilateral 2006-2010   "right-left"  . COLONOSCOPY  2012  . IRRIGATION AND  DEBRIDEMENT SEBACEOUS CYST    . LUMBAR LAMINECTOMY N/A 01/20/2013   Procedure: MICRODISCECTOMY LUMBAR LAMINECTOMY;  Surgeon: Marybelle Killings, MD;  Location: [facility];  Service: Orthopedics;  Laterality: N/A;  L4-5 Decompression  . PILONIDAL CYST DRAINAGE    . TEE WITHOUT CARDIOVERSION N/A 12/14/2017   Procedure: TRANSESOPHAGEAL ECHOCARDIOGRAM (TEE) WITH PROPOFOL;  Surgeon: Satira Sark, MD;  Location: [facility];  Service: Cardiovascular;  Laterality: N/A;    Current Outpatient Medications  Medication Sig Dispense Refill  . acetaminophen (TYLENOL) 500 MG tablet Take 1,000 mg by mouth as needed (for pain.).     [name] allopurinol (ZYLOPRIM) 300 MG tablet Take 300 mg by mouth daily.     [name] amLODipine (NORVASC) 5 MG tablet Take 5 mg by mouth daily.    . Calcium Carb-Cholecalciferol (CALCIUM + D3 PO) Take 1 tablet by mouth daily at 3 pm.     . carvedilol (COREG) 12.5 MG tablet Take 2 tablets (25 mg total) by mouth 2 (two) times daily with a meal.    . cetirizine (ZYRTEC) 10 MG tablet Take 10 mg by mouth daily.     [name] CINNAMON PO Take 350 mg by mouth daily at 3 pm.     . Coenzyme Q10 200 MG capsule Take 400 mg by mouth daily in the afternoon.     . dofetilide (TIKOSYN) 250 MCG capsule TAKE ONE CAPSULE BY MOUTH TWICE DAILY. (Patient taking differently: Take 250 mcg by mouth 2 (two) times daily. ) 180  capsule 2  . Ferrous Gluconate-C-Folic Acid (IRON-C PO) Take 1 tablet by mouth daily.    . Glucosamine-Chondroitin (GLUCOSAMINE CHONDR COMPLEX PO) Take 1 tablet by mouth daily.     . LUTEIN-ZEAXANTHIN PO Take 2 tablets by mouth daily in the afternoon.     . Multiple Vitamins-Minerals (MULTIVITAMIN PO) Take 1 tablet by mouth daily at 3 pm.     . olmesartan (BENICAR) 40 MG tablet TAKE ONE TABLET BY MOUTH DAILY (Patient taking differently: Take 40 mg by mouth daily. ) 30 tablet 6  . Omega-3 Fatty Acids (OMEGA 3 PO) Take 1,350 mg by mouth daily.    [name] PHOSPHATIDYLCHOLINE PO Take 2 tablets by mouth daily in the  afternoon.     . potassium chloride (KLOR-CON) 10 MEQ tablet Take 2 tablets (20 mEq total) by mouth daily. Take 2 tablets (20 mEq) every morning and 1 tablet  (10 Meq) every evening (Patient taking differently: Take 10 mEq by mouth daily. Taking two tablets in the morning,) 180 tablet 3  . Probiotic Product (PROBIOTIC DAILY PO) Take 2 tablets by mouth daily in the afternoon.     [name] RESVERATROL 100 MG CAPS Take 100 mg by mouth daily at 3 pm.     . tamsulosin (FLOMAX) 0.4 MG CAPS capsule Take 0.4 mg by mouth daily.   5  . XARELTO 20 MG TABS tablet TAKE ONE TABLET BY MOUTH DAILY WITH LUNCH (Patient taking differently: Take 20 mg by mouth daily with supper. ) 30 tablet 11   No current facility-administered medications for this encounter.    Allergies  Allergen Reactions  . Azithromycin     QT WAVE    Social History   Socioeconomic History  . Marital status: Married    Spouse name: Not on file  . Number of children: 3  . Years of education: Not on file  . Highest education level: Not on file  Occupational History  . Not on file  Tobacco Use  . Smoking status: Never Smoker  . Smokeless tobacco: Never Used  Substance and Sexual Activity  . Alcohol use: Never    Alcohol/week: 0.0 standard drinks  . Drug use: Never  . Sexual activity: Not Currently  Other Topics Concern  . Not on file  Social History Narrative  . Not on file   Social Determinants of Health   Financial Resource Strain:   . Difficulty of Paying Living Expenses: Not on file  Food Insecurity:   . Worried About Charity fundraiser in the Last Year: Not on file  . Ran Out of Food in the Last Year: Not on file  Transportation Needs:   . Lack of Transportation (Medical): Not on file  . Lack of Transportation (Non-Medical): Not on file  Physical Activity:   . Days of Exercise per Week: Not on file  . Minutes of Exercise per Session: Not on file  Stress:   . Feeling of Stress : Not on file  Social Connections:   .  Frequency of Communication with Friends and Family: Not on file  . Frequency of Social Gatherings with Friends and Family: Not on file  . Attends Religious Services: Not on file  . Active Member of Clubs or Organizations: Not on file  . Attends Archivist Meetings: Not on file  . Marital Status: Not on file  Intimate Partner Violence:   . Fear of Current or Ex-Partner: Not on file  . Emotionally Abused: Not on file  .  Physically Abused: Not on file  . Sexually Abused: Not on file    Family History  Problem Relation Age of Onset  . Lung cancer Father   . Cancer - Lung Father   . Atrial fibrillation Mother   . Dementia Mother   . Transient ischemic attack Mother   . Hypertension Other   . Liver cancer Brother     ROS- All systems are reviewed and negative except as per the HPI above  Physical Exam: Vitals:   06/30/19 1320  BP: 130/74  Pulse: 90  Weight: 122 kg  Height: 6\' 2"  (1.88 m)   Wt Readings from Last 3 Encounters:  06/30/19 122 kg  04/27/19 122.9 kg  04/18/19 122.4 kg    Labs: Lab Results  Component Value Date   NA 139 04/08/2019   K 3.9 04/08/2019   CL 106 04/08/2019   CO2 23 04/08/2019   GLUCOSE 131 (H) 04/08/2019   BUN 29 (H) 04/08/2019   CREATININE 0.94 04/08/2019   CALCIUM 8.3 (L) 04/08/2019   PHOS 2.3 (L) 04/06/2019   MG 2.1 04/07/2019   Lab Results  Component Value Date   INR 1.08 12/11/2017   No results found for: CHOL, HDL, LDLCALC, TRIG   GEN- The patient is well appearing, alert and oriented x 3 today.   Head- normocephalic, atraumatic Eyes-  Sclera clear, conjunctiva pink Ears- hearing intact Oropharynx- clear Neck- supple, no JVP Lymph- no cervical lymphadenopathy Lungs- Clear to ausculation bilaterally, normal work of breathing Heart- Regular rate and rhythm, no murmurs, rubs or gallops, PMI not laterally displaced GI- soft, NT, ND, + BS Extremities- no clubbing, cyanosis, or edema MS- no significant deformity or  atrophy Skin- no rash or lesion Psych- euthymic mood, full affect Neuro- strength and sensation are intact  EKG-SR with a run of afib vrs disorganized atrial activity, 90 bpm,       Assessment and Plan: 1. afib  Noted irregular heart beat last pm Will increase carvedilol to 25 mg bid to  smooth out rhythm (from 18.75 bid)  Continue tikosyn at 250 meq bid  Bmet/mag/tsh today   2. CHA2DS2VASc score of 4 Continue xarelto 20 mg daily  Will see back early next week Symptoms reviewed that would indicate need for ER visit  over the weekend  [place]. Florean Hoobler, [facility] [address] [place], [place] [address] [phone]

## 2019-07-04 ENCOUNTER — Other Ambulatory Visit: Payer: Self-pay

## 2019-07-04 ENCOUNTER — Encounter (HOSPITAL_COMMUNITY): Payer: Self-pay | Admitting: Nurse Practitioner

## 2019-07-04 ENCOUNTER — Ambulatory Visit (HOSPITAL_COMMUNITY)
Admission: RE | Admit: 2019-07-04 | Discharge: 2019-07-04 | Disposition: A | Discharge status: Home / Self Care | Payer: Medicare Other | Source: Ambulatory Visit | Attending: Nurse Practitioner | Admitting: Nurse Practitioner

## 2019-07-04 VITALS — BP 138/60 | HR 70 | Ht 74.0 in | Wt 270.0 lb

## 2019-07-04 DIAGNOSIS — I4891 Unspecified atrial fibrillation: Secondary | ICD-10-CM | POA: Diagnosis not present

## 2019-07-04 DIAGNOSIS — Z8249 Family history of ischemic heart disease and other diseases of the circulatory system: Secondary | ICD-10-CM | POA: Insufficient documentation

## 2019-07-04 DIAGNOSIS — Z79899 Other long term (current) drug therapy: Secondary | ICD-10-CM | POA: Diagnosis not present

## 2019-07-04 DIAGNOSIS — Z7901 Long term (current) use of anticoagulants: Secondary | ICD-10-CM | POA: Diagnosis not present

## 2019-07-04 DIAGNOSIS — N400 Enlarged prostate without lower urinary tract symptoms: Secondary | ICD-10-CM | POA: Insufficient documentation

## 2019-07-04 DIAGNOSIS — I480 Paroxysmal atrial fibrillation: Secondary | ICD-10-CM | POA: Diagnosis not present

## 2019-07-04 DIAGNOSIS — D6869 Other thrombophilia: Secondary | ICD-10-CM

## 2019-07-04 DIAGNOSIS — I10 Essential (primary) hypertension: Secondary | ICD-10-CM | POA: Diagnosis not present

## 2019-07-04 MED ORDER — CARVEDILOL 25 MG PO TABS: 25.0000 mg | ORAL_TABLET | Freq: Two times a day (BID) | ORAL | 2 refills | Status: DC

## 2019-07-04 NOTE — Progress Notes (Signed)
Primary Care Physician: Glenda Chroman, MD Referring Physician: Dr. Ladora James is a 69 y.o. 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 His ekg today shows SR with a run of afib vrs disorganized atrial activity at 90 bpm. He is on his tikosyn and has not missed any doses. He has had some extra stress but otherwise  nothing out of the usual.  Continues on xarelto 20 mg daily for a CHA2DS2VASc score of 4.   F/u in afib clinic 2/9. On last visit, I increased carvedilol to 25 mg bid and pt states that his heart rhythm is much more regular.since then. He is in [place] today   Today, he denies symptoms of palpitations, chest pain, shortness of breath, orthopnea, PND, lower extremity edema, dizziness, presyncope, syncope, or neurologic sequela. The patient is tolerating medications without difficulties and is otherwise without complaint today.   Past Medical History:  Diagnosis Date  . Arthritis    "knuckles maybe" (02/28/2018)  . Atrial fib/flutter, transient    a. s/p TEE-guided DCCV on 12/14/2017 with return to NSR.   [name] BPH (benign prostatic hyperplasia) 12/11/2017  . Essential hypertension   . Gout    "I take RX qd" (02/28/2018)  . Neuropathy   . Pneumonia 11/2017  . Thyroid nodule    Benign   Past Surgical History:  Procedure Laterality Date  . ATRIAL FIBRILLATION ABLATION N/A 04/04/2019   Procedure: ATRIAL FIBRILLATION ABLATION;  Surgeon: Constance Haw, MD;  Location: [facility];  Service: Cardiovascular;  Laterality: N/A;  . BACK SURGERY    . BIOPSY THYROID    . CARDIOVERSION N/A 12/14/2017   Procedure: CARDIOVERSION;  Surgeon: Satira Sark, MD;  Location: [facility];  Service: Cardiovascular;  Laterality: N/A;  . CARDIOVERSION N/A 01/14/2019   Procedure: CARDIOVERSION;  Surgeon: Constance Haw, MD;  Location: [facility];  Service: Cardiovascular;   Laterality: N/A;  . CATARACT EXTRACTION W/ INTRAOCULAR LENS  IMPLANT, BILATERAL Bilateral 2006-2010   "right-left"  . COLONOSCOPY  2012  . IRRIGATION AND DEBRIDEMENT SEBACEOUS CYST    . LUMBAR LAMINECTOMY N/A 01/20/2013   Procedure: MICRODISCECTOMY LUMBAR LAMINECTOMY;  Surgeon: Marybelle Killings, MD;  Location: [place] [facility];  Service: Orthopedics;  Laterality: N/A;  L4-5 Decompression  . PILONIDAL CYST DRAINAGE    . TEE WITHOUT CARDIOVERSION N/A 12/14/2017   Procedure: TRANSESOPHAGEAL ECHOCARDIOGRAM (TEE) WITH PROPOFOL;  Surgeon: Satira Sark, MD;  Location: [facility];  Service: Cardiovascular;  Laterality: N/A;    Current Outpatient Medications  Medication Sig Dispense Refill  . acetaminophen (TYLENOL) 500 MG tablet Take 1,000 mg by mouth as needed (for pain.).     [name] allopurinol (ZYLOPRIM) 300 MG tablet Take 300 mg by mouth daily.     [name] amLODipine (NORVASC) 5 MG tablet Take 5 mg by mouth daily.    . Calcium Carb-Cholecalciferol (CALCIUM + D3 PO) Take 1 tablet by mouth daily at 3 pm.     . carvedilol (COREG) 25 MG tablet Take 1 tablet (25 mg total) by mouth 2 (two) times daily with a meal. 180 tablet 2  . cetirizine (ZYRTEC) 10 MG tablet Take 10 mg by mouth daily.     [name] CINNAMON PO Take 350 mg by mouth daily at 3 pm.     . Coenzyme Q10 200 MG capsule Take 400 mg by mouth  daily in the afternoon.     . dofetilide (TIKOSYN) 250 MCG capsule TAKE ONE CAPSULE BY MOUTH TWICE DAILY. (Patient taking differently: Take 250 mcg by mouth 2 (two) times daily. ) 180 capsule 2  . Ferrous Gluconate-C-Folic Acid (IRON-C PO) Take 1 tablet by mouth daily.    . Glucosamine-Chondroitin (GLUCOSAMINE CHONDR COMPLEX PO) Take 1 tablet by mouth daily.     . LUTEIN-ZEAXANTHIN PO Take 2 tablets by mouth daily in the afternoon.     . Multiple Vitamins-Minerals (MULTIVITAMIN PO) Take 1 tablet by mouth daily at 3 pm.     . olmesartan (BENICAR) 40 MG tablet TAKE ONE TABLET BY MOUTH DAILY (Patient taking differently: Take 40 mg by  mouth daily. ) 30 tablet 6  . Omega-3 Fatty Acids (OMEGA 3 PO) Take 1,350 mg by mouth daily.    [name] PHOSPHATIDYLCHOLINE PO Take 2 tablets by mouth daily in the afternoon.     . potassium chloride (KLOR-CON) 10 MEQ tablet Taking two tablets in the morning,    . Probiotic Product (PROBIOTIC DAILY PO) Take 2 tablets by mouth daily in the afternoon.     [name] RESVERATROL 100 MG CAPS Take 100 mg by mouth daily at 3 pm.     . tamsulosin (FLOMAX) 0.4 MG CAPS capsule Take 0.4 mg by mouth daily.   5  . XARELTO 20 MG TABS tablet TAKE ONE TABLET BY MOUTH DAILY WITH LUNCH (Patient taking differently: Take 20 mg by mouth daily with supper. ) 30 tablet 11   No current facility-administered medications for this encounter.    Allergies  Allergen Reactions  . Azithromycin     QT WAVE    Social History   Socioeconomic History  . Marital status: Married    Spouse name: Not on file  . Number of children: 3  . Years of education: Not on file  . Highest education level: Not on file  Occupational History  . Not on file  Tobacco Use  . Smoking status: Never Smoker  . Smokeless tobacco: Never Used  Substance and Sexual Activity  . Alcohol use: Never    Alcohol/week: 0.0 standard drinks  . Drug use: Never  . Sexual activity: Not Currently  Other Topics Concern  . Not on file  Social History Narrative  . Not on file   Social Determinants of Health   Financial Resource Strain:   . Difficulty of Paying Living Expenses: Not on file  Food Insecurity:   . Worried About Charity fundraiser in the Last Year: Not on file  . Ran Out of Food in the Last Year: Not on file  Transportation Needs:   . Lack of Transportation (Medical): Not on file  . Lack of Transportation (Non-Medical): Not on file  Physical Activity:   . Days of Exercise per Week: Not on file  . Minutes of Exercise per Session: Not on file  Stress:   . Feeling of Stress : Not on file  Social Connections:   . Frequency of Communication  with Friends and Family: Not on file  . Frequency of Social Gatherings with Friends and Family: Not on file  . Attends Religious Services: Not on file  . Active Member of Clubs or Organizations: Not on file  . Attends Archivist Meetings: Not on file  . Marital Status: Not on file  Intimate Partner Violence:   . Fear of Current or Ex-Partner: Not on file  . Emotionally Abused: Not on file  .  Physically Abused: Not on file  . Sexually Abused: Not on file    Family History  Problem Relation Age of Onset  . Lung cancer Father   . Cancer - Lung Father   . Atrial fibrillation Mother   . Dementia Mother   . Transient ischemic attack Mother   . Hypertension Other   . Liver cancer Brother     ROS- All systems are reviewed and negative except as per the HPI above  Physical Exam: Vitals:   07/04/19 0910  BP: 138/60  Pulse: 70  Weight: 122.5 kg  Height: 6\' 2"  (1.88 m)   Wt Readings from Last 3 Encounters:  07/04/19 122.5 kg  06/30/19 122 kg  04/27/19 122.9 kg    Labs: Lab Results  Component Value Date   NA 141 06/30/2019   K 4.5 06/30/2019   CL 106 06/30/2019   CO2 27 06/30/2019   GLUCOSE 139 (H) 06/30/2019   BUN 18 06/30/2019   CREATININE 1.06 06/30/2019   CALCIUM 8.8 (L) 06/30/2019   PHOS 2.3 (L) 04/06/2019   MG 2.0 06/30/2019   Lab Results  Component Value Date   INR 1.08 12/11/2017   No results found for: CHOL, HDL, LDLCALC, TRIG   GEN- The patient is well appearing, alert and oriented x 3 today.   Head- normocephalic, atraumatic Eyes-  Sclera clear, conjunctiva pink Ears- hearing intact Oropharynx- clear Neck- supple, no JVP Lymph- no cervical lymphadenopathy Lungs- Clear to ausculation bilaterally, normal work of breathing Heart- Regular rate and rhythm, no murmurs, rubs or gallops, PMI not laterally displaced GI- soft, NT, ND, + BS Extremities- no clubbing, cyanosis, or edema MS- no significant deformity or atrophy Skin- no rash or  lesion Psych- euthymic mood, full affect Neuro- strength and sensation are intact  EKG-SR with one PVC. PR int 168 ms, qrs int 92 ms, qtc 427 ms    Assessment and Plan: 1. afib  Noted irregular heart beat last 2/4 [place] rhythm much more regular  since increasing  carvedilol to 25 mg bid  Will keep at that dose Continue tikosyn at 250 meq bid   2. CHA2DS2VASc score of 4 Continue xarelto 20 mg daily  F/u with Dr. Curt Bears as scheduled 2/23  [name]. [name], [facility] [address] [place], [place] [address] [phone]

## 2019-07-05 DIAGNOSIS — Z299 Encounter for prophylactic measures, unspecified: Secondary | ICD-10-CM | POA: Diagnosis not present

## 2019-07-05 DIAGNOSIS — Z6834 Body mass index (BMI) 34.0-34.9, adult: Secondary | ICD-10-CM | POA: Diagnosis not present

## 2019-07-05 DIAGNOSIS — I429 Cardiomyopathy, unspecified: Secondary | ICD-10-CM | POA: Diagnosis not present

## 2019-07-05 DIAGNOSIS — M109 Gout, unspecified: Secondary | ICD-10-CM | POA: Diagnosis not present

## 2019-07-05 DIAGNOSIS — I10 Essential (primary) hypertension: Secondary | ICD-10-CM | POA: Diagnosis not present

## 2019-07-05 DIAGNOSIS — I4891 Unspecified atrial fibrillation: Secondary | ICD-10-CM | POA: Diagnosis not present

## 2019-07-18 ENCOUNTER — Other Ambulatory Visit: Payer: Self-pay

## 2019-07-18 ENCOUNTER — Encounter: Payer: Self-pay | Admitting: Cardiology

## 2019-07-18 ENCOUNTER — Ambulatory Visit (INDEPENDENT_AMBULATORY_CARE_PROVIDER_SITE_OTHER): Payer: Medicare Other | Admitting: Cardiology

## 2019-07-18 VITALS — BP 146/84 | HR 71 | Ht 74.0 in | Wt 272.6 lb

## 2019-07-18 DIAGNOSIS — I480 Paroxysmal atrial fibrillation: Secondary | ICD-10-CM | POA: Diagnosis not present

## 2019-07-18 NOTE — Progress Notes (Signed)
Electrophysiology Office Note   Date:  07/18/2019   ID:  [name], DOB Oct 31, 1950, MRN [name]  PCP:  Glenda Chroman, MD  Cardiologist:  Lovena Le Primary Electrophysiologist:  Yvonnia Tango Meredith Leeds, MD    Chief Complaint: AF   History of Present Illness: [name] is a 69 y.o. male who is being seen today for the evaluation of AF at the request of [name]. Presenting today for electrophysiology evaluation.  He has a history of atrial fibrillation/atrial flutter, status post recent cardioversion, hypertension.  He has been on Tikosyn for over a year.  He is unfortunately had more atrial fibrillation and was suggested to try either amiodarone or ablation.  He has been having slow pounding at night that has been worrisome.  He also has noted short spells of regular rhythm with pauses.  He is now status post atrial fibrillation ablation on 04/04/2019.  Despite pacing around the anterior wall of the right pulmonary veins, he developed phrenic nerve paralysis.  He continues to be mildly short of breath but this has been improving over the last few months.  Today, denies symptoms of palpitations, chest pain, shortness of breath, orthopnea, PND, lower extremity edema, claudication, dizziness, presyncope, syncope, bleeding, or neurologic sequela. The patient is tolerating medications without difficulties.     Past Medical History:  Diagnosis Date  . Arthritis    "knuckles maybe" (02/28/2018)  . Atrial fib/flutter, transient    a. s/p TEE-guided DCCV on 12/14/2017 with return to NSR.   [name] BPH (benign prostatic hyperplasia) 12/11/2017  . Essential hypertension   . Gout    "I take RX qd" (02/28/2018)  . Neuropathy   . Pneumonia 11/2017  . Thyroid nodule    Benign   Past Surgical History:  Procedure Laterality Date  . ATRIAL FIBRILLATION ABLATION N/A 04/04/2019   Procedure: ATRIAL FIBRILLATION ABLATION;  Surgeon: Constance Haw, MD;  Location: [facility];   Service: Cardiovascular;  Laterality: N/A;  . BACK SURGERY    . BIOPSY THYROID    . CARDIOVERSION N/A 12/14/2017   Procedure: CARDIOVERSION;  Surgeon: Satira Sark, MD;  Location: [facility];  Service: Cardiovascular;  Laterality: N/A;  . CARDIOVERSION N/A 01/14/2019   Procedure: CARDIOVERSION;  Surgeon: Constance Haw, MD;  Location: [facility];  Service: Cardiovascular;  Laterality: N/A;  . CATARACT EXTRACTION W/ INTRAOCULAR LENS  IMPLANT, BILATERAL Bilateral 2006-2010   "right-left"  . COLONOSCOPY  2012  . IRRIGATION AND DEBRIDEMENT SEBACEOUS CYST    . LUMBAR LAMINECTOMY N/A 01/20/2013   Procedure: MICRODISCECTOMY LUMBAR LAMINECTOMY;  Surgeon: Marybelle Killings, MD;  Location: [facility];  Service: Orthopedics;  Laterality: N/A;  L4-5 Decompression  . PILONIDAL CYST DRAINAGE    . TEE WITHOUT CARDIOVERSION N/A 12/14/2017   Procedure: TRANSESOPHAGEAL ECHOCARDIOGRAM (TEE) WITH PROPOFOL;  Surgeon: Satira Sark, MD;  Location: [facility];  Service: Cardiovascular;  Laterality: N/A;     Current Outpatient Medications  Medication Sig Dispense Refill  . acetaminophen (TYLENOL) 500 MG tablet Take 1,000 mg by mouth as needed (for pain.).     [name] allopurinol (ZYLOPRIM) 300 MG tablet Take 300 mg by mouth daily.     [name] amLODipine (NORVASC) 5 MG tablet Take 5 mg by mouth daily.    . Calcium Carb-Cholecalciferol (CALCIUM + D3 PO) Take 1 tablet by mouth daily at 3 pm.     . carvedilol (COREG) 25 MG tablet Take 1 tablet (25 mg total) by mouth 2 (two)  times daily with a meal. 180 tablet 2  . cetirizine (ZYRTEC) 10 MG tablet Take 10 mg by mouth daily.     [name] CINNAMON PO Take 350 mg by mouth daily at 3 pm.     . Coenzyme Q10 200 MG capsule Take 400 mg by mouth daily in the afternoon.     . dofetilide (TIKOSYN) 250 MCG capsule Take 250 mcg by mouth 2 (two) times daily.    . Ferrous Gluconate-C-Folic Acid (IRON-C PO) Take 1 tablet by mouth daily.    . Glucosamine-Chondroitin (GLUCOSAMINE CHONDR COMPLEX PO)  Take 1 tablet by mouth daily.     . LUTEIN-ZEAXANTHIN PO Take 2 tablets by mouth daily in the afternoon.     . Multiple Vitamins-Minerals (MULTIVITAMIN PO) Take 1 tablet by mouth daily at 3 pm.     . olmesartan (BENICAR) 40 MG tablet Take 40 mg by mouth daily.    . Omega-3 Fatty Acids (OMEGA 3 PO) Take 1,350 mg by mouth daily.    [name] PHOSPHATIDYLCHOLINE PO Take 2 tablets by mouth daily in the afternoon.     . potassium chloride (KLOR-CON) 10 MEQ tablet Taking two tablets in the morning,    . Probiotic Product (PROBIOTIC DAILY PO) Take 2 tablets by mouth daily in the afternoon.     [name] RESVERATROL 100 MG CAPS Take 100 mg by mouth daily at 3 pm.     . rivaroxaban (XARELTO) 20 MG TABS tablet Take 20 mg by mouth daily with supper.    . tamsulosin (FLOMAX) 0.4 MG CAPS capsule Take 0.4 mg by mouth daily.   5   No current facility-administered medications for this visit.    Allergies:   Azithromycin   Social History:  The patient  reports that he has never smoked. He has never used smokeless tobacco. He reports that he does not drink alcohol or use drugs.   Family History:  The patient's family history includes Atrial fibrillation in his mother; Cancer - Lung in his father; Dementia in his mother; Hypertension in an other family member; Liver cancer in his brother; Lung cancer in his father; Transient ischemic attack in his mother.    ROS:  Please see the history of present illness.   Otherwise, review of systems is positive for none.   All other systems are reviewed and negative.   PHYSICAL EXAM: VS:  BP (!) 146/84   Pulse 71   Ht 6\' 2"  (1.88 m)   Wt 272 lb 9.6 oz (123.7 kg)   SpO2 97%   BMI 35.00 kg/m  , BMI Body mass index is 35 kg/m. GEN: Well nourished, well developed, in no acute distress  HEENT: normal  Neck: no JVD, carotid bruits, or masses Cardiac: RRR; no murmurs, rubs, or gallops,no edema  Respiratory:  clear to auscultation bilaterally, normal work of breathing GI: soft,  nontender, nondistended, + BS MS: no deformity or atrophy  Skin: warm and dry Neuro:  Strength and sensation are intact Psych: euthymic mood, full affect  EKG:  EKG is ordered today. Personal review of the ekg ordered shows sinus rhythm  Recent Labs: 04/06/2019: B Natriuretic Peptide 61.5 04/08/2019: ALT 23; Hemoglobin 11.9; Platelets 122 06/30/2019: BUN 18; Creatinine, Ser 1.06; Magnesium 2.0; Potassium 4.5; Sodium 141; TSH 2.003    Lipid Panel  No results found for: CHOL, TRIG, HDL, CHOLHDL, VLDL, LDLCALC, LDLDIRECT   Wt Readings from Last 3 Encounters:  07/18/19 272 lb 9.6 oz (123.7 kg)  07/04/19 270 lb (  122.5 kg)  06/30/19 269 lb (122 kg)      Other studies Reviewed: Additional studies/ records that were reviewed today include: TTE 12/13/18  Review of the above records today demonstrates:  - Left ventricle: EF difficult to estimate due to presence of   atrial fibrillation and variable heart rate. Appears mildly   depressed. The cavity size was normal. Wall thickness was   increased in a pattern of mild LVH. Systolic function was mildly   reduced. The estimated ejection fraction was in the range of 45%   to 50%. Wall motion was normal; there were no regional wall   motion abnormalities. - Left atrium: The atrium was moderately dilated. - Pulmonary arteries: Systolic pressure was mildly increased. PA   peak pressure: 32 mm Hg (S).   ASSESSMENT AND PLAN:  1.  Persistent atrial fibrillation/flutter: Currently on dofetilide and Xarelto.  Is now status post AF ablation 04/04/2019.  CHA2DS2-VASc of 2.  He remains in sinus rhythm.  Unfortunately he did have phrenic nerve palsy despite pacing anterior to the right sided pulmonary veins.  This has been improving.  No changes.  2.  Hypertension: Mildly elevated today.  This Haillee Johann be addressed by his primary physician.   Current medicines are reviewed at length with the patient today.   The patient does not have concerns  regarding his medicines.  The following changes were made today: None  Labs/ tests ordered today include:  Orders Placed This Encounter  Procedures  . EKG 12-Lead     Disposition:   FU with Regina Ganci 6 months  Signed, Bradely Rudin Meredith Leeds, MD  07/18/2019 11:49 AM     [facility] [address] [place] [address] [phone] (office) [phone] (fax)

## 2019-07-18 NOTE — Patient Instructions (Signed)
Medication Instructions:  Your physician recommends that you continue on your current medications as directed. Please refer to the Current Medication list given to you today.  *If you need a refill on your cardiac medications before your next appointment, please call your pharmacy*   Lab Work: None ordered If you have labs (blood work) drawn today and your tests are completely normal, you will receive your results only by: . MyChart Message (if you have MyChart) OR . A paper copy in the mail If you have any lab test that is abnormal or we need to change your treatment, we will call you to review the results.   Testing/Procedures: None ordered   Follow-Up: At [facility], you and your health needs are our priority.  As part of our continuing mission to provide you with exceptional heart care, we have created designated Provider Care Teams.  These Care Teams include your primary Cardiologist (physician) and Advanced Practice Providers (APPs -  Physician Assistants and Nurse Practitioners) who all work together to provide you with the care you need, when you need it.  Your next appointment:   6 month(s)  The format for your next appointment:   In Person  Provider:   Will Camnitz, MD    Thank you for choosing [facility]!!   Laurens Matheny, RN [phone]    

## 2019-07-19 DIAGNOSIS — I10 Essential (primary) hypertension: Secondary | ICD-10-CM | POA: Diagnosis not present

## 2019-07-31 ENCOUNTER — Other Ambulatory Visit: Payer: Self-pay | Admitting: Cardiovascular Disease

## 2019-08-01 NOTE — Telephone Encounter (Signed)
Pt at [facility] office thank you.

## 2019-09-15 DIAGNOSIS — I10 Essential (primary) hypertension: Secondary | ICD-10-CM | POA: Diagnosis not present

## 2019-09-26 ENCOUNTER — Other Ambulatory Visit: Payer: Self-pay

## 2019-09-26 ENCOUNTER — Ambulatory Visit (HOSPITAL_COMMUNITY)
Admission: RE | Admit: 2019-09-26 | Discharge: 2019-09-26 | Disposition: A | Discharge status: Home / Self Care | Payer: Medicare Other | Source: Ambulatory Visit | Attending: Internal Medicine | Admitting: Internal Medicine

## 2019-09-26 ENCOUNTER — Other Ambulatory Visit: Payer: Self-pay | Admitting: *Deleted

## 2019-09-26 DIAGNOSIS — J986 Disorders of diaphragm: Secondary | ICD-10-CM

## 2019-09-26 MED ORDER — AMLODIPINE BESYLATE 5 MG PO TABS: 5.0000 mg | ORAL_TABLET | Freq: Every day | ORAL | 1 refills | Status: DC

## 2019-09-29 DIAGNOSIS — H43813 Vitreous degeneration, bilateral: Secondary | ICD-10-CM | POA: Diagnosis not present

## 2019-10-03 DIAGNOSIS — H903 Sensorineural hearing loss, bilateral: Secondary | ICD-10-CM | POA: Insufficient documentation

## 2019-10-03 DIAGNOSIS — H9313 Tinnitus, bilateral: Secondary | ICD-10-CM | POA: Diagnosis not present

## 2019-10-03 DIAGNOSIS — H9042 Sensorineural hearing loss, unilateral, left ear, with unrestricted hearing on the contralateral side: Secondary | ICD-10-CM | POA: Diagnosis not present

## 2019-10-11 ENCOUNTER — Encounter: Payer: Self-pay | Admitting: Internal Medicine

## 2019-10-11 ENCOUNTER — Other Ambulatory Visit: Payer: Self-pay

## 2019-10-11 ENCOUNTER — Ambulatory Visit (INDEPENDENT_AMBULATORY_CARE_PROVIDER_SITE_OTHER): Payer: Medicare Other | Admitting: Internal Medicine

## 2019-10-11 VITALS — BP 130/64 | HR 64 | Temp 98.2°F | Ht 74.0 in | Wt 268.0 lb

## 2019-10-11 DIAGNOSIS — J986 Disorders of diaphragm: Secondary | ICD-10-CM | POA: Diagnosis not present

## 2019-10-11 NOTE — Progress Notes (Signed)
Pulmonary Office Note  S: Here for f/u of SOB found after atrial ablation diagnosed with R phrenic nerve palsy. Treated with usual conservative management x 6 months. No change in MMRC 1 dyspnea. No interim hospitalizations.    O: Today's Vitals   10/11/19 0936 10/11/19 0938  BP:  130/64  Pulse:  64  Temp: 98.2 F (36.8 C) 98.2 F (36.8 C)  TempSrc:  Temporal  SpO2:  97%  Weight:  268 lb (121.6 kg)  Height:  6\' 2"  (1.88 m)   Body mass index is 34.41 kg/m.  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 - He would like more time to review on his own and will let us know if he would like a TCTS referral - He was encouraged to continue to try to exercise  MDM Total of 35 minutes spent on this patient encounter of which > 50% was face to face

## 2019-10-11 NOTE — Patient Instructions (Signed)
-   Keep up activity as able  - If you would like to discuss any potential fix with a surgeon, call our office and we can make you an appointment:  Surgeon: Dr. Kipp Brood Diaphragmatic plication

## 2019-10-22 DIAGNOSIS — I4891 Unspecified atrial fibrillation: Secondary | ICD-10-CM | POA: Diagnosis not present

## 2019-10-22 DIAGNOSIS — I10 Essential (primary) hypertension: Secondary | ICD-10-CM | POA: Diagnosis not present

## 2019-10-24 DIAGNOSIS — D6869 Other thrombophilia: Secondary | ICD-10-CM | POA: Diagnosis not present

## 2019-10-24 DIAGNOSIS — Z299 Encounter for prophylactic measures, unspecified: Secondary | ICD-10-CM | POA: Diagnosis not present

## 2019-10-24 DIAGNOSIS — I429 Cardiomyopathy, unspecified: Secondary | ICD-10-CM | POA: Diagnosis not present

## 2019-10-24 DIAGNOSIS — I10 Essential (primary) hypertension: Secondary | ICD-10-CM | POA: Diagnosis not present

## 2019-10-24 DIAGNOSIS — Z6833 Body mass index (BMI) 33.0-33.9, adult: Secondary | ICD-10-CM | POA: Diagnosis not present

## 2019-10-24 DIAGNOSIS — I4891 Unspecified atrial fibrillation: Secondary | ICD-10-CM | POA: Diagnosis not present

## 2019-10-24 DIAGNOSIS — D649 Anemia, unspecified: Secondary | ICD-10-CM | POA: Diagnosis not present

## 2019-10-24 DIAGNOSIS — E7800 Pure hypercholesterolemia, unspecified: Secondary | ICD-10-CM | POA: Diagnosis not present

## 2019-10-27 DIAGNOSIS — H43813 Vitreous degeneration, bilateral: Secondary | ICD-10-CM | POA: Diagnosis not present

## 2019-11-22 DIAGNOSIS — I4891 Unspecified atrial fibrillation: Secondary | ICD-10-CM | POA: Diagnosis not present

## 2019-11-22 DIAGNOSIS — I10 Essential (primary) hypertension: Secondary | ICD-10-CM | POA: Diagnosis not present

## 2019-12-22 DIAGNOSIS — I4891 Unspecified atrial fibrillation: Secondary | ICD-10-CM | POA: Diagnosis not present

## 2019-12-22 DIAGNOSIS — H43813 Vitreous degeneration, bilateral: Secondary | ICD-10-CM | POA: Diagnosis not present

## 2019-12-22 DIAGNOSIS — I10 Essential (primary) hypertension: Secondary | ICD-10-CM | POA: Diagnosis not present

## 2019-12-25 ENCOUNTER — Other Ambulatory Visit: Payer: Self-pay | Admitting: *Deleted

## 2019-12-25 MED ORDER — RIVAROXABAN 20 MG PO TABS: 20.0000 mg | ORAL_TABLET | Freq: Every day | ORAL | 3 refills | Status: DC

## 2019-12-25 NOTE — Telephone Encounter (Signed)
Xarelto Rx sent to pharmacy

## 2020-01-10 ENCOUNTER — Other Ambulatory Visit: Payer: Self-pay | Admitting: Cardiology

## 2020-01-10 DIAGNOSIS — I10 Essential (primary) hypertension: Secondary | ICD-10-CM | POA: Diagnosis not present

## 2020-01-10 DIAGNOSIS — I4891 Unspecified atrial fibrillation: Secondary | ICD-10-CM | POA: Diagnosis not present

## 2020-01-24 ENCOUNTER — Other Ambulatory Visit: Payer: Self-pay | Admitting: Cardiology

## 2020-02-13 DIAGNOSIS — I429 Cardiomyopathy, unspecified: Secondary | ICD-10-CM | POA: Diagnosis not present

## 2020-02-13 DIAGNOSIS — F419 Anxiety disorder, unspecified: Secondary | ICD-10-CM | POA: Diagnosis not present

## 2020-02-13 DIAGNOSIS — I4891 Unspecified atrial fibrillation: Secondary | ICD-10-CM | POA: Diagnosis not present

## 2020-02-13 DIAGNOSIS — I10 Essential (primary) hypertension: Secondary | ICD-10-CM | POA: Diagnosis not present

## 2020-02-13 DIAGNOSIS — Z299 Encounter for prophylactic measures, unspecified: Secondary | ICD-10-CM | POA: Diagnosis not present

## 2020-02-22 DIAGNOSIS — I10 Essential (primary) hypertension: Secondary | ICD-10-CM | POA: Diagnosis not present

## 2020-02-22 DIAGNOSIS — I4891 Unspecified atrial fibrillation: Secondary | ICD-10-CM | POA: Diagnosis not present

## 2020-03-01 ENCOUNTER — Other Ambulatory Visit: Payer: Self-pay | Admitting: *Deleted

## 2020-03-01 ENCOUNTER — Other Ambulatory Visit (HOSPITAL_COMMUNITY): Payer: Self-pay | Admitting: Nurse Practitioner

## 2020-03-01 MED ORDER — OLMESARTAN MEDOXOMIL 40 MG PO TABS: 40.0000 mg | ORAL_TABLET | Freq: Every day | ORAL | 0 refills | Status: DC

## 2020-03-18 DIAGNOSIS — Z23 Encounter for immunization: Secondary | ICD-10-CM | POA: Diagnosis not present

## 2020-03-19 DIAGNOSIS — I10 Essential (primary) hypertension: Secondary | ICD-10-CM | POA: Diagnosis not present

## 2020-03-19 DIAGNOSIS — Z299 Encounter for prophylactic measures, unspecified: Secondary | ICD-10-CM | POA: Diagnosis not present

## 2020-03-19 DIAGNOSIS — R109 Unspecified abdominal pain: Secondary | ICD-10-CM | POA: Diagnosis not present

## 2020-03-22 DIAGNOSIS — I4891 Unspecified atrial fibrillation: Secondary | ICD-10-CM | POA: Diagnosis not present

## 2020-03-22 DIAGNOSIS — I10 Essential (primary) hypertension: Secondary | ICD-10-CM | POA: Diagnosis not present

## 2020-03-26 DIAGNOSIS — M79672 Pain in left foot: Secondary | ICD-10-CM | POA: Diagnosis not present

## 2020-03-26 DIAGNOSIS — M79671 Pain in right foot: Secondary | ICD-10-CM | POA: Diagnosis not present

## 2020-03-26 DIAGNOSIS — M7741 Metatarsalgia, right foot: Secondary | ICD-10-CM | POA: Diagnosis not present

## 2020-03-26 DIAGNOSIS — M7742 Metatarsalgia, left foot: Secondary | ICD-10-CM | POA: Diagnosis not present

## 2020-03-28 DIAGNOSIS — Z299 Encounter for prophylactic measures, unspecified: Secondary | ICD-10-CM | POA: Diagnosis not present

## 2020-03-28 DIAGNOSIS — M109 Gout, unspecified: Secondary | ICD-10-CM | POA: Diagnosis not present

## 2020-03-28 DIAGNOSIS — I429 Cardiomyopathy, unspecified: Secondary | ICD-10-CM | POA: Diagnosis not present

## 2020-03-28 DIAGNOSIS — G5712 Meralgia paresthetica, left lower limb: Secondary | ICD-10-CM | POA: Diagnosis not present

## 2020-03-28 DIAGNOSIS — G629 Polyneuropathy, unspecified: Secondary | ICD-10-CM | POA: Diagnosis not present

## 2020-03-28 DIAGNOSIS — Z2821 Immunization not carried out because of patient refusal: Secondary | ICD-10-CM | POA: Diagnosis not present

## 2020-04-16 DIAGNOSIS — Z23 Encounter for immunization: Secondary | ICD-10-CM | POA: Diagnosis not present

## 2020-04-23 DIAGNOSIS — I10 Essential (primary) hypertension: Secondary | ICD-10-CM | POA: Diagnosis not present

## 2020-04-23 DIAGNOSIS — I4891 Unspecified atrial fibrillation: Secondary | ICD-10-CM | POA: Diagnosis not present

## 2020-04-26 DIAGNOSIS — H43813 Vitreous degeneration, bilateral: Secondary | ICD-10-CM | POA: Diagnosis not present

## 2020-05-01 ENCOUNTER — Other Ambulatory Visit: Payer: Self-pay | Admitting: *Deleted

## 2020-05-01 ENCOUNTER — Other Ambulatory Visit: Payer: Self-pay

## 2020-05-01 MED ORDER — POTASSIUM CHLORIDE CRYS ER 10 MEQ PO TBCR: EXTENDED_RELEASE_TABLET | ORAL | Status: DC

## 2020-05-01 MED ORDER — OLMESARTAN MEDOXOMIL 40 MG PO TABS: 40.0000 mg | ORAL_TABLET | Freq: Every day | ORAL | 0 refills | Status: AC

## 2020-05-01 MED ORDER — CARVEDILOL 25 MG PO TABS: ORAL_TABLET | ORAL | 0 refills | Status: AC

## 2020-05-02 DIAGNOSIS — Z299 Encounter for prophylactic measures, unspecified: Secondary | ICD-10-CM | POA: Diagnosis not present

## 2020-05-02 DIAGNOSIS — Z6833 Body mass index (BMI) 33.0-33.9, adult: Secondary | ICD-10-CM | POA: Diagnosis not present

## 2020-05-02 DIAGNOSIS — Z1331 Encounter for screening for depression: Secondary | ICD-10-CM | POA: Diagnosis not present

## 2020-05-02 DIAGNOSIS — E7800 Pure hypercholesterolemia, unspecified: Secondary | ICD-10-CM | POA: Diagnosis not present

## 2020-05-02 DIAGNOSIS — Z0000 Encounter for general adult medical examination without abnormal findings: Secondary | ICD-10-CM | POA: Diagnosis not present

## 2020-05-02 DIAGNOSIS — I10 Essential (primary) hypertension: Secondary | ICD-10-CM | POA: Diagnosis not present

## 2020-05-02 DIAGNOSIS — Z1339 Encounter for screening examination for other mental health and behavioral disorders: Secondary | ICD-10-CM | POA: Diagnosis not present

## 2020-05-02 DIAGNOSIS — Z79899 Other long term (current) drug therapy: Secondary | ICD-10-CM | POA: Diagnosis not present

## 2020-05-02 DIAGNOSIS — Z7189 Other specified counseling: Secondary | ICD-10-CM | POA: Diagnosis not present

## 2020-05-02 DIAGNOSIS — Z125 Encounter for screening for malignant neoplasm of prostate: Secondary | ICD-10-CM | POA: Diagnosis not present

## 2020-05-02 DIAGNOSIS — R5383 Other fatigue: Secondary | ICD-10-CM | POA: Diagnosis not present

## 2020-05-08 ENCOUNTER — Encounter: Payer: Self-pay | Admitting: Internal Medicine

## 2020-05-23 DIAGNOSIS — I10 Essential (primary) hypertension: Secondary | ICD-10-CM | POA: Diagnosis not present

## 2020-05-23 DIAGNOSIS — I4891 Unspecified atrial fibrillation: Secondary | ICD-10-CM | POA: Diagnosis not present

## 2020-05-28 ENCOUNTER — Telehealth: Payer: Self-pay | Admitting: Cardiology

## 2020-05-28 MED ORDER — RIVAROXABAN 20 MG PO TABS: 20.0000 mg | ORAL_TABLET | Freq: Every day | ORAL | 1 refills | Status: DC

## 2020-05-28 NOTE — Telephone Encounter (Signed)
Prescription refill request for Xarelto received.  Indication:afib Last office visit:07/18/19 Weight:121.6 Age:70 Scr:1.06 CrCl:109 ml/min

## 2020-05-28 NOTE — Telephone Encounter (Signed)
*  STAT* If patient is at the pharmacy, call can be transferred to refill team.   1. Which medications need to be refilled? (please list name of each medication and dose if known)  rivaroxaban (XARELTO) 20 MG TABS tablet  2. Which pharmacy/location (including street and city if local pharmacy) is medication to be sent to? [facility] - [place], [place] - [address]  3. Do they need a 30 day or 90 day supply? 90

## 2020-06-11 ENCOUNTER — Ambulatory Visit: Payer: Medicare Other | Admitting: Neurology

## 2020-06-20 ENCOUNTER — Ambulatory Visit: Payer: Medicare Other | Admitting: Internal Medicine

## 2020-06-22 ENCOUNTER — Encounter: Payer: Self-pay | Admitting: Gastroenterology

## 2020-06-22 NOTE — Progress Notes (Addendum)
Referring Provider:Vyas, Costella Hatcher, MD Primary Care Physician:  Glenda Chroman, MD Primary Gastroenterologist:  Dr. Gala Romney  Chief Complaint  Patient presents with   Colonoscopy    Last done 10 years ago, mother: hx polyps, maternal uncle: colon cancer age 72's    HPI:   [name] is a 70 y.o. male presenting today at the request of Vyas, Dhruv B, MD for consult colonoscopy, office visit due to medications including Xarelto with history of atrial fibrillation. Underwent atrial fibrillation ablation in November 2020 which resulted in right phrenic nerve palsy with shortness of breath.  There has been some discussion about diaphragmatic plication with pulmonology, but patient has requested to think further about this. Also with history of HTN and CHF with EF 45-50% in November 2020 with global hypokinesis and mild LVH.   Today: Last colonoscopy 10 years at [facility]. No polyps. Dr. Anthony Sar. No GI problems. No abdominal pain. BMs daily. No black stools or brbpr. No unintentional weight loss. Mother with history of colon polyps. Maternal uncle had colon cancer in his 75s.   Denies nausea, vomiting, GERD, or dysphagia.  Occasional "quiver" in his heart occasionally. No CP. Phrenic nerve palsy after ablation.  Has to sleep on his right side as he breathes better this way.  If he lays flat on his back, he has to focus on breathing. No SOB with daily activities. SOB with exertion or bending over for any period of time.    Past Medical History:  Diagnosis Date   Arthritis    "knuckles maybe" (02/28/2018)   Atrial fib/flutter, transient    a. s/p TEE-guided DCCV on 12/14/2017 with return to NSR.    BPH (benign prostatic hyperplasia) 12/11/2017   CHF (congestive heart failure) ([place])    Echo in November 2020 with EF 45-50% mild LVH, global hypokinesis.   Essential hypertension    Gout    "I take RX qd" (02/28/2018)   Neuropathy    Phrenic nerve palsy    Following atrial ablation in  November 2020.   Pneumonia 11/2017   Thyroid nodule    Benign    Past Surgical History:  Procedure Laterality Date   ATRIAL FIBRILLATION ABLATION N/A 04/04/2019   Procedure: ATRIAL FIBRILLATION ABLATION;  Surgeon: Constance Haw, MD;  Location: [facility];  Service: Cardiovascular;  Laterality: N/A;   BACK SURGERY     BIOPSY THYROID     CARDIOVERSION N/A 12/14/2017   Procedure: CARDIOVERSION;  Surgeon: Satira Sark, MD;  Location: [facility];  Service: Cardiovascular;  Laterality: N/A;   CARDIOVERSION N/A 01/14/2019   Procedure: CARDIOVERSION;  Surgeon: Constance Haw, MD;  Location: [facility];  Service: Cardiovascular;  Laterality: N/A;   CATARACT EXTRACTION W/ INTRAOCULAR LENS  IMPLANT, BILATERAL Bilateral 2006-2010   "right-left"   COLONOSCOPY  2012   [place]   IRRIGATION AND DEBRIDEMENT SEBACEOUS CYST     LUMBAR LAMINECTOMY N/A 01/20/2013   Procedure: MICRODISCECTOMY LUMBAR LAMINECTOMY;  Surgeon: Marybelle Killings, MD;  Location: [facility];  Service: Orthopedics;  Laterality: N/A;  L4-5 Decompression   PILONIDAL CYST DRAINAGE     TEE WITHOUT CARDIOVERSION N/A 12/14/2017   Procedure: TRANSESOPHAGEAL ECHOCARDIOGRAM (TEE) WITH PROPOFOL;  Surgeon: Satira Sark, MD;  Location: [facility];  Service: Cardiovascular;  Laterality: N/A;    Current Outpatient Medications  Medication Sig Dispense Refill   acetaminophen (TYLENOL) 500 MG tablet Take 1,000 mg by mouth as needed (for pain.).  allopurinol (ZYLOPRIM) 300 MG tablet Take 300 mg by mouth daily.      amLODipine (NORVASC) 5 MG tablet TAKE ONE TABLET BY MOUTH DAILY 30 tablet 0   buPROPion (WELLBUTRIN XL) 150 MG 24 hr tablet Take 150 mg by mouth daily.     Calcium Carb-Cholecalciferol (CALCIUM + D3 PO) Take 1 tablet by mouth daily at 3 pm.      carvedilol (COREG) 25 MG tablet TAKE ONE TABLET BY MOUTH TWICE DAILY. TAKE WITH A MEAL. Please make yearly appt with Dr. Curt Bears for February 2022 for  future refills. Thank you 1st attempt 90 tablet 0   cetirizine (ZYRTEC) 10 MG tablet Take 10 mg by mouth daily. As needed     CINNAMON PO Take 350 mg by mouth daily at 3 pm.      Coenzyme Q10 200 MG capsule Take 400 mg by mouth daily in the afternoon.      dofetilide (TIKOSYN) 250 MCG capsule TAKE ONE CAPSULE BY MOUTH TWICE DAILY 180 capsule 2   Ferrous Gluconate-C-Folic Acid (IRON-C PO) Take 1 tablet by mouth daily.     Glucosamine-Chondroitin (GLUCOSAMINE CHONDR COMPLEX PO) Take 1 tablet by mouth daily.      LUTEIN-ZEAXANTHIN PO Take 2 tablets by mouth daily in the afternoon.      Multiple Vitamins-Minerals (MULTIVITAMIN PO) Take 1 tablet by mouth daily at 3 pm.      olmesartan (BENICAR) 40 MG tablet Take 1 tablet (40 mg total) by mouth daily. 15 tablet 0   Omega-3 Fatty Acids (OMEGA 3 PO) Take 1,350 mg by mouth daily.     PHOSPHATIDYLCHOLINE PO Take 2 tablets by mouth daily in the afternoon.      potassium chloride (KLOR-CON) 10 MEQ tablet Taking two tablets in the morning,     Probiotic Product (PROBIOTIC DAILY PO) Take 2 tablets by mouth daily in the afternoon.      RESVERATROL 100 MG CAPS Take 100 mg by mouth daily at 3 pm.      rivaroxaban (XARELTO) 20 MG TABS tablet Take 1 tablet (20 mg total) by mouth daily with supper. 90 tablet 1   tamsulosin (FLOMAX) 0.4 MG CAPS capsule Take 0.4 mg by mouth daily.   5   No current facility-administered medications for this visit.    Allergies as of 06/24/2020 - Review Complete 06/24/2020  Allergen Reaction Noted   Azithromycin  06/29/2018    Family History  Problem Relation Age of Onset   Lung cancer Father    Cancer - Lung Father    Atrial fibrillation Mother    Dementia Mother    Transient ischemic attack Mother    Colon polyps Mother    Hypertension Other    Liver cancer Brother    Colon cancer Maternal Uncle        diagnosed in his late 28s or early 97s    Social History   Socioeconomic History    Marital status: Married    Spouse name: Not on file   Number of children: 3   Years of education: Not on file   Highest education level: Not on file  Occupational History   Not on file  Tobacco Use   Smoking status: Never Smoker   Smokeless tobacco: Never Used  Vaping Use   Vaping Use: Never used  Substance and Sexual Activity   Alcohol use: Never    Alcohol/week: 0.0 standard drinks   Drug use: Never   Sexual activity: Not Currently  Other  Topics Concern   Not on file  Social History Narrative   Not on file   Social Determinants of Health   Financial Resource Strain: Not on file  Food Insecurity: Not on file  Transportation Needs: Not on file  Physical Activity: Not on file  Stress: Not on file  Social Connections: Not on file  Intimate Partner Violence: Not on file    Review of Systems: Gen: Denies any fever, chills, cold or flulike symptoms, lightheadedness, dizziness, presyncope, syncope. CV: See HPI Resp: See HPI GI: See HPI GU : Denies urinary burning, urinary frequency, urinary hesitancy MS: Denies joint pain.  Derm: Denies rash Psych: Slight depression.  Managed with Wellbutrin. Heme: See HPI  Physical Exam: BP 126/69    Pulse 66    Temp (!) 97.3 F (36.3 C)    Ht $R'6\' 2"'ej$  (1.88 m)    Wt 265 lb (120.2 kg)    BMI 34.02 kg/m  General:   Alert and oriented. Pleasant and cooperative. Well-nourished and well-developed.  Head:  Normocephalic and atraumatic. Eyes:  Without icterus, sclera clear and conjunctiva pink.  Ears:  Normal auditory acuity. Lungs:  Clear to auscultation bilaterally. No wheezes, rales, or rhonchi. No distress.  Heart:  S1, S2 present without murmurs appreciated. Regular rate and rhythm.  Abdomen:  +BS, soft, non-tender and non-distended. No HSM noted. No guarding or rebound. No masses appreciated.  Rectal:  Deferred  Msk:  Symmetrical without gross deformities. Normal posture. Extremities:  With 1+ bilateral lower extremity  pitting edema.  Neurologic:  Alert and  oriented x4;  grossly normal neurologically. Skin:  Intact without significant lesions or rashes. Psych: Normal mood and affect.  Labs: 05/02/2020: CBC: WBC 6.7, hemoglobin 13.6, hematocrit 40.4, MCV 87, MCH 29.2, MCHC 33.7, platelets 144 CMP: Glucose 108 (H), creatinine 1.04, sodium 140, potassium 4.1, chloride 105, CO2 24, calcium 8.9, protein 7.0, albumin 4.1, total bilirubin 1.2, alk phos 71, AST 21, ALT 22 Lipid panel: Total cholesterol 145, triglycerides 96, HDL 47, LDL 80 TSH: 3.020 PSA: 0.2

## 2020-06-24 ENCOUNTER — Telehealth: Payer: Self-pay

## 2020-06-24 ENCOUNTER — Ambulatory Visit (INDEPENDENT_AMBULATORY_CARE_PROVIDER_SITE_OTHER): Payer: Medicare Other | Admitting: Gastroenterology

## 2020-06-24 ENCOUNTER — Other Ambulatory Visit: Payer: Self-pay

## 2020-06-24 ENCOUNTER — Encounter: Payer: Self-pay | Admitting: Gastroenterology

## 2020-06-24 VITALS — BP 126/69 | HR 66 | Temp 97.3°F | Ht 74.0 in | Wt 265.0 lb

## 2020-06-24 DIAGNOSIS — Z1211 Encounter for screening for malignant neoplasm of colon: Secondary | ICD-10-CM | POA: Insufficient documentation

## 2020-06-24 DIAGNOSIS — Z01818 Encounter for other preprocedural examination: Secondary | ICD-10-CM

## 2020-06-24 DIAGNOSIS — Z800 Family history of malignant neoplasm of digestive organs: Secondary | ICD-10-CM | POA: Diagnosis not present

## 2020-06-24 DIAGNOSIS — Z7901 Long term (current) use of anticoagulants: Secondary | ICD-10-CM

## 2020-06-24 DIAGNOSIS — I110 Hypertensive heart disease with heart failure: Secondary | ICD-10-CM | POA: Diagnosis not present

## 2020-06-24 DIAGNOSIS — G588 Other specified mononeuropathies: Secondary | ICD-10-CM | POA: Diagnosis not present

## 2020-06-24 DIAGNOSIS — Z8371 Family history of colonic polyps: Secondary | ICD-10-CM | POA: Diagnosis not present

## 2020-06-24 NOTE — Telephone Encounter (Signed)
Noted. Routing to Kristen Harper, PA  

## 2020-06-24 NOTE — Assessment & Plan Note (Signed)
70 year old male with history of HTN, CHF, atrial fibrillation on Xarelto s/p atrial ablation in November 2020 resulting in right phrenic nerve palsy presenting today to schedule colonoscopy.  Per patient, last colonoscopy was 10 years ago at [facility] with no polyps.  He has no significant upper or lower GI symptoms.  No alarm symptoms.  Family history of mother with colon polyps and maternal uncle with colon cancer in his late 10s or 90s.   Regarding phrenic nerve palsy, patient states with daily activities, he has no shortness of breath.  He does require sleeping on his right side (has to think more about breathing when laying on his back or left side) and notes shortness of breath with exertion or bending over for any period of time.  Plan:  I will reach out to anesthesiology and Dr. Gala Romney regarding scheduling colonoscopy here at [facility]. We will also reach out to patient's cardiologist to get the okay to hold Xarelto for 48 hours prior to colonoscopy. Request colonoscopy records from [place]. Further recommendations to follow.

## 2020-06-24 NOTE — Telephone Encounter (Signed)
Dr. Curt Bears, pt was seen in our office today by Aliene Altes, PA. Is it ok for pt to hold Xarelto 48 hours prior to her Colonoscopy. Pt will be scheduled when we receive approval to hold Xarelto. Please advise.

## 2020-06-24 NOTE — Patient Instructions (Signed)
We are reaching out to Dr. Curt Bears to get the okay to hold Xarelto for 48 hours prior to colonoscopy.  I am also reaching out to anesthesia to get the okay to proceed with colonoscopy here at [facility].  I am requesting your colonoscopy records from [place].   You should hear from [place] within the next couple of weeks regarding scheduling your colonoscopy.  Please feel free to give our office a call if you have not heard from [place].  It was nice meeting you today!  Aliene Altes, PA-C [facility]

## 2020-06-24 NOTE — Telephone Encounter (Signed)
Ok to hold xarelto short term for procedure.

## 2020-06-26 NOTE — Telephone Encounter (Signed)
Called pt, TCS scheduled for 08/01/20--AM. Orders entered. Noted placed in order and informed endo scheduler for anesthesiologist to evaluate pt at pre-op appt.

## 2020-06-26 NOTE — Telephone Encounter (Signed)
Pre-op/covid test 07/30/20. Appt letter mailed with procedure instructions.

## 2020-06-26 NOTE — Telephone Encounter (Signed)
Noted.  I also spoke with Dr. Gala Romney who is agreeable to proceeding with colonoscopy.  Patient will have to lay on his left side for colonoscopy.  I spoke with Dr. Charna Elizabeth as patient lays on his right side to optimize his breathing. Dr. Charna Elizabeth states they can try starting with propofol, but patient may ultimately need intubation if he does not tolerate laying on his left side. Dr. Charna Elizabeth also recommended that one of the anesthesiologist will need evaluate patient during his preop evaluation.  I have discussed this with patient and he is agreeable to proceed with colonoscopy.   [facility] Clinical Pool:  We can proceed with scheduling colonoscopy with propofol with Dr. Gala Romney.  ASA III. Dx: Colon Cancer Screening.  He will need to hold Xarelto for 48 hours prior to procedure. Hold iron x7 days prior to procedure.   When arranging preop testing, please make note that anesthesiologist will need to evaluate patient and his ability to lay on his left side for procedure due to history of right phrenic nerve palsy. May need intubation.

## 2020-06-30 ENCOUNTER — Telehealth: Payer: Self-pay | Admitting: Gastroenterology

## 2020-06-30 ENCOUNTER — Encounter: Payer: Self-pay | Admitting: Gastroenterology

## 2020-06-30 NOTE — Telephone Encounter (Signed)
Received and reviewed colonoscopy report from [facility] dated 12/30/2010.  Procedure completed with Dr. Anthony Sar.  Indication: Screening  Findings: Normal-appearing cecum and ileocecal valve were identified.  The ascending, transverse, descending, sigmoid colon, and rectum appeared unremarkable.    Impression: Normal colonoscopy.   Patient is currently scheduled for screening colonoscopy.  No additional recommendations at this time.  We will have records scanned into patient's chart.

## 2020-07-17 DIAGNOSIS — Z299 Encounter for prophylactic measures, unspecified: Secondary | ICD-10-CM | POA: Diagnosis not present

## 2020-07-17 DIAGNOSIS — D6869 Other thrombophilia: Secondary | ICD-10-CM | POA: Diagnosis not present

## 2020-07-17 DIAGNOSIS — D692 Other nonthrombocytopenic purpura: Secondary | ICD-10-CM | POA: Diagnosis not present

## 2020-07-17 DIAGNOSIS — I10 Essential (primary) hypertension: Secondary | ICD-10-CM | POA: Diagnosis not present

## 2020-07-17 DIAGNOSIS — I429 Cardiomyopathy, unspecified: Secondary | ICD-10-CM | POA: Diagnosis not present

## 2020-07-17 DIAGNOSIS — I4891 Unspecified atrial fibrillation: Secondary | ICD-10-CM | POA: Diagnosis not present

## 2020-07-23 ENCOUNTER — Ambulatory Visit (INDEPENDENT_AMBULATORY_CARE_PROVIDER_SITE_OTHER): Payer: Medicare Other | Admitting: Neurology

## 2020-07-23 ENCOUNTER — Encounter: Payer: Self-pay | Admitting: Neurology

## 2020-07-23 VITALS — BP 150/77 | HR 58 | Ht 74.0 in | Wt 263.5 lb

## 2020-07-23 DIAGNOSIS — R200 Anesthesia of skin: Secondary | ICD-10-CM

## 2020-07-23 DIAGNOSIS — G629 Polyneuropathy, unspecified: Secondary | ICD-10-CM

## 2020-07-23 NOTE — Progress Notes (Signed)
[facility]  PATIENT: [name] DOB: 04/11/1951  REFERRING DOCTOR OR PCP:  Jerene Bears, MD SOURCE: Patient, notes from primary care.  _________________________________   HISTORICAL  CHIEF COMPLAINT:  Chief Complaint  Patient presents with  . New Patient (Initial Visit)    RM 83 w/ wife. Paper referral for peripheral neuropathy that started last summer. Was on treadmill 30-7min per day, feet started hurting. Saw podiatrist, told he had neuropathy. Then saw PCP. Referred to neurologist.  Having tightness in both ankles, intermittent. When he sits on recliner at night, gets cold/hot sensation in legs. Last week, he had burning pain in left heel. Stood up, it relieves. No history of diabetes. Tried gabapentin, but could not tolerate (felt groggy). Has prick intermittently.     HISTORY OF PRESENT ILLNESS:  I had the pleasure seeing your patient, [name], at [facility] for neurologic consultation regarding his foot numbness.  He is a 70 year old man with dysesthesias in his feet starting 6 months ago.   He noted they hurt more after he stopped walking on a treadmill.  He saw a podiatrist who felt he had neuropathy.   While up and about he denies dysesthesias.  However while resting, especially in a recliner, he notes an altered sensation in his legs.   He does not note any sensory changes while in bed, however.    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   He has no falls.   He does not need to hold on while closing his eyes to shampoo his hair in shower.   He denies any weakness or bladder changes.   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      He tried gabapentin but felt groggy the next day and stopped.  He has taken some OTC vitamin B12.  I saw him for RLS/PLMS once in 2017 but the leg movements are mild  at this time and not bothersome now.  He never needed any medication.  He does not have OSA.   [name]     REVIEW OF SYSTEMS: Constitutional: No fevers, chills, sweats, or change in appetite Eyes: No visual changes, double vision, eye pain Ear, nose and throat: No hearing loss, ear pain, nasal congestion, sore throat Cardiovascular: History of A. fib, treated with radioablation.  No chest pain, palpitations Respiratory: No shortness of breath at rest or with exertion.   No wheezes GastrointestinaI: No nausea, vomiting, diarrhea, abdominal pain, fecal incontinence Genitourinary: No dysuria, urinary retention or frequency.  No nocturia. Musculoskeletal: No neck pain, back pain Integumentary: No rash, pruritus, skin lesions Neurological: as above Psychiatric: No depression at this time.  No anxiety Endocrine: No palpitations, diaphoresis, change in appetite, change in weigh or increased thirst Hematologic/Lymphatic: No anemia, purpura, petechiae. Allergic/Immunologic: No itchy/runny eyes, nasal congestion, recent allergic reactions, rashes  ALLERGIES: Allergies  Allergen Reactions  . Azithromycin     QT WAVE    HOME MEDICATIONS:  Current Outpatient Medications:  .  acetaminophen (TYLENOL) 500 MG tablet, Take 1,000 mg by mouth every 6 (six) hours as needed for moderate pain., Disp: , Rfl:  .  allopurinol (ZYLOPRIM) 300 MG tablet, Take 300 mg by mouth daily. , Disp: , Rfl:  .  amLODipine (NORVASC) 5 MG tablet, TAKE ONE TABLET BY MOUTH DAILY (Patient taking differently: Take 5 mg by mouth daily.),  Disp: 30 tablet, Rfl: 0 .  buPROPion (WELLBUTRIN XL) 150 MG 24 hr tablet, Take 150 mg by mouth daily., Disp: , Rfl:  .  Calcium Carb-Cholecalciferol (CALCIUM + D3 PO), Take 1 tablet by mouth daily at 3 pm. , Disp: , Rfl:  .  carvedilol (COREG) 25 MG tablet, TAKE ONE TABLET BY MOUTH TWICE DAILY. TAKE WITH A MEAL. Please make yearly appt with Dr. Curt Bears for February 2022 for future refills. Thank  you 1st attempt (Patient taking differently: Take 25 mg by mouth 2 (two) times daily with a meal. TAKE ONE TABLET BY MOUTH TWICE DAILY. TAKE WITH A MEAL. Please make yearly appt with Dr. Curt Bears for February 2022 for future refills. Thank you 1st attempt), Disp: 90 tablet, Rfl: 0 .  CINNAMON PO, Take 350 mg by mouth daily at 3 pm. , Disp: , Rfl:  .  Coenzyme Q10 200 MG capsule, Take 400 mg by mouth daily in the afternoon. , Disp: , Rfl:  .  dofetilide (TIKOSYN) 250 MCG capsule, TAKE ONE CAPSULE BY MOUTH TWICE DAILY (Patient taking differently: Take 250 mcg by mouth 2 (two) times daily.), Disp: 180 capsule, Rfl: 2 .  Ferrous Gluconate-C-Folic Acid (IRON-C PO), Take 1 tablet by mouth daily., Disp: , Rfl:  .  Glucosamine-Chondroitin (GLUCOSAMINE CHONDR COMPLEX PO), Take 1 tablet by mouth daily. , Disp: , Rfl:  .  LUTEIN-ZEAXANTHIN PO, Take 2 tablets by mouth daily in the afternoon. , Disp: , Rfl:  .  Multiple Vitamins-Minerals (MULTIVITAMIN PO), Take 1 tablet by mouth daily at 3 pm. , Disp: , Rfl:  .  olmesartan (BENICAR) 40 MG tablet, Take 1 tablet (40 mg total) by mouth daily., Disp: 15 tablet, Rfl: 0 .  Omega-3 Fatty Acids (OMEGA 3 PO), Take 1,350 mg by mouth daily., Disp: , Rfl:  .  PHOSPHATIDYLCHOLINE PO, Take 2 tablets by mouth daily in the afternoon. , Disp: , Rfl:  .  potassium chloride (KLOR-CON) 10 MEQ tablet, Taking two tablets in the morning, (Patient taking differently: Take 20 mEq by mouth daily.), Disp: , Rfl:  .  Probiotic Product (PROBIOTIC DAILY PO), Take 1 tablet by mouth daily in the afternoon., Disp: , Rfl:  .  RESVERATROL 100 MG CAPS, Take 100 mg by mouth daily at 3 pm. , Disp: , Rfl:  .  rivaroxaban (XARELTO) 20 MG TABS tablet, Take 1 tablet (20 mg total) by mouth daily with supper., Disp: 90 tablet, Rfl: 1 .  tamsulosin (FLOMAX) 0.4 MG CAPS capsule, Take 0.4 mg by mouth daily. , Disp: , Rfl: 5  PAST MEDICAL HISTORY: Past Medical History:  Diagnosis Date  . Arthritis     "knuckles maybe" (02/28/2018)  . Atrial fib/flutter, transient    a. s/p TEE-guided DCCV on 12/14/2017 with return to [facility].   [name] BPH (benign prostatic hyperplasia) 12/11/2017  . CHF (congestive heart failure) ([place])    Echo in November 2020 with EF 45-50% mild LVH, global hypokinesis.  . Essential hypertension   . Gout    "I take RX qd" (02/28/2018)  . Neuropathy   . 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  [name] Pneumonia 11/2017  . Thyroid nodule    Benign    PAST SURGICAL HISTORY: Past Surgical History:  Procedure Laterality Date  . ATRIAL FIBRILLATION ABLATION N/A 04/04/2019   Procedure: ATRIAL FIBRILLATION ABLATION;  Surgeon: Constance Haw, MD;  Location: [facility];  Service: Cardiovascular;  Laterality: N/A;  .  BACK SURGERY    . BIOPSY THYROID    . CARDIOVERSION N/A 12/14/2017   Procedure: CARDIOVERSION;  Surgeon: Satira Sark, MD;  Location: [facility];  Service: Cardiovascular;  Laterality: N/A;  . CARDIOVERSION N/A 01/14/2019   Procedure: CARDIOVERSION;  Surgeon: Constance Haw, MD;  Location: [facility];  Service: Cardiovascular;  Laterality: N/A;  . CATARACT EXTRACTION W/ INTRAOCULAR LENS  IMPLANT, BILATERAL Bilateral 2006-2010   "right-left"  . COLONOSCOPY  12/30/2010   [facility]; Dr. Anthony Sar; normal colonoscopy.  Repeat in 10 years.  . IRRIGATION AND DEBRIDEMENT SEBACEOUS CYST    . LUMBAR LAMINECTOMY N/A 01/20/2013   Procedure: MICRODISCECTOMY LUMBAR LAMINECTOMY;  Surgeon: Marybelle Killings, MD;  Location: [facility];  Service: Orthopedics;  Laterality: N/A;  L4-5 Decompression  . PILONIDAL CYST DRAINAGE    . TEE WITHOUT CARDIOVERSION N/A 12/14/2017   Procedure: TRANSESOPHAGEAL ECHOCARDIOGRAM (TEE) WITH PROPOFOL;  Surgeon: Satira Sark, MD;  Location: [facility];  Service: Cardiovascular;  Laterality: N/A;    FAMILY HISTORY: Family History  Problem Relation Age of Onset  . Lung cancer Father   . Cancer - Lung  Father   . Atrial fibrillation Mother   . Dementia Mother   . Transient ischemic attack Mother   . Colon polyps Mother   . Hypertension Other   . Liver cancer Brother   . Colon cancer Maternal Uncle        diagnosed in his late 17s or early 92s    SOCIAL HISTORY:  Social History   Socioeconomic History  . Marital status: Married    Spouse name: Not on file  . Number of children: 3  . Years of education: Not on file  . Highest education level: Not on file  Occupational History  . Not on file  Tobacco Use  . Smoking status: Never Smoker  . Smokeless tobacco: Never Used  Vaping Use  . Vaping Use: Never used  Substance and Sexual Activity  . Alcohol use: Never    Alcohol/week: 0.0 standard drinks  . Drug use: Never  . Sexual activity: Not Currently  Other Topics Concern  . Not on file  Social History Narrative  . Not on file   Social Determinants of Health   Financial Resource Strain: Not on file  Food Insecurity: Not on file  Transportation Needs: Not on file  Physical Activity: Not on file  Stress: Not on file  Social Connections: Not on file  Intimate Partner Violence: Not on file     PHYSICAL EXAM  Vitals:   07/23/20 1108  BP: (!) 150/77  Pulse: (!) 58  Weight: 263 lb 8 oz (119.5 kg)  Height: $Remove'6\' 2"'XyZuiAV$  (1.88 m)    Body mass index is 33.83 kg/m.   General: The patient is well-developed and well-nourished and in no acute distress  HEENT:  Head is [place]/AT.  Sclera are anicteric.    Neck: No carotid bruits are noted.  The neck is nontender.  Cardiovascular: The heart has a regular rate and rhythm with a normal S1 and S2. There were no murmurs, gallops or rubs.    Skin: Extremities are without rash or  edema.  Musculoskeletal:  Back is nontender  Neurologic Exam  Mental status: The patient is alert and oriented x 3 at the time of the examination. The patient has apparent normal recent and remote memory, with an apparently normal attention span and  concentration ability.   Speech is normal.  Cranial nerves: Extraocular movements  are full. Pupils are equal, round, and reactive to light and accomodation.  Visual fields are full.  Facial symmetry is present.  Facial strength and sensation was normal.  Trapezius strength was normal.  No dysarthria is noted.  No obvious hearing deficits are noted.  Motor:  Muscle bulk is normal.   Tone is normal. Strength is  5 / 5 in all 4 extremities.   Sensory: Sensory testing is intact to pinprick, soft touch and vibration sensation in the arms and proximal legs.  He has reduced pinprick sensation in the toes with a gradient towards normal just above the ankle.  He reports normal vibration sensation of the ankles but only 10% vibration sensation at the toes, bilaterally.  Coordination: Cerebellar testing reveals good finger-nose-finger and heel-to-shin bilaterally.  Gait and station: Station is normal.   Gait is normal. Tandem gait is normal. Romberg is negative.   Reflexes: Deep tendon reflexes are symmetric and normal in arms, 1 at the knees and trace at the ankles.   Plantar responses are flexor.    DIAGNOSTIC DATA (LABS, IMAGING, TESTING) - I reviewed patient records, labs, notes, testing and imaging myself where available.  Lab Results  Component Value Date   WBC 7.0 04/08/2019   HGB 11.9 (L) 04/08/2019   HCT 36.8 (L) 04/08/2019   MCV 91.5 04/08/2019   PLT 122 (L) 04/08/2019      Component Value Date/Time   NA 141 06/30/2019 1348   NA 145 (H) 03/20/2019 1152   K 4.5 06/30/2019 1348   CL 106 06/30/2019 1348   CO2 27 06/30/2019 1348   GLUCOSE 139 (H) 06/30/2019 1348   BUN 18 06/30/2019 1348   BUN 15 03/20/2019 1152   CREATININE 1.06 06/30/2019 1348   CALCIUM 8.8 (L) 06/30/2019 1348   PROT 6.0 (L) 04/08/2019 0244   PROT 6.7 10/31/2015 1413   ALBUMIN 3.1 (L) 04/08/2019 0244   AST 22 04/08/2019 0244   ALT 23 04/08/2019 0244   ALKPHOS 45 04/08/2019 0244   BILITOT 1.3 (H) 04/08/2019  0244   GFRNONAA >60 06/30/2019 1348   GFRAA >60 06/30/2019 1348    Lab Results  Component Value Date   TSH 2.003 06/30/2019       ASSESSMENT AND PLAN  Polyneuropathy - Plan: Multiple Myeloma Panel (SPEP&IFE w/QIG), Sjogren's syndrome antibods(ssa + ssb), Vitamin B12, NCV with EMG(electromyography)  Numbness - Plan: Multiple Myeloma Panel (SPEP&IFE w/QIG), Sjogren's syndrome antibods(ssa + ssb), Vitamin B12, NCV with EMG(electromyography)   In summary, [name] Is a 70 year old man with dysesthesias in his feet for the past 6 months.  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  The etiology is uncertain.  He does not have hypoglycemia.  We will check an NCV/EMG.  Additionally, I will check blood work for SPEP/IEF, B12 and SSA/SSB.  Symptoms are not severe enough to treat at this time.  The dysesthesias only occur when he is reclined in his chair and not during the day.  If treatment is required we will consider lamotrigine.  I will see him when he returns for the EMG/NCV study and he should call sooner if he has new or worsening neurologic symptoms.  Thank you for asking me to see [name].  Please let me know if I can be of further assistance with him or other patients in the future.   Weylin Plagge A. Felecia Shelling, MD, [facility] 10/24/6071, 71:06 AM Certified in Neurology, Clinical Neurophysiology,  Sleep Medicine and Neuroimaging  [facility] [address], [place], [place] [address] [phone]

## 2020-07-25 LAB — MULTIPLE MYELOMA PANEL, SERUM
Albumin SerPl Elph-Mcnc: 3.8 g/dL (ref 2.9–4.4)
Albumin/Glob SerPl: 1.2 (ref 0.7–1.7)
Alpha 1: 0.2 g/dL (ref 0.0–0.4)
Alpha2 Glob SerPl Elph-Mcnc: 0.6 g/dL (ref 0.4–1.0)
B-Globulin SerPl Elph-Mcnc: 0.9 g/dL (ref 0.7–1.3)
Gamma Glob SerPl Elph-Mcnc: 1.5 g/dL (ref 0.4–1.8)
Globulin, Total: 3.2 g/dL (ref 2.2–3.9)
IgA/Immunoglobulin A, Serum: 170 mg/dL (ref 61–437)
IgG (Immunoglobin G), Serum: 1498 mg/dL (ref 603–1613)
IgM (Immunoglobulin M), Srm: 80 mg/dL (ref 15–143)
Total Protein: 7 g/dL (ref 6.0–8.5)

## 2020-07-25 LAB — SJOGREN'S SYNDROME ANTIBODS(SSA + SSB)
ENA SSA (RO) Ab: 0.2 AI (ref 0.0–0.9)
ENA SSB (LA) Ab: <0.2 AI (ref 0.0–0.9)

## 2020-07-25 LAB — VITAMIN B12: Vitamin B-12: 537 pg/mL (ref 232–1245)

## 2020-07-29 NOTE — Patient Instructions (Signed)
[name]  07/29/2020     @PREFPERIOPPHARMACY @   Your procedure is scheduled on  08/01/2020   Report to [facility] at  [place].   Call this number if you have problems the morning of surgery:  [phone]   Remember:  Follow the diet and prep instructions given to you by the office.                     Take these medicines the morning of surgery with A SIP OF WATER  Allopurinol, amlodipine, wellbutrin, carvedilol, tikosyn, flomax.   Your last dose of xarelto  should be 07/29/2020.       Your last dose of iron should have been 07/24/2020.    Please brush your teeth.  Do not wear jewelry, make-up or nail polish.  Do not wear lotions, powders, or perfumes, or deodorant.  Do not shave 48 hours prior to surgery.  Men may shave face and neck.  Do not bring valuables to the hospital.  [facility] is not responsible for any belongings or valuables.  Contacts, dentures or bridgework may not be worn into surgery.  Leave your suitcase in the car.  After surgery it may be brought to your room.  For patients admitted to the hospital, discharge time will be determined by your treatment team.  Patients discharged the day of surgery will not be allowed to drive home and must have someone with them for 24 hours.   Special instructions: DO NOT smoke tobacco or vape the morning of your procedure.  Please read over the following fact sheets that you were given. Anesthesia Post-op Instructions and Care and Recovery After Surgery       Colonoscopy, Adult, Care After This sheet gives you information about how to care for yourself after your procedure. Your health care provider may also give you more specific instructions. If you have problems or questions, contact your health care provider. What can I expect after the procedure? After the procedure, it is common to have:  A small amount of blood in your stool for 24 hours after the procedure.  Some gas.  Mild cramping or  bloating of your abdomen. Follow these instructions at home: Eating and drinking  Drink enough fluid to keep your urine pale yellow.  Follow instructions from your health care provider about eating or drinking restrictions.  Resume your normal diet as instructed by your health care provider. Avoid heavy or fried foods that are hard to digest.   Activity  Rest as told by your health care provider.  Avoid sitting for a long time without moving. Get up to take short walks every 1-2 hours. This is important to improve blood flow and breathing. Ask for help if you feel weak or unsteady.  Return to your normal activities as told by your health care provider. Ask your health care provider what activities are safe for you. Managing cramping and bloating  Try walking around when you have cramps or feel bloated.  Apply heat to your abdomen as told by your health care provider. Use the heat source that your health care provider recommends, such as a moist heat pack or a heating pad. ? Place a towel between your skin and the heat source. ? Leave the heat on for 20-30 minutes. ? Remove the heat if your skin turns bright red. This is especially important if you are unable to feel pain, heat, or cold.  You may have a greater risk of getting burned.   General instructions  If you were given a sedative during the procedure, it can affect you for several hours. Do not drive or operate machinery until your health care provider says that it is safe.  For the first 24 hours after the procedure: ? Do not sign important documents. ? Do not drink alcohol. ? Do your regular daily activities at a slower pace than normal. ? Eat soft foods that are easy to digest.  Take over-the-counter and prescription medicines only as told by your health care provider.  Keep all follow-up visits as told by your health care provider. This is important. Contact a health care provider if:  You have blood in your stool 2-3  days after the procedure. Get help right away if you have:  More than a small spotting of blood in your stool.  Large blood clots in your stool.  Swelling of your abdomen.  Nausea or vomiting.  A fever.  Increasing pain in your abdomen that is not relieved with medicine. Summary  After the procedure, it is common to have a small amount of blood in your stool. You may also have mild cramping and bloating of your abdomen.  If you were given a sedative during the procedure, it can affect you for several hours. Do not drive or operate machinery until your health care provider says that it is safe.  Get help right away if you have a lot of blood in your stool, nausea or vomiting, a fever, or increased pain in your abdomen. This information is not intended to replace advice given to you by your health care provider. Make sure you discuss any questions you have with your health care provider. Document Revised: 05/05/2019 Document Reviewed: 12/05/2018 Elsevier Patient Education  2021 [employer] After This sheet gives you information about how to care for yourself after your procedure. Your health care provider may also give you more specific instructions. If you have problems or questions, contact your health care provider. What can I expect after the procedure? After the procedure, it is common to have:  Tiredness.  Forgetfulness about what happened after the procedure.  Impaired judgment for important decisions.  Nausea or vomiting.  Some difficulty with balance. Follow these instructions at home: For the time period you were told by your health care provider:  Rest as needed.  Do not participate in activities where you could fall or become injured.  Do not drive or use machinery.  Do not drink alcohol.  Do not take sleeping pills or medicines that cause drowsiness.  Do not make important decisions or sign legal documents.  Do not  take care of children on your own.      Eating and drinking  Follow the diet that is recommended by your health care provider.  Drink enough fluid to keep your urine pale yellow.  If you vomit: ? Drink water, juice, or soup when you can drink without vomiting. ? Make sure you have little or no nausea before eating solid foods. General instructions  Have a responsible adult stay with you for the time you are told. It is important to have someone help care for you until you are awake and alert.  Take over-the-counter and prescription medicines only as told by your health care provider.  If you have sleep apnea, surgery and certain medicines can increase your risk for breathing problems. Follow instructions from your health  care provider about wearing your sleep device: ? Anytime you are sleeping, including during daytime naps. ? While taking prescription pain medicines, sleeping medicines, or medicines that make you drowsy.  Avoid smoking.  Keep all follow-up visits as told by your health care provider. This is important. Contact a health care provider if:  You keep feeling nauseous or you keep vomiting.  You feel light-headed.  You are still sleepy or having trouble with balance after 24 hours.  You develop a rash.  You have a fever.  You have redness or swelling around the IV site. Get help right away if:  You have trouble breathing.  You have new-onset confusion at home. Summary  For several hours after your procedure, you may feel tired. You may also be forgetful and have poor judgment.  Have a responsible adult stay with you for the time you are told. It is important to have someone help care for you until you are awake and alert.  Rest as told. Do not drive or operate machinery. Do not drink alcohol or take sleeping pills.  Get help right away if you have trouble breathing, or if you suddenly become confused. This information is not intended to replace advice  given to you by your health care provider. Make sure you discuss any questions you have with your health care provider. Document Revised: 01/25/2020 Document Reviewed: 04/13/2019 Elsevier Patient Education  2021 [employer].

## 2020-07-30 ENCOUNTER — Other Ambulatory Visit (HOSPITAL_COMMUNITY)
Admission: RE | Admit: 2020-07-30 | Discharge: 2020-07-30 | Disposition: A | Discharge status: Home / Self Care | Payer: Medicare Other | Source: Ambulatory Visit | Attending: Internal Medicine | Admitting: Internal Medicine

## 2020-07-30 ENCOUNTER — Encounter (HOSPITAL_COMMUNITY)
Admission: RE | Admit: 2020-07-30 | Discharge: 2020-07-30 | Disposition: A | Discharge status: Home / Self Care | Payer: Medicare Other | Source: Ambulatory Visit | Attending: Internal Medicine | Admitting: Internal Medicine

## 2020-07-30 ENCOUNTER — Other Ambulatory Visit: Payer: Self-pay

## 2020-07-30 ENCOUNTER — Encounter (HOSPITAL_COMMUNITY): Payer: Self-pay

## 2020-07-30 DIAGNOSIS — Z20822 Contact with and (suspected) exposure to covid-19: Secondary | ICD-10-CM | POA: Diagnosis not present

## 2020-07-30 DIAGNOSIS — Z01818 Encounter for other preprocedural examination: Secondary | ICD-10-CM | POA: Diagnosis not present

## 2020-07-30 DIAGNOSIS — Z01810 Encounter for preprocedural cardiovascular examination: Secondary | ICD-10-CM | POA: Insufficient documentation

## 2020-07-30 LAB — CBC WITH DIFFERENTIAL/PLATELET
Abs Immature Granulocytes: 0.02 10*3/uL (ref 0.00–0.07)
Basophils Absolute: 0 10*3/uL (ref 0.0–0.1)
Basophils Relative: 0 %
Eosinophils Absolute: 0.1 10*3/uL (ref 0.0–0.5)
Eosinophils Relative: 1 %
HCT: 41.2 % (ref 39.0–52.0)
Hemoglobin: 13.3 g/dL (ref 13.0–17.0)
Immature Granulocytes: 0 %
Lymphocytes Relative: 20 %
Lymphs Abs: 1.1 10*3/uL (ref 0.7–4.0)
MCH: 29.9 pg (ref 26.0–34.0)
MCHC: 32.3 g/dL (ref 30.0–36.0)
MCV: 92.6 fL (ref 80.0–100.0)
Monocytes Absolute: 0.4 10*3/uL (ref 0.1–1.0)
Monocytes Relative: 7 %
Neutro Abs: 4.1 10*3/uL (ref 1.7–7.7)
Neutrophils Relative %: 72 %
Platelets: 116 10*3/uL — ABNORMAL LOW (ref 150–400)
RBC: 4.45 MIL/uL (ref 4.22–5.81)
RDW: 12.9 % (ref 11.5–15.5)
WBC: 5.7 10*3/uL (ref 4.0–10.5)
nRBC: 0 % (ref 0.0–0.2)

## 2020-07-30 LAB — BASIC METABOLIC PANEL
Anion gap: 8 (ref 5–15)
BUN: 20 mg/dL (ref 8–23)
CO2: 23 mmol/L (ref 22–32)
Calcium: 8.6 mg/dL — ABNORMAL LOW (ref 8.9–10.3)
Chloride: 107 mmol/L (ref 98–111)
Creatinine, Ser: 0.85 mg/dL (ref 0.61–1.24)
GFR, Estimated: >60 mL/min (ref >60)
Glucose, Bld: 94 mg/dL (ref 70–99)
Potassium: 4.3 mmol/L (ref 3.5–5.1)
Sodium: 138 mmol/L (ref 135–145)

## 2020-07-30 LAB — SARS CORONAVIRUS 2 (TAT 6-24 HRS): SARS Coronavirus 2: NEGATIVE

## 2020-08-01 ENCOUNTER — Ambulatory Visit (HOSPITAL_COMMUNITY): Payer: Medicare Other | Admitting: Anesthesiology

## 2020-08-01 ENCOUNTER — Encounter (HOSPITAL_COMMUNITY)
Admission: RE | Disposition: A | Discharge status: Home / Self Care | Payer: Self-pay | Source: Home / Self Care | Attending: Internal Medicine

## 2020-08-01 ENCOUNTER — Ambulatory Visit (HOSPITAL_COMMUNITY)
Admission: RE | Admit: 2020-08-01 | Discharge: 2020-08-01 | Disposition: A | Discharge status: Home / Self Care | Payer: Medicare Other | Attending: Internal Medicine | Admitting: Internal Medicine

## 2020-08-01 ENCOUNTER — Encounter (HOSPITAL_COMMUNITY): Payer: Self-pay | Admitting: Internal Medicine

## 2020-08-01 DIAGNOSIS — Z79899 Other long term (current) drug therapy: Secondary | ICD-10-CM | POA: Diagnosis not present

## 2020-08-01 DIAGNOSIS — I2510 Atherosclerotic heart disease of native coronary artery without angina pectoris: Secondary | ICD-10-CM | POA: Diagnosis not present

## 2020-08-01 DIAGNOSIS — Z1211 Encounter for screening for malignant neoplasm of colon: Secondary | ICD-10-CM

## 2020-08-01 DIAGNOSIS — K635 Polyp of colon: Secondary | ICD-10-CM

## 2020-08-01 DIAGNOSIS — D123 Benign neoplasm of transverse colon: Secondary | ICD-10-CM | POA: Diagnosis not present

## 2020-08-01 DIAGNOSIS — Z7901 Long term (current) use of anticoagulants: Secondary | ICD-10-CM | POA: Diagnosis not present

## 2020-08-01 HISTORY — PX: COLONOSCOPY WITH PROPOFOL: SHX5780

## 2020-08-01 HISTORY — PX: POLYPECTOMY: SHX149

## 2020-08-01 SURGERY — COLONOSCOPY WITH PROPOFOL
Anesthesia: General

## 2020-08-01 MED ORDER — PROPOFOL 500 MG/50ML IV EMUL
INTRAVENOUS | Status: DC | PRN
  Administered 2020-08-01: 150 ug/kg/min via INTRAVENOUS

## 2020-08-01 MED ORDER — PHENYLEPHRINE 40 MCG/ML (10ML) SYRINGE FOR IV PUSH (FOR BLOOD PRESSURE SUPPORT)
PREFILLED_SYRINGE | INTRAVENOUS | Status: DC | PRN
  Administered 2020-08-01: 40 ug via INTRAVENOUS
  Administered 2020-08-01: 120 ug via INTRAVENOUS
  Administered 2020-08-01 (×3): 80 ug via INTRAVENOUS

## 2020-08-01 MED ORDER — LIDOCAINE HCL (CARDIAC) PF 100 MG/5ML IV SOSY
PREFILLED_SYRINGE | INTRAVENOUS | Status: DC | PRN
  Administered 2020-08-01: 50 mg via INTRAVENOUS

## 2020-08-01 MED ORDER — PROPOFOL 10 MG/ML IV BOLUS
INTRAVENOUS | Status: DC | PRN
  Administered 2020-08-01: 100 mg via INTRAVENOUS

## 2020-08-01 MED ORDER — LACTATED RINGERS IV SOLN
INTRAVENOUS | Status: DC
  Administered 2020-08-01: 1000 mL via INTRAVENOUS

## 2020-08-01 MED ORDER — STERILE WATER FOR IRRIGATION IR SOLN
Status: DC | PRN
  Administered 2020-08-01: 200 mL

## 2020-08-01 NOTE — Transfer of Care (Signed)
Immediate Anesthesia Transfer of Care Note  Patient: [name]  Procedure(s) Performed: COLONOSCOPY WITH PROPOFOL (N/A ) POLYPECTOMY INTESTINAL  Patient Location: Short Stay  Anesthesia Type:General  Level of Consciousness: awake, alert  and oriented  Airway & Oxygen Therapy: Patient Spontanous Breathing  Post-op Assessment: Report given to RN, Post -op Vital signs reviewed and stable and Patient moving all extremities X 4  Post vital signs: Reviewed and stable  Last Vitals:  Vitals Value Taken Time  BP    Temp    Pulse    Resp    SpO2      Last Pain:  Vitals:   08/01/20 0729  TempSrc:   PainSc: 0-No pain      Patients Stated Pain Goal: 7 (48/34/75 8307)  Complications: No complications documented.

## 2020-08-01 NOTE — Anesthesia Preprocedure Evaluation (Signed)
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 on medications + CAD and +CHF ( EF 45-50% mild LVH, global hypokinesis, increased troponins after ablation)  (-) Past MI (increased troponins after ablation) Normal cardiovascular exam+ dysrhythmias (ablation - 03/2019) Atrial Fibrillation  Rhythm:Regular Rate:Normal  Echo in November 2020 with EF 45-50% mild LVH, global hypokinesis  30-Jul-2020 14:07:27 [facility] ROUTINE RECORD Sinus bradycardia Left ventricular hypertrophy Abnormal ECG Baseline artifact persists Confirmed by Glori Bickers [phone]) on 07/31/2020 12:13:33 AM   Neuro/Psych  Neuromuscular disease negative psych ROS   GI/Hepatic negative GI ROS, Neg liver ROS,   Endo/Other  negative endocrine ROS  Renal/GU negative Renal ROS     Musculoskeletal  (+) Arthritis , Osteoarthritis,    Abdominal   Peds  Hematology  (+) anemia ,   Anesthesia Other Findings   Reproductive/Obstetrics                            Anesthesia Physical Anesthesia Plan  ASA: III  Anesthesia Plan: General   Post-op Pain Management:    Induction: Intravenous  PONV Risk Score and Plan: Propofol infusion and TIVA  Airway Management Planned: Nasal Cannula, Natural Airway and Simple Face Mask  Additional Equipment:    Intra-op Plan:   Post-operative Plan:   Informed Consent: I have reviewed the patients History and Physical, chart, labs and discussed the procedure including the risks, benefits and alternatives for the proposed anesthesia with the patient or authorized representative who has indicated his/her understanding and acceptance.     Dental advisory given  Plan Discussed with: CRNA and Surgeon  Anesthesia Plan Comments:         Anesthesia Quick Evaluation

## 2020-08-01 NOTE — Op Note (Signed)
[facility] Patient Name: [name] Procedure Date: 08/01/2020 7:03 AM MRN: [id number] Date of Birth: 23-Jun-1950 Attending MD: Norvel Richards , MD CSN: [id number] Age: 70 Admit Type: Outpatient Procedure:                Colonoscopy Indications:              Screening for colorectal malignant neoplasm Providers:                Norvel Richards, MD, Hardin Page, [place]                            Risa Grill, Technician Referring MD:              Medicines:                Propofol per Anesthesia Complications:            No immediate complications. Estimated Blood Loss:     Estimated blood loss was minimal. Procedure:                Pre-Anesthesia Assessment:                           - Prior to the procedure, a History and Physical                            was performed, and patient medications and                            allergies were reviewed. The patient's tolerance of                            previous anesthesia was also reviewed. The risks                            and benefits of the procedure and the sedation                            options and risks were discussed with the patient.                            All questions were answered, and informed consent                            was obtained. Prior Anticoagulants: The patient                            last took Xarelto (rivaroxaban) 3 days prior to the                            procedure. ASA Grade Assessment: III - A patient                            with severe systemic disease. After reviewing the  risks and benefits, the patient was deemed in                            satisfactory condition to undergo the procedure.                           After obtaining informed consent, the colonoscope                            was passed under direct vision. Throughout the                            procedure, the patient's blood pressure, pulse, and                             oxygen saturations were monitored continuously. The                            CF-[id number] ([id number]) scope was introduced through                            the anus and advanced to the the cecum, identified                            by appendiceal orifice and ileocecal valve. The                            colonoscopy was performed without difficulty. The                            patient tolerated the procedure well. The quality                            of the bowel preparation was adequate. Scope In: 7:33:05 AM Scope Out: 0:10:27 AM Scope Withdrawal Time: 0 hours 9 minutes 18 seconds  Total Procedure Duration: 0 hours 13 minutes 17 seconds  Findings:      The perianal and digital rectal examinations were normal.      A 5 mm polyp was found in the splenic flexure. The polyp was       semi-pedunculated. The polyp was removed with a cold snare. Resection       and retrieval were complete. Estimated blood loss was minimal.      The exam was otherwise without abnormality on direct and retroflexion       views. Impression:               - One 5 mm polyp at the splenic flexure, removed                            with a cold snare. Resected and retrieved.                           - The examination was otherwise normal on direct  and retroflexion views. Moderate Sedation:      Moderate (conscious) sedation was personally administered by an       anesthesia professional. The following parameters were monitored: oxygen       saturation, heart rate, blood pressure, respiratory rate, EKG, adequacy       of pulmonary ventilation, and response to care. Recommendation:           - Patient has a contact number available for                            emergencies. The signs and symptoms of potential                            delayed complications were discussed with the                            patient. Return to normal activities tomorrow.                             Written discharge instructions were provided to the                            patient.                           - Resume previous diet.                           - Repeat colonoscopy date to be determined after                            pending pathology results are reviewed for                            surveillance based on pathology results.                           - Return to GI office (date not yet determined).                            Resume Xarelto today Procedure Code(s):        --- Professional ---                           [phone], Colonoscopy, flexible; with removal of                            tumor(s), polyp(s), or other lesion(s) by snare                            technique Diagnosis Code(s):        --- Professional ---                           Z12.11, Encounter for screening for malignant  neoplasm of colon                           K63.5, Polyp of colon CPT copyright 2019 American Medical Association. All rights reserved. The codes documented in this report are preliminary and upon coder review may  be revised to meet current compliance requirements. Cristopher Estimable. Anum Palecek, MD Norvel Richards, MD 08/01/2020 7:52:25 AM This report has been signed electronically. Number of Addenda: 0

## 2020-08-01 NOTE — Discharge Instructions (Addendum)
Colonoscopy Discharge Instructions  Read the instructions outlined below and refer to this sheet in the next few weeks. These discharge instructions provide you with general information on caring for yourself after you leave the hospital. Your doctor may also give you specific instructions. While your treatment has been planned according to the most current medical practices available, unavoidable complications occasionally occur. If you have any problems or questions after discharge, call Dr. Gala Romney at [phone]. ACTIVITY  You may resume your regular activity, but move at a slower pace for the next 24 hours.   Take frequent rest periods for the next 24 hours.   Walking will help get rid of the air and reduce the bloated feeling in your belly (abdomen).   No driving for 24 hours (because of the medicine (anesthesia) used during the test).    Do not sign any important legal documents or operate any machinery for 24 hours (because of the anesthesia used during the test).  NUTRITION  Drink plenty of fluids.   You may resume your normal diet as instructed by your doctor.   Begin with a light meal and progress to your normal diet. Heavy or fried foods are harder to digest and may make you feel sick to your stomach (nauseated).   Avoid alcoholic beverages for 24 hours or as instructed.  MEDICATIONS  You may resume your normal medications unless your doctor tells you otherwise.  WHAT YOU CAN EXPECT TODAY  Some feelings of bloating in the abdomen.   Passage of more gas than usual.   Spotting of blood in your stool or on the toilet paper.  IF YOU HAD POLYPS REMOVED DURING THE COLONOSCOPY:  No aspirin products for 7 days or as instructed.   No alcohol for 7 days or as instructed.   Eat a soft diet for the next 24 hours.  FINDING OUT THE RESULTS OF YOUR TEST Not all test results are available during your visit. If your test results are not back during the visit, make an appointment  with your caregiver to find out the results. Do not assume everything is normal if you have not heard from your caregiver or the medical facility. It is important for you to follow up on all of your test results.  SEEK IMMEDIATE MEDICAL ATTENTION IF:  You have more than a spotting of blood in your stool.   Your belly is swollen (abdominal distention).   You are nauseated or vomiting.   You have a temperature over 101.   You have abdominal pain or discomfort that is severe or gets worse throughout the day.   1 polyp removed in your colon today  Further recommendations to follow pending review of pathology report  At patient request, called Helga at [phone] reviewed results and recommendations  Resume Xarelto today  Monitored Anesthesia Care, Care After This sheet gives you information about how to care for yourself after your procedure. Your health care provider may also give you more specific instructions. If you have problems or questions, contact your health care provider. What can I expect after the procedure? After the procedure, it is common to have:  Tiredness.  Forgetfulness about what happened after the procedure.  Impaired judgment for important decisions.  Nausea or vomiting.  Some difficulty with balance. Follow these instructions at home: For the time period you were told by your health care provider:  Rest as needed.  Do not participate in activities where you could fall or become injured.  Do  not drive or use machinery.  Do not drink alcohol.  Do not take sleeping pills or medicines that cause drowsiness.  Do not make important decisions or sign legal documents.  Do not take care of children on your own.      Eating and drinking  Follow the diet that is recommended by your health care provider.  Drink enough fluid to keep your urine pale yellow.  If you vomit: ? Drink water, juice, or soup when you can drink without vomiting. ? Make sure  you have little or no nausea before eating solid foods. General instructions  Have a responsible adult stay with you for the time you are told. It is important to have someone help care for you until you are awake and alert.  Take over-the-counter and prescription medicines only as told by your health care provider.  If you have sleep apnea, surgery and certain medicines can increase your risk for breathing problems. Follow instructions from your health care provider about wearing your sleep device: ? Anytime you are sleeping, including during daytime naps. ? While taking prescription pain medicines, sleeping medicines, or medicines that make you drowsy.  Avoid smoking.  Keep all follow-up visits as told by your health care provider. This is important. Contact a health care provider if:  You keep feeling nauseous or you keep vomiting.  You feel light-headed.  You are still sleepy or having trouble with balance after 24 hours.  You develop a rash.  You have a fever.  You have redness or swelling around the IV site. Get help right away if:  You have trouble breathing.  You have new-onset confusion at home. Summary  For several hours after your procedure, you may feel tired. You may also be forgetful and have poor judgment.  Have a responsible adult stay with you for the time you are told. It is important to have someone help care for you until you are awake and alert.  Rest as told. Do not drive or operate machinery. Do not drink alcohol or take sleeping pills.  Get help right away if you have trouble breathing, or if you suddenly become confused. This information is not intended to replace advice given to you by your health care provider. Make sure you discuss any questions you have with your health care provider. Document Revised: 01/25/2020 Document Reviewed: 04/13/2019 Elsevier Patient Education  2021 [employer].

## 2020-08-01 NOTE — H&P (Signed)
@LOGO @   Primary Care Physician:  Glenda Chroman, MD Primary Gastroenterologist:  Dr. Gala Romney  Pre-Procedure History & Physical: HPI:  [name] is a 70 y.o. male is here for a screening colonoscopy.  No bowel symptoms.  Negative colonoscopy 10 years ago.  No first-degree relatives with colon cancer.  Mother may have had polyps.  Xarelto held 3 days ago  Past Medical History:  Diagnosis Date  . Arthritis    "knuckles maybe" (02/28/2018)  . Atrial fib/flutter, transient    a. s/p TEE-guided DCCV on 12/14/2017 with return to NS[facility].   [name] BPH (benign prostatic hyperplasia) 12/11/2017  . CHF (congestive heart failure) ([place])    Echo in November 2020 with EF 45-50% mild LVH, global hypokinesis.  . Essential hypertension   . Gout    "I take RX qd" (02/28/2018)  . Neuropathy   . 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  [name] Pneumonia 11/2017  . Thyroid nodule    Benign    Past Surgical History:  Procedure Laterality Date  . ATRIAL FIBRILLATION ABLATION N/A 04/04/2019   Procedure: ATRIAL FIBRILLATION ABLATION;  Surgeon: Constance Haw, MD;  Location: [facility];  Service: Cardiovascular;  Laterality: N/A;  . BACK SURGERY    . BIOPSY THYROID    . CARDIOVERSION N/A 12/14/2017   Procedure: CARDIOVERSION;  Surgeon: Satira Sark, MD;  Location: [facility];  Service: Cardiovascular;  Laterality: N/A;  . CARDIOVERSION N/A 01/14/2019   Procedure: CARDIOVERSION;  Surgeon: Constance Haw, MD;  Location: [place];  Service: Cardiovascular;  Laterality: N/A;  . CATARACT EXTRACTION W/ INTRAOCULAR LENS  IMPLANT, BILATERAL Bilateral 2006-2010   "right-left"  . COLONOSCOPY  12/30/2010   [facility]; Dr. Anthony Sar; normal colonoscopy.  Repeat in 10 years.  . IRRIGATION AND DEBRIDEMENT SEBACEOUS CYST    . LUMBAR LAMINECTOMY N/A 01/20/2013   Procedure: MICRODISCECTOMY LUMBAR LAMINECTOMY;  Surgeon: Marybelle Killings, MD;  Location: [place];   Service: Orthopedics;  Laterality: N/A;  L4-5 Decompression  . PILONIDAL CYST DRAINAGE    . TEE WITHOUT CARDIOVERSION N/A 12/14/2017   Procedure: TRANSESOPHAGEAL ECHOCARDIOGRAM (TEE) WITH PROPOFOL;  Surgeon: Satira Sark, MD;  Location: [facility];  Service: Cardiovascular;  Laterality: N/A;    Prior to Admission medications   Medication Sig Start Date End Date Taking? Authorizing Provider  acetaminophen (TYLENOL) 500 MG tablet Take 1,000 mg by mouth every 6 (six) hours as needed for moderate pain.   Yes [provider]  allopurinol (ZYLOPRIM) 300 MG tablet Take 300 mg by mouth daily.    Yes [provider]  amLODipine (NORVASC) 5 MG tablet TAKE ONE TABLET BY MOUTH DAILY Patient taking differently: Take 5 mg by mouth daily. 01/10/20  Yes Camnitz, Will Hassell Done, MD  buPROPion (WELLBUTRIN XL) 150 MG 24 hr tablet Take 150 mg by mouth daily.   Yes [provider]  Calcium Carb-Cholecalciferol (CALCIUM + D3 PO) Take 1 tablet by mouth daily at 3 pm.    Yes [provider]  carvedilol (COREG) 25 MG tablet TAKE ONE TABLET BY MOUTH TWICE DAILY. TAKE WITH A MEAL. Please make yearly appt with Dr. Curt Bears for February 2022 for future refills. Thank you 1st attempt Patient taking differently: Take 25 mg by mouth 2 (two) times daily with a meal. TAKE ONE TABLET BY MOUTH TWICE DAILY. TAKE WITH A MEAL. Please make yearly appt with Dr. Curt Bears for February 2022 for future refills. Thank  you 1st attempt 05/01/20  Yes Camnitz, Will Hassell Done, MD  CINNAMON PO Take 350 mg by mouth daily at 3 pm.    Yes [provider]  Coenzyme Q10 200 MG capsule Take 400 mg by mouth daily in the afternoon.    Yes [provider]  dofetilide (TIKOSYN) 250 MCG capsule TAKE ONE CAPSULE BY MOUTH TWICE DAILY Patient taking differently: Take 250 mcg by mouth 2 (two) times daily. 01/25/20  Yes Camnitz, Will Hassell Done, MD  Ferrous Gluconate-C-Folic Acid (IRON-C PO) Take 1 tablet by mouth daily.    Yes [provider]  Glucosamine-Chondroitin (GLUCOSAMINE CHONDR COMPLEX PO) Take 1 tablet by mouth daily.    Yes [provider]  LUTEIN-ZEAXANTHIN PO Take 2 tablets by mouth daily in the afternoon.    Yes [provider]  Multiple Vitamins-Minerals (MULTIVITAMIN PO) Take 1 tablet by mouth daily at 3 pm.    Yes [provider]  olmesartan (BENICAR) 40 MG tablet Take 1 tablet (40 mg total) by mouth daily. 05/01/20  Yes Verta Ellen., NP  Omega-3 Fatty Acids (OMEGA 3 PO) Take 1,350 mg by mouth daily.   Yes [provider]  PHOSPHATIDYLCHOLINE PO Take 2 tablets by mouth daily in the afternoon.    Yes [provider]  potassium chloride (KLOR-CON) 10 MEQ tablet Taking two tablets in the morning, Patient taking differently: Take 20 mEq by mouth daily. 05/01/20  Yes Camnitz, Ocie Doyne, MD  Probiotic Product (PROBIOTIC DAILY PO) Take 1 tablet by mouth daily in the afternoon.   Yes [provider]  RESVERATROL 100 MG CAPS Take 100 mg by mouth daily at 3 pm.    Yes [provider]  rivaroxaban (XARELTO) 20 MG TABS tablet Take 1 tablet (20 mg total) by mouth daily with supper. 05/28/20  Yes Camnitz, Will Hassell Done, MD  tamsulosin (FLOMAX) 0.4 MG CAPS capsule Take 0.4 mg by mouth daily.  08/06/15  Yes [provider]    Allergies as of 06/26/2020 - Review Complete 06/24/2020  Allergen Reaction Noted  . Azithromycin  06/29/2018    Family History  Problem Relation Age of Onset  . Lung cancer Father   . Cancer - Lung Father   . Atrial fibrillation Mother   . Dementia Mother   . Transient ischemic attack Mother   . Colon polyps Mother   . Hypertension Other   . Liver cancer Brother   . Colon cancer Maternal Uncle        diagnosed in his late 46s or early 79s    Social History   Socioeconomic History  . Marital status: Married    Spouse name: Not on file  . Number of children: 3  . Years of education: Not on  file  . Highest education level: Not on file  Occupational History  . Not on file  Tobacco Use  . Smoking status: Never Smoker  . Smokeless tobacco: Never Used  Vaping Use  . Vaping Use: Never used  Substance and Sexual Activity  . Alcohol use: Never    Alcohol/week: 0.0 standard drinks  . Drug use: Never  . Sexual activity: Not Currently  Other Topics Concern  . Not on file  Social History Narrative  . Not on file   Social Determinants of Health   Financial Resource Strain: Not on file  Food Insecurity: Not on file  Transportation Needs: Not on file  Physical Activity: Not on file  Stress: Not on file  Social  Connections: Not on file  Intimate Partner Violence: Not on file    Review of Systems: See HPI, otherwise negative ROS  Physical Exam: BP 133/69   Temp 97.7 F (36.5 C) (Oral)   Resp 18   SpO2 100%  General:   Alert,  Well-developed, well-nourished, pleasant and cooperative in NAD Lungs:  Clear throughout to auscultation.   No wheezes, crackles, or rhonchi. No acute distress. Heart:  Regular rate and rhythm; no murmurs, clicks, rubs,  or gallops. Abdomen:  Soft, nontender and nondistended. No masses, hepatosplenomegaly or hernias noted. Normal bowel sounds, without guarding, and without rebound.    Impression/Plan: [name] is now here to undergo a screening colonoscopy.  Average rescreening examination.  Risks, benefits, limitations, imponderables and alternatives regarding colonoscopy have been reviewed with the patient. Questions have been answered. All parties agreeable.     Notice:  This dictation was prepared with Dragon dictation along with smaller phrase technology. Any transcriptional errors that result from this process are unintentional and may not be corrected upon review.

## 2020-08-01 NOTE — Anesthesia Postprocedure Evaluation (Signed)
Anesthesia Post Note  Patient: [name]  Procedure(s) Performed: COLONOSCOPY WITH PROPOFOL (N/A ) POLYPECTOMY INTESTINAL  Patient location during evaluation: Phase II Anesthesia Type: General Level of consciousness: awake and alert and oriented Pain management: pain level controlled Vital Signs Assessment: post-procedure vital signs reviewed and stable Respiratory status: spontaneous breathing, nonlabored ventilation and respiratory function stable Cardiovascular status: blood pressure returned to baseline and stable Postop Assessment: no apparent nausea or vomiting Anesthetic complications: no   No complications documented.   Last Vitals:  Vitals:   08/01/20 0647 08/01/20 0756  BP: 133/69 (!) 106/48  Pulse:  (!) 58  Resp: 18 18  Temp: 36.5 C 36.8 C  SpO2: 100% 95%    Last Pain:  Vitals:   08/01/20 0756  TempSrc: Oral  PainSc: 0-No pain                 Orlie Dakin

## 2020-08-01 NOTE — Anesthesia Procedure Notes (Signed)
Date/Time: 08/01/2020 7:37 AM Performed by: Orlie Dakin, CRNA Pre-anesthesia Checklist: Patient identified, Emergency Drugs available, Suction available and Patient being monitored Patient Re-evaluated:Patient Re-evaluated prior to induction Oxygen Delivery Method: Nasal cannula Induction Type: IV induction Placement Confirmation: positive ETCO2

## 2020-08-02 ENCOUNTER — Encounter: Payer: Self-pay | Admitting: Internal Medicine

## 2020-08-02 LAB — SURGICAL PATHOLOGY

## 2020-08-06 ENCOUNTER — Encounter (HOSPITAL_COMMUNITY): Payer: Self-pay | Admitting: Internal Medicine

## 2020-08-06 ENCOUNTER — Ambulatory Visit (INDEPENDENT_AMBULATORY_CARE_PROVIDER_SITE_OTHER): Payer: Medicare Other | Admitting: Neurology

## 2020-08-06 ENCOUNTER — Encounter (INDEPENDENT_AMBULATORY_CARE_PROVIDER_SITE_OTHER): Payer: Medicare Other | Admitting: Neurology

## 2020-08-06 DIAGNOSIS — R200 Anesthesia of skin: Secondary | ICD-10-CM | POA: Diagnosis not present

## 2020-08-06 DIAGNOSIS — G629 Polyneuropathy, unspecified: Secondary | ICD-10-CM | POA: Diagnosis not present

## 2020-08-06 DIAGNOSIS — Z0289 Encounter for other administrative examinations: Secondary | ICD-10-CM

## 2020-08-06 NOTE — Progress Notes (Addendum)
Full Name: [name] Gender: Male MRN #: [id number] Date of Birth: 1950-06-29    Visit Date: 08/06/2020 08:14 Age: 70 Years Examining Physician: Arlice Colt, MD  Referring Physician: Arlice Colt, MD    History: [name]  is a 70 year old man with numbness in the bilateral feet and ankle not associated with pain and minimal numbness of the hands.  On examination, strength was normal.  He had reduced sensation in the feet to pinprick up to the ankles.  He had reduced sensation to vibration only in the toes.  Deep tendon reflexes were reduced at the knees and absent at the ankles.  Nerve conduction studies: In the left arm, the left median motor response had a delayed distal latency with reduced amplitude and normal conduction velocity.  A Martin-Gruber anastomosis was noted.  The left ulnar motor response had a borderline delayed distal latency with normal amplitude and forearm and elbow conduction velocity.  The median sensory response had a delayed distal latency and reduced amplitude.  The ulnar sensory response was normal.  In the left leg, the left peroneal motor response was absent in the left tibial motor response had a normal distal latency with markedly reduced amplitude and mildly reduced conduction velocity.  Its F-wave latency was delayed.  The left sural and left superficial peroneal sensory responses were absent.  Electromyography: Needle EMG of selected muscles of the left leg was performed.  There was no acute denervation in any of the muscles tested.  There was mild chronic denervation noted in the tibialis anterior, peroneus longus, gluteus medius muscles and moderate chronic denervation in the abductor hallucis muscle of the foot.  Needle EMG of selected muscles of the left hand was normal.  Impression: This NCV/EMG study shows the following: 1.  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 2.  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 Felecia Shelling, MD, PhD, FAAN Certified in Neurology, Clinical Neurophysiology, Sleep Medicine, Pain Medicine and Neuroimaging Director, [place] [facility] [address] [place], [place] [address] [phone]   Verbal informed consent was obtained from the patient, patient was informed of potential risk of procedure, including bruising, bleeding, hematoma formation, infection, muscle weakness, muscle pain, numbness, among others.        Sabin    Nerve / Sites Muscle Latency Ref. Amplitude Ref. Rel Amp Segments Distance Velocity Ref. Area    ms ms mV mV %  cm m/s m/s mVms  L Median - APB     Wrist APB 5.4 ?4.4 0.4 ?4.0 100 Wrist - APB 7   0.7     Upper arm APB 9.3  0.6  155 Upper arm - Wrist 22 57 ?49 3.3     Ulnar Wrist APB 2.4  5.8  1050 Ulnar Wrist - APB    23.7     Ulnar Below Elbow APB 7.5  1.1  18.6 Ulnar Below Elbow - APB    3.8  L Ulnar - ADM     Wrist ADM 3.4 ?3.3 10.0 ?6.0 100 Wrist - ADM 7   30.9     B.Elbow ADM 7.0  3.0  30 B.Elbow - Wrist 20 55 ?49 10.1     A.Elbow ADM 8.8  9.6  320 A.Elbow - B.Elbow 10 56 ?49 31.5  L Peroneal - EDB     Ankle EDB NR ?6.5  NR ?2.0 NR Ankle - EDB 9   NR     Fib head EDB NR  NR  NR Fib head - Ankle 29 NR ?44 NR  L Tibial - AH     Ankle AH 4.4 ?5.8 0.5 ?4.0 100 Ankle - AH 9   1.6     Pop fossa AH 16.8  0.3  55.4 Pop fossa - Ankle 42 34 ?41 1.5             SNC    Nerve / Sites Rec. Site Peak Lat Ref.  Amp Ref. Segments Distance    ms ms V V  cm  L Sural - Ankle (Calf)     Calf Ankle NR ?4.4 NR ?6 Calf - Ankle 14  L Superficial peroneal - Ankle     Lat leg Ankle NR ?4.4 NR ?6 Lat leg - Ankle 14  L Median - Orthodromic (Dig II, Mid palm)     Dig II Wrist 3.8 ?3.4 4 ?10 Dig II - Wrist 13  L Ulnar - Orthodromic, (Dig V, Mid palm)     Dig V Wrist 2.8 ?3.1 5 ?5 Dig V - Wrist 44             F  Wave     Nerve F Lat Ref.   ms ms  L Tibial - AH 84.9 ?56.0  L Ulnar - ADM 29.9 ?32.0         EMG Summary Table    Spontaneous MUAP Recruitment  Muscle IA Fib PSW Fasc Other Amp Dur. Poly Pattern  L. Vastus medialis Normal None None None _______ Normal Normal 1+ Normal  L. Tibialis anterior Normal None None None _______ Increased Increased 2+ Reduced  L. Peroneus longus Normal None None None _______ Increased Increased 2+ Reduced  L. Gastrocnemius (Medial head) Normal None None None _______ Normal Normal 1+ Normal  L. Abductor hallucis Normal None None None _______ Normal Increased 2+ Discrete  L. Iliopsoas Normal None None None _______ Normal Normal Normal Normal  L. Gluteus medius Normal None None None _______ Normal Normal 1+ Reduced  L. First dorsal interosseous Normal None None None _______ Normal Normal Normal Normal  L. Abductor pollicis brevis Normal None None None _______ Normal Normal Normal Normal      [facility]  PATIENT: [name] DOB: 1950-10-15  REFERRING DOCTOR OR PCP:  Jerene Bears, MD SOURCE: Patient, notes from primary care.  _________________________________   HISTORICAL  CHIEF COMPLAINT:  Chief Complaint  Patient presents with  . Peripheral Neuropathy  . Numbness    HISTORY OF PRESENT ILLNESS:  [name] is a 70 year old man with dysesthesias in his feet beginning in mid 2021.  Initially,he noted they hurt more after he stopped walking on a treadmill.  He saw a podiatrist who felt he had neuropathy.   While up and about he denies dysesthesias.  However while resting, especially in a recliner, he notes an altered sensation in his legs.  Although he has not had any major stumbles or any falls, when he is standing on a riser in the church choir he feels unsteady.   He does not need to hold on while closing his eyes to shampoo his hair in shower.   He denies any weakness or bladder changes.   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  He tried gabapentin but felt groggy the next day and stopped.  He has taken some OTC vitamin B12.   REVIEW OF SYSTEMS: Constitutional: No fevers, chills, sweats, or change in appetite Eyes: No visual changes, double vision, eye pain Ear, nose and throat: No hearing loss, ear pain, nasal congestion, sore throat Cardiovascular: History of A. fib, treated with radioablation.  No chest pain, palpitations Respiratory: No shortness of breath at rest or with exertion.   No wheezes GastrointestinaI: No nausea, vomiting, diarrhea, abdominal pain, fecal incontinence Genitourinary: No dysuria, urinary retention or frequency.  No nocturia. Musculoskeletal: No neck pain, back pain Integumentary: No rash, pruritus, skin lesions Neurological: as above Psychiatric: No depression at this time.  No anxiety Endocrine: No palpitations, diaphoresis, change in appetite, change in weigh or increased thirst Hematologic/Lymphatic: No anemia, purpura, petechiae. Allergic/Immunologic: No itchy/runny eyes, nasal congestion, recent allergic reactions, rashes  ALLERGIES: Allergies  Allergen Reactions  . Azithromycin     QT WAVE    HOME MEDICATIONS:  Current Outpatient Medications:  .  acetaminophen (TYLENOL) 500 MG tablet, Take 1,000 mg by mouth every 6 (six) hours as needed for moderate pain., Disp: , Rfl:  .  allopurinol (ZYLOPRIM) 300 MG tablet, Take 300 mg by mouth daily. , Disp: , Rfl:  .  amLODipine (NORVASC) 5 MG tablet, TAKE ONE TABLET BY MOUTH DAILY (Patient taking differently: Take 5 mg by mouth daily.), Disp: 30 tablet, Rfl: 0 .  buPROPion (WELLBUTRIN XL) 150 MG 24 hr tablet, Take 150 mg by mouth daily., Disp: , Rfl:  .  Calcium Carb-Cholecalciferol (CALCIUM + D3 PO), Take 1 tablet by mouth daily at 3 pm. , Disp: , Rfl:  .  carvedilol (COREG) 25 MG tablet, TAKE ONE TABLET BY MOUTH  TWICE DAILY. TAKE WITH A MEAL. Please make yearly appt with Dr. Curt Bears for February 2022 for future refills. Thank you 1st attempt (Patient taking differently: Take 25 mg by mouth 2 (two) times daily with a meal. TAKE ONE TABLET BY MOUTH TWICE DAILY. TAKE WITH A MEAL. Please make yearly appt with Dr. Curt Bears for February 2022 for future refills. Thank you 1st attempt), Disp: 90 tablet, Rfl: 0 .  CINNAMON PO, Take 350 mg by mouth daily at 3 pm. , Disp: , Rfl:  .  Coenzyme Q10 200 MG capsule, Take 400 mg by mouth daily in the afternoon. , Disp: , Rfl:  .  dofetilide (TIKOSYN) 250 MCG capsule, TAKE ONE CAPSULE BY MOUTH TWICE DAILY (Patient taking differently: Take 250 mcg by mouth 2 (two) times daily.), Disp: 180 capsule, Rfl: 2 .  Ferrous Gluconate-C-Folic Acid (IRON-C PO), Take 1 tablet by mouth daily., Disp: , Rfl:  .  Glucosamine-Chondroitin (GLUCOSAMINE CHONDR COMPLEX PO), Take 1 tablet by mouth daily. , Disp: , Rfl:  .  LUTEIN-ZEAXANTHIN PO, Take 2 tablets by mouth daily in the afternoon. , Disp: , Rfl:  .  Multiple Vitamins-Minerals (MULTIVITAMIN PO), Take 1 tablet by mouth daily at 3 pm. , Disp: , Rfl:  .  olmesartan (BENICAR) 40 MG tablet, Take 1 tablet (40 mg total) by mouth daily., Disp: 15 tablet, Rfl: 0 .  Omega-3 Fatty Acids (OMEGA 3 PO), Take 1,350 mg by mouth daily., Disp: , Rfl:  .  PHOSPHATIDYLCHOLINE PO, Take 2 tablets by mouth daily in the afternoon. , Disp: , Rfl:  .  potassium chloride (KLOR-CON) 10 MEQ tablet, Taking two tablets in the morning, (Patient taking differently: Take 20 mEq by mouth daily.), Disp: , Rfl:  .  Probiotic Product (PROBIOTIC DAILY PO), Take 1 tablet by mouth daily in the afternoon., Disp: , Rfl:  .  RESVERATROL 100 MG CAPS, Take 100 mg by mouth daily at 3 pm. , Disp: , Rfl:  .  rivaroxaban (XARELTO) 20 MG TABS tablet, Take 1 tablet (20 mg total) by mouth daily with supper., Disp: 90 tablet, Rfl: 1 .  tamsulosin (FLOMAX) 0.4 MG CAPS capsule, Take 0.4 mg by  mouth daily. , Disp: , Rfl: 5  PAST MEDICAL HISTORY: Past Medical History:  Diagnosis Date  . Arthritis    "knuckles maybe" (02/28/2018)  . Atrial fib/flutter, transient    a. s/p TEE-guided DCCV on 12/14/2017 with return to [facility].   [name] BPH (benign prostatic hyperplasia) 12/11/2017  . CHF (congestive heart failure) ([place])    Echo in November 2020 with EF 45-50% mild LVH, global hypokinesis.  . Essential hypertension   . Gout    "I take RX qd" (02/28/2018)  . Neuropathy   . 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  [name] Pneumonia 11/2017  . Thyroid nodule    Benign    PAST SURGICAL HISTORY: Past Surgical History:  Procedure Laterality Date  . ATRIAL FIBRILLATION ABLATION N/A 04/04/2019   Procedure: ATRIAL FIBRILLATION ABLATION;  Surgeon: Constance Haw, MD;  Location: [facility];  Service: Cardiovascular;  Laterality: N/A;  . BACK SURGERY    . BIOPSY THYROID    . CARDIOVERSION N/A 12/14/2017   Procedure: CARDIOVERSION;  Surgeon: Satira Sark, MD;  Location: [facility];  Service: Cardiovascular;  Laterality: N/A;  . CARDIOVERSION N/A 01/14/2019   Procedure: CARDIOVERSION;  Surgeon: Constance Haw, MD;  Location: [facility];  Service: Cardiovascular;  Laterality: N/A;  . CATARACT EXTRACTION W/ INTRAOCULAR LENS  IMPLANT, BILATERAL Bilateral 2006-2010   "right-left"  . COLONOSCOPY  12/30/2010   [facility]; Dr. Anthony Sar; normal colonoscopy.  Repeat in 10 years.  . COLONOSCOPY WITH PROPOFOL N/A 08/01/2020   Procedure: COLONOSCOPY WITH PROPOFOL;  Surgeon: Daneil Dolin, MD;  [facility]: [facility];  Service: Endoscopy;  Laterality: N/A;  AM-pt requests as early as possible  . IRRIGATION AND DEBRIDEMENT SEBACEOUS CYST    . LUMBAR LAMINECTOMY N/A 01/20/2013   Procedure: MICRODISCECTOMY LUMBAR LAMINECTOMY;  Surgeon: Marybelle Killings, MD;  Location: [facility];  Service: Orthopedics;  Laterality: N/A;  L4-5 Decompression  . PILONIDAL CYST  DRAINAGE    . POLYPECTOMY  08/01/2020   Procedure: POLYPECTOMY INTESTINAL;  Surgeon: Daneil Dolin, MD;  [facility];  Service: Endoscopy;;  splenic flexure colon polyp;   . TEE WITHOUT CARDIOVERSION N/A 12/14/2017   Procedure: TRANSESOPHAGEAL ECHOCARDIOGRAM (TEE) WITH PROPOFOL;  Surgeon: Satira Sark, MD;  Location: [facility];  Service: Cardiovascular;  Laterality: N/A;    FAMILY HISTORY: Family History  Problem Relation Age of Onset  . Lung cancer Father   . Cancer - Lung Father   . Atrial fibrillation Mother   . Dementia Mother   . Transient ischemic attack Mother   . Colon polyps Mother   . Hypertension Other   . Liver cancer Brother   . Colon cancer Maternal Uncle        diagnosed in his late 48s or early 48s    SOCIAL HISTORY:  Social History   Socioeconomic History  . Marital status: Married    Spouse name: Not on file  . Number of children: 3  . Years of education: Not on  file  . Highest education level: Not on file  Occupational History  . Not on file  Tobacco Use  . Smoking status: Never Smoker  . Smokeless tobacco: Never Used  Vaping Use  . Vaping Use: Never used  Substance and Sexual Activity  . Alcohol use: Never    Alcohol/week: 0.0 standard drinks  . Drug use: Never  . Sexual activity: Not Currently  Other Topics Concern  . Not on file  Social History Narrative  . Not on file   Social Determinants of Health   Financial Resource Strain: Not on file  Food Insecurity: Not on file  Transportation Needs: Not on file  Physical Activity: Not on file  Stress: Not on file  Social Connections: Not on file  Intimate Partner Violence: Not on file     PHYSICAL EXAM  There were no vitals filed for this visit.  There is no height or weight on file to calculate BMI.   General: The patient is well-developed and well-nourished and in no acute distress  HEENT:  Head is [place]/AT.    Skin: Extremities are without rash or   edema.  Neurologic Exam  Mental status: The patient is alert and oriented x 3 at the time of the examination. The patient has apparent normal recent and remote memory, with an apparently normal attention span and concentration ability.   Speech is normal.  Cranial nerves: Extraocular movements are full.  Normal facial strength.  Motor:  Muscle bulk is normal.   Tone is normal. Strength is  5 / 5 in all 4 extremities.   Sensory: Sensory testing is intact to pinprick, soft touch and vibration sensation in the arms and proximal legs.  He has reduced pinprick sensation in the toes with a gradient towards normal just above the ankle.  He reports normal vibration sensation of the ankles but nearly absent vibration sensation at the toes, bilaterally.  Gait and station: Station is normal.   Gait is normal. Tandem gait is normal. Romberg is negative.   Reflexes: Deep tendon reflexes are symmetric and normal in arms, 1 at the knees and absent at the ankles.      DIAGNOSTIC DATA (LABS, IMAGING, TESTING) - I reviewed patient records, labs, notes, testing and imaging myself where available.  Lab Results  Component Value Date   WBC 5.7 07/30/2020   HGB 13.3 07/30/2020   HCT 41.2 07/30/2020   MCV 92.6 07/30/2020   PLT 116 (L) 07/30/2020      Component Value Date/Time   NA 138 07/30/2020 1401   NA 145 (H) 03/20/2019 1152   K 4.3 07/30/2020 1401   CL 107 07/30/2020 1401   CO2 23 07/30/2020 1401   GLUCOSE 94 07/30/2020 1401   BUN 20 07/30/2020 1401   BUN 15 03/20/2019 1152   CREATININE 0.85 07/30/2020 1401   CALCIUM 8.6 (L) 07/30/2020 1401   PROT 7.0 07/23/2020 1159   ALBUMIN 3.1 (L) 04/08/2019 0244   AST 22 04/08/2019 0244   ALT 23 04/08/2019 0244   ALKPHOS 45 04/08/2019 0244   BILITOT 1.3 (H) 04/08/2019 0244   GFRNONAA >60 07/30/2020 1401   GFRAA >60 06/30/2019 1348    Lab Results  Component Value Date   TSH 2.003 06/30/2019       ASSESSMENT AND PLAN  Polyneuropathy -  Plan: NCV with EMG(electromyography)  Numbness - Plan: NCV with EMG(electromyography)   1.  We had a conversation about the polyneuropathy.  It is moderate but could explain  why he sometimes feels off balance when he is elevated without clear line of sight.  He is advised to be careful in situations like this.  It is difficult to know whether symptoms will stabilize or progress..  Because the polyneuropathy is axonal and not demyelinating, it is unlikely to respond to any treatments.  He has no pain and I will hold off on treatments like gabapentin or a tricyclic as they are unlikely to affect the actual numbness. 2.   Stay active and exercise as tolerated. 3.   Return to see me as needed.  Richard A. Felecia Shelling, MD, [facility] 2/82/4175, 30:10 AM Certified in Neurology, Clinical Neurophysiology, Sleep Medicine and Neuroimaging  [facility] [address] [place], [place] [address] [phone]

## 2020-08-09 IMAGING — CR PORTABLE CHEST - 1 VIEW
1 series · 1 of 1 positions shown · non-contrast
Comparison: 07/10/2018

CLINICAL DATA: Palpitations and sob. Hx of afib

EXAM:
PORTABLE CHEST 1 VIEW

[portable]
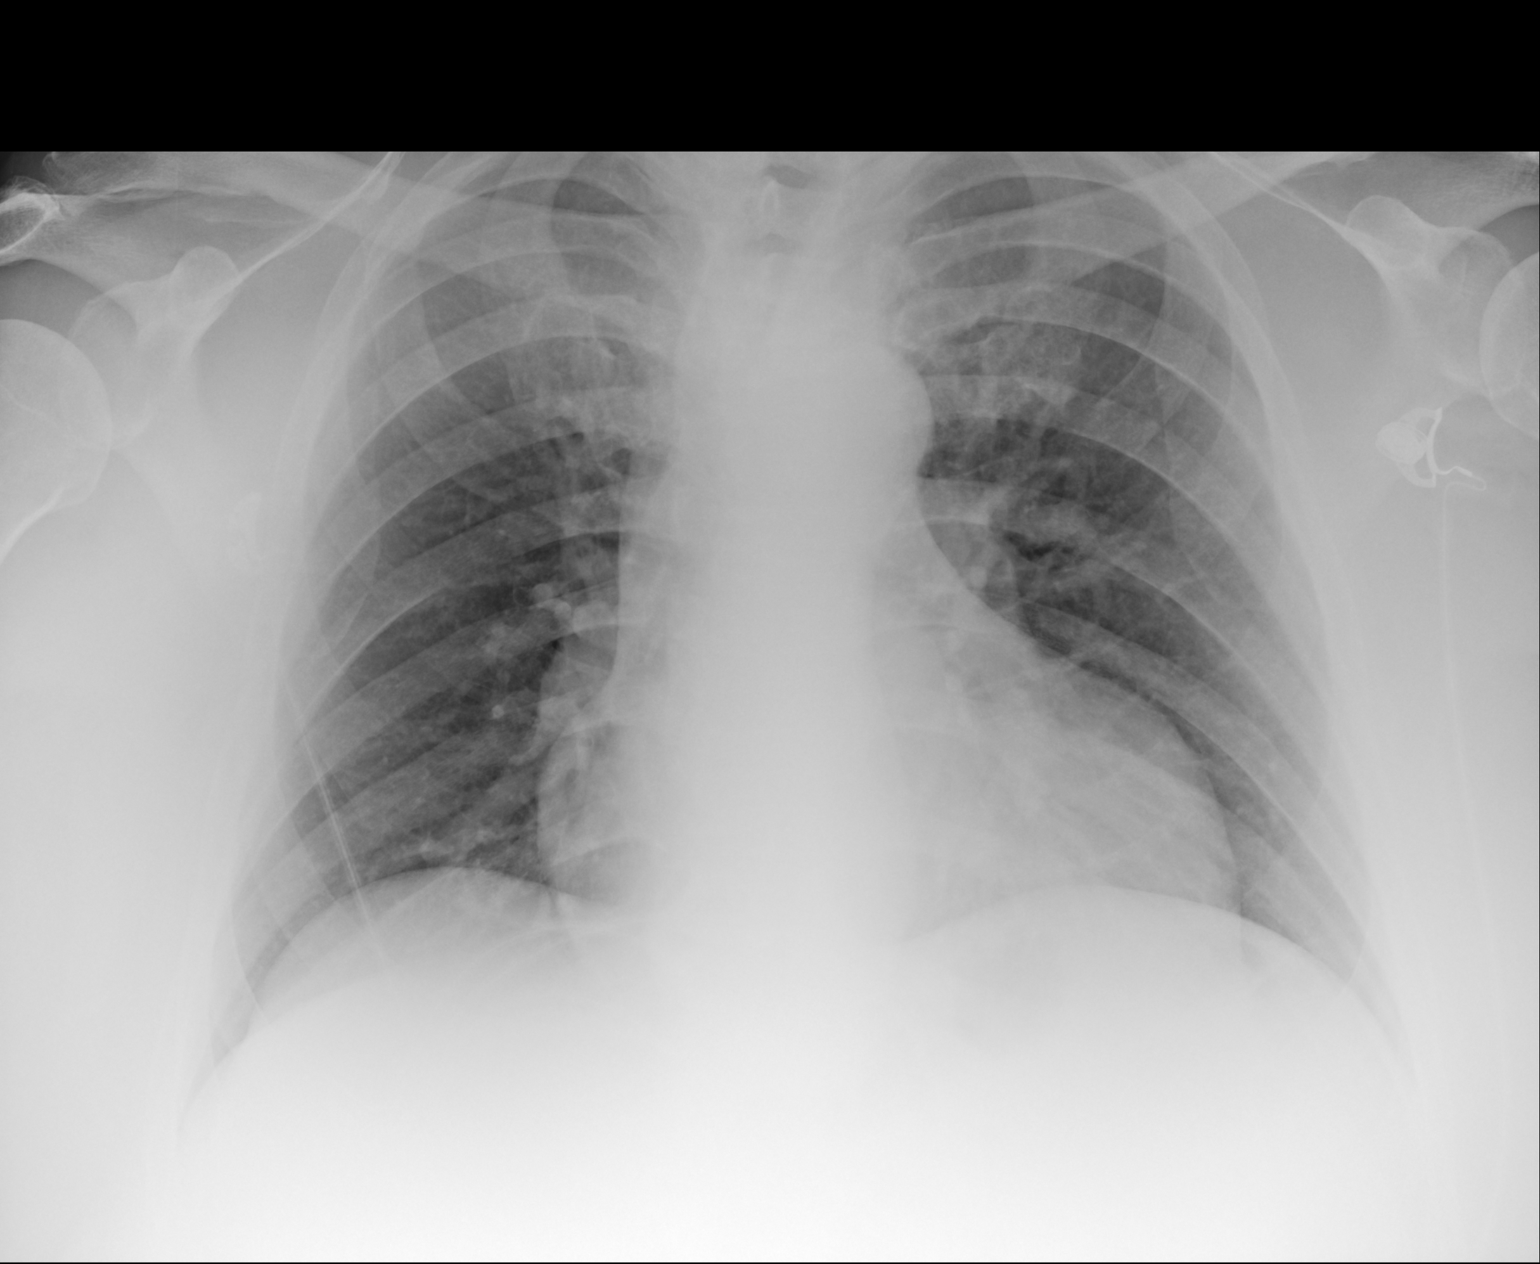

[1 of 1 positions shown; findings below may reference images not displayed]

FINDINGS: Heart is enlarged and stable in configuration. There is mild
pulmonary vascular congestion but no overt edema. No focal
consolidations or pleural effusions.
IMPRESSION: Stable cardiomegaly.

## 2020-08-19 ENCOUNTER — Ambulatory Visit (INDEPENDENT_AMBULATORY_CARE_PROVIDER_SITE_OTHER): Payer: Medicare Other | Admitting: Cardiology

## 2020-08-19 ENCOUNTER — Encounter: Payer: Self-pay | Admitting: Cardiology

## 2020-08-19 ENCOUNTER — Other Ambulatory Visit: Payer: Self-pay

## 2020-08-19 VITALS — BP 136/74 | HR 58 | Ht 74.0 in | Wt 263.0 lb

## 2020-08-19 DIAGNOSIS — I4819 Other persistent atrial fibrillation: Secondary | ICD-10-CM

## 2020-08-19 NOTE — Progress Notes (Signed)
Electrophysiology Office Note   Date:  08/19/2020   ID:  RA PFIESTER, DOB 1950-05-30, MRN [id number]  PCP:  Glenda Chroman, MD  Cardiologist:  Lovena Le Primary Electrophysiologist:  Delfino Friesen Meredith Leeds, MD    Chief Complaint: AF   History of Present Illness: [name] is a 70 y.o. male who is being seen today for the evaluation of AF at the request of [name]. Presenting today for electrophysiology evaluation.  He has a history of atrial fibrillation/flutter status post cardioversion, and hypertension.  He is also on Tikosyn.  He is now status post AF ablation 04/04/2019.  Despite pacing around the anterior wall of the right pulmonary veins, he developed phrenic nerve paralysis.    Today, denies symptoms of palpitations, chest pain, shortness of breath, orthopnea, PND, lower extremity edema, claudication, dizziness, presyncope, syncope, bleeding, or neurologic sequela. The patient is tolerating medications without difficulties.  He continues to have mild shortness of breath.  This is remained stable over the last few months.  He has noted intermittent short palpitations that last between 2 to 3 seconds.  Aside from that, he has no major complaints and is able to do all of his daily activities.  Past Medical History:  Diagnosis Date  . Arthritis    "knuckles maybe" (02/28/2018)  . Atrial fib/flutter, transient    a. s/p TEE-guided DCCV on 12/14/2017 with return to [facility].   [name] BPH (benign prostatic hyperplasia) 12/11/2017  . CHF (congestive heart failure) ([place])    Echo in November 2020 with EF 45-50% mild LVH, global hypokinesis.  . Essential hypertension   . Gout    "I take RX qd" (02/28/2018)  . Neuropathy   . 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  [name] Pneumonia 11/2017  . Thyroid nodule    Benign   Past Surgical History:  Procedure Laterality Date  . ATRIAL FIBRILLATION ABLATION N/A 04/04/2019   Procedure: ATRIAL FIBRILLATION  ABLATION;  Surgeon: Constance Haw, MD;  Location: [facility];  Service: Cardiovascular;  Laterality: N/A;  . BACK SURGERY    . BIOPSY THYROID    . CARDIOVERSION N/A 12/14/2017   Procedure: CARDIOVERSION;  Surgeon: Satira Sark, MD;  Location: [facility];  Service: Cardiovascular;  Laterality: N/A;  . CARDIOVERSION N/A 01/14/2019   Procedure: CARDIOVERSION;  Surgeon: Constance Haw, MD;  Location: [facility];  Service: Cardiovascular;  Laterality: N/A;  . CATARACT EXTRACTION W/ INTRAOCULAR LENS  IMPLANT, BILATERAL Bilateral 2006-2010   "right-left"  . COLONOSCOPY  12/30/2010   [facility]; Dr. Anthony Sar; normal colonoscopy.  Repeat in 10 years.  . COLONOSCOPY WITH PROPOFOL N/A 08/01/2020   Procedure: COLONOSCOPY WITH PROPOFOL;  Surgeon: Daneil Dolin, MD;  [facility]: [facility];  Service: Endoscopy;  Laterality: N/A;  AM-pt requests as early as possible  . IRRIGATION AND DEBRIDEMENT SEBACEOUS CYST    . LUMBAR LAMINECTOMY N/A 01/20/2013   Procedure: MICRODISCECTOMY LUMBAR LAMINECTOMY;  Surgeon: Marybelle Killings, MD;  Location: [facility];  Service: Orthopedics;  Laterality: N/A;  L4-5 Decompression  . PILONIDAL CYST DRAINAGE    . POLYPECTOMY  08/01/2020   Procedure: POLYPECTOMY INTESTINAL;  Surgeon: Daneil Dolin, MD;  [facility];  Service: Endoscopy;;  splenic flexure colon polyp;   . TEE WITHOUT CARDIOVERSION N/A 12/14/2017   Procedure: TRANSESOPHAGEAL ECHOCARDIOGRAM (TEE) WITH PROPOFOL;  Surgeon: Satira Sark, MD;  Location: [facility];  Service: Cardiovascular;  Laterality: N/A;  Current Outpatient Medications  Medication Sig Dispense Refill  . acetaminophen (TYLENOL) 500 MG tablet Take 1,000 mg by mouth every 6 (six) hours as needed for moderate pain.    [name] allopurinol (ZYLOPRIM) 300 MG tablet Take 300 mg by mouth daily.     [name] amLODipine (NORVASC) 5 MG tablet TAKE ONE TABLET BY MOUTH DAILY (Patient taking differently: Take 5 mg by mouth  daily.) 30 tablet 0  . buPROPion (WELLBUTRIN XL) 150 MG 24 hr tablet Take 150 mg by mouth daily.    . Calcium Carb-Cholecalciferol (CALCIUM + D3 PO) Take 1 tablet by mouth daily at 3 pm.     . carvedilol (COREG) 25 MG tablet TAKE ONE TABLET BY MOUTH TWICE DAILY. TAKE WITH A MEAL. Please make yearly appt with Dr. Curt Bears for February 2022 for future refills. Thank you 1st attempt (Patient taking differently: Take 25 mg by mouth 2 (two) times daily with a meal. TAKE ONE TABLET BY MOUTH TWICE DAILY. TAKE WITH A MEAL. Please make yearly appt with Dr. Curt Bears for February 2022 for future refills. Thank you 1st attempt) 90 tablet 0  . CINNAMON PO Take 350 mg by mouth daily at 3 pm.     . Coenzyme Q10 200 MG capsule Take 400 mg by mouth daily in the afternoon.     . dofetilide (TIKOSYN) 250 MCG capsule TAKE ONE CAPSULE BY MOUTH TWICE DAILY (Patient taking differently: Take 250 mcg by mouth 2 (two) times daily.) 180 capsule 2  . Ferrous Gluconate-C-Folic Acid (IRON-C PO) Take 1 tablet by mouth daily.    . Glucosamine-Chondroitin (GLUCOSAMINE CHONDR COMPLEX PO) Take 1 tablet by mouth daily.     . LUTEIN-ZEAXANTHIN PO Take 2 tablets by mouth daily in the afternoon.     . Multiple Vitamins-Minerals (MULTIVITAMIN PO) Take 1 tablet by mouth daily at 3 pm.     . olmesartan (BENICAR) 40 MG tablet Take 1 tablet (40 mg total) by mouth daily. 15 tablet 0  . Omega-3 Fatty Acids (OMEGA 3 PO) Take 1,350 mg by mouth daily.    [name] PHOSPHATIDYLCHOLINE PO Take 2 tablets by mouth daily in the afternoon.     . potassium chloride (KLOR-CON) 10 MEQ tablet Taking two tablets in the morning, (Patient taking differently: Take 20 mEq by mouth daily.)    . Probiotic Product (PROBIOTIC DAILY PO) Take 1 tablet by mouth daily in the afternoon.    [name] RESVERATROL 100 MG CAPS Take 100 mg by mouth daily at 3 pm.     . rivaroxaban (XARELTO) 20 MG TABS tablet Take 1 tablet (20 mg total) by mouth daily with supper. 90 tablet 1  . tamsulosin  (FLOMAX) 0.4 MG CAPS capsule Take 0.4 mg by mouth daily.   5   No current facility-administered medications for this visit.    Allergies:   Azithromycin   Social History:  The patient  reports that he has never smoked. He has never used smokeless tobacco. He reports that he does not drink alcohol and does not use drugs.   Family History:  The patient's family history includes Atrial fibrillation in his mother; Cancer - Lung in his father; Colon cancer in his maternal uncle; Colon polyps in his mother; Dementia in his mother; Hypertension in an other family member; Liver cancer in his brother; Lung cancer in his father; Transient ischemic attack in his mother.   ROS:  Please see the history of present illness.   Otherwise, review of systems is positive for  none.   All other systems are reviewed and negative.   PHYSICAL EXAM: VS:  BP 136/74   Pulse (!) 58   Ht 6\' 2"  (1.88 m)   Wt 263 lb (119.3 kg)   BMI 33.77 kg/m  , BMI Body mass index is 33.77 kg/m. GEN: Well nourished, well developed, in no acute distress  HEENT: normal  Neck: no JVD, carotid bruits, or masses Cardiac: RRR; no murmurs, rubs, or gallops,no edema  Respiratory:  clear to auscultation bilaterally, normal work of breathing GI: soft, nontender, nondistended, + BS MS: no deformity or atrophy  Skin: warm and dry Neuro:  Strength and sensation are intact Psych: euthymic mood, full affect  EKG:  EKG is ordered today. Personal review of the ekg ordered shows sinus rhythm  Recent Labs: 07/30/2020: BUN 20; Creatinine, Ser 0.85; Hemoglobin 13.3; Platelets 116; Potassium 4.3; Sodium 138    Lipid Panel  No results found for: CHOL, TRIG, HDL, CHOLHDL, VLDL, LDLCALC, LDLDIRECT   Wt Readings from Last 3 Encounters:  08/19/20 263 lb (119.3 kg)  07/30/20 265 lb (120.2 kg)  07/23/20 263 lb 8 oz (119.5 kg)      Other studies Reviewed: Additional studies/ records that were reviewed today include: TTE 12/13/18  Review of the  above records today demonstrates:  - Left ventricle: EF difficult to estimate due to presence of   atrial fibrillation and variable heart rate. Appears mildly   depressed. The cavity size was normal. Wall thickness was   increased in a pattern of mild LVH. Systolic function was mildly   reduced. The estimated ejection fraction was in the range of 45%   to 50%. Wall motion was normal; there were no regional wall   motion abnormalities. - Left atrium: The atrium was moderately dilated. - Pulmonary arteries: Systolic pressure was mildly increased. PA   peak pressure: 32 mm Hg (S).   ASSESSMENT AND PLAN:  1.  Persistent atrial fibrillation/flutter: Currently on dofetilide and Xarelto.  High risk medication monitoring.  Is post AF ablation 04/04/2019.  CHA2DS2-VASc 2.  He unfortunately had phrenic nerve palsy after ablation, which has remained stable.  He remains in sinus rhythm.  No changes.  2.  Hypertension: Currently well controlled  Current medicines are reviewed at length with the patient today.   The patient does not have concerns regarding his medicines.  The following changes were made today: None  Labs/ tests ordered today include:  Orders Placed This Encounter  Procedures  . EKG 12-Lead     Disposition:   FU with Ezmeralda Stefanick 6 versus NPI number online is taking the person's name months  Signed, Omair Dettmer Meredith Leeds, MD  08/19/2020 10:10 AM     [facility] [address] [place]  [address] [phone] (office) [phone] (fax)

## 2020-08-19 NOTE — Patient Instructions (Signed)
Medication Instructions:  Your physician recommends that you continue on your current medications as directed. Please refer to the Current Medication list given to you today.  *If you need a refill on your cardiac medications before your next appointment, please call your pharmacy*   Lab Work: None ordered If you have labs (blood work) drawn today and your tests are completely normal, you will receive your results only by: . MyChart Message (if you have MyChart) OR . A paper copy in the mail If you have any lab test that is abnormal or we need to change your treatment, we will call you to review the results.   Testing/Procedures: None ordered   Follow-Up: At [facility], you and your health needs are our priority.  As part of our continuing mission to provide you with exceptional heart care, we have created designated Provider Care Teams.  These Care Teams include your primary Cardiologist (physician) and Advanced Practice Providers (APPs -  Physician Assistants and Nurse Practitioners) who all work together to provide you with the care you need, when you need it.  We recommend signing up for the patient portal called "MyChart".  Sign up information is provided on this After Visit Summary.  MyChart is used to connect with patients for Virtual Visits (Telemedicine).  Patients are able to view lab/test results, encounter notes, upcoming appointments, etc.  Non-urgent messages can be sent to your provider as well.   To learn more about what you can do with MyChart, go to [email or web address].    Your next appointment:   6 month(s)  The format for your next appointment:   In Person  Provider:   Will Camnitz, MD   Thank you for choosing [facility]!!   Nailani Full, RN [phone]    Other Instructions    

## 2020-08-20 DIAGNOSIS — Z299 Encounter for prophylactic measures, unspecified: Secondary | ICD-10-CM | POA: Diagnosis not present

## 2020-08-20 DIAGNOSIS — I10 Essential (primary) hypertension: Secondary | ICD-10-CM | POA: Diagnosis not present

## 2020-08-20 DIAGNOSIS — Z789 Other specified health status: Secondary | ICD-10-CM | POA: Diagnosis not present

## 2020-08-20 DIAGNOSIS — J329 Chronic sinusitis, unspecified: Secondary | ICD-10-CM | POA: Diagnosis not present

## 2020-08-20 DIAGNOSIS — I4891 Unspecified atrial fibrillation: Secondary | ICD-10-CM | POA: Diagnosis not present

## 2020-08-20 DIAGNOSIS — I429 Cardiomyopathy, unspecified: Secondary | ICD-10-CM | POA: Diagnosis not present

## 2020-09-23 ENCOUNTER — Other Ambulatory Visit: Payer: Self-pay | Admitting: *Deleted

## 2020-09-23 MED ORDER — DOFETILIDE 250 MCG PO CAPS: 250.0000 ug | ORAL_CAPSULE | Freq: Two times a day (BID) | ORAL | 2 refills | Status: DC

## 2020-10-23 ENCOUNTER — Other Ambulatory Visit: Payer: Self-pay

## 2020-10-23 MED ORDER — RIVAROXABAN 20 MG PO TABS: 20.0000 mg | ORAL_TABLET | Freq: Every day | ORAL | 1 refills | Status: DC

## 2020-10-23 NOTE — Addendum Note (Signed)
Addended by: Brynda Peon on: 10/23/2020 02:38 PM   Modules accepted: Orders

## 2020-10-23 NOTE — Telephone Encounter (Signed)
Received fax from [facility] requesting refill on pt's Xarelto 20mg  QD rx.  Pt last saw Dr Curt Bears 08/19/20, last labs 07/30/20 Creat 0.85, age 70, weight 119.3kg, CrCl 134.5, based on CrCl pt is on appropriate dosage of Xarelto 20mg  QD for afib.  Will refill rx.

## 2020-10-24 DIAGNOSIS — I429 Cardiomyopathy, unspecified: Secondary | ICD-10-CM | POA: Diagnosis not present

## 2020-10-24 DIAGNOSIS — I10 Essential (primary) hypertension: Secondary | ICD-10-CM | POA: Diagnosis not present

## 2020-10-24 DIAGNOSIS — I4891 Unspecified atrial fibrillation: Secondary | ICD-10-CM | POA: Diagnosis not present

## 2020-10-24 DIAGNOSIS — D6869 Other thrombophilia: Secondary | ICD-10-CM | POA: Diagnosis not present

## 2020-10-24 DIAGNOSIS — Z299 Encounter for prophylactic measures, unspecified: Secondary | ICD-10-CM | POA: Diagnosis not present

## 2020-11-01 IMAGING — CT CT CHEST W/ CM
2 of 3 series · 15 of 36 positions shown, 18 images · IV contrast (APPLIED)
Comparison: March 30, 2019

CLINICAL DATA: Shortness of breath

EXAM:
CT CHEST WITH CONTRAST
TECHNIQUE: Multidetector CT imaging of the chest was performed during
intravenous contrast administration.
CONTRAST:  75mL OMNIPAQUE IOHEXOL 300 MG/ML  SOLN

[Series 3: thorax 2.0 i31f 2 · axial · 0.89mm/px · z∈[+1241,+1477]mm · 12 of 140 slices shown, 15 images]
[im 11/140  mediastinal]
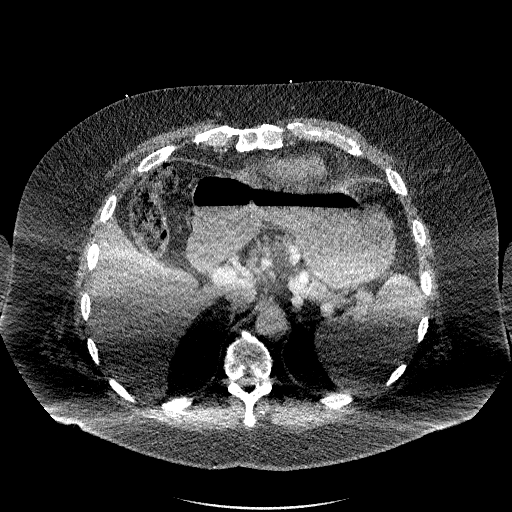
[im 11/140  lung]
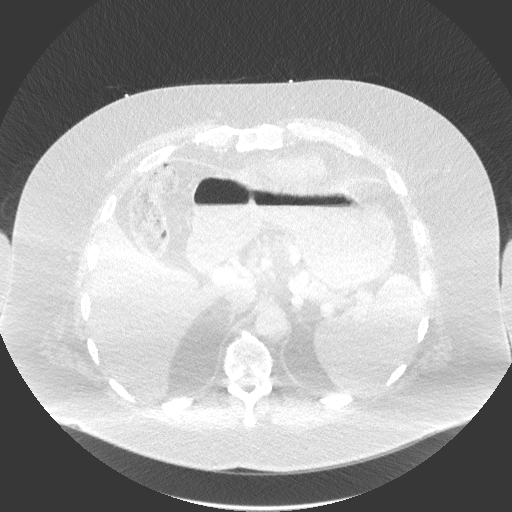
[im 21/140  lung]
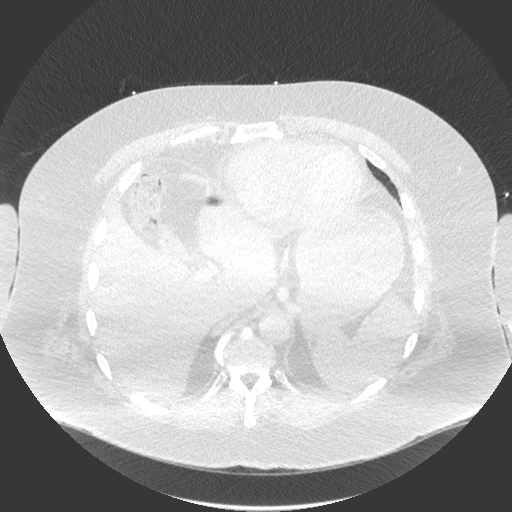
[im 31/140  lung]
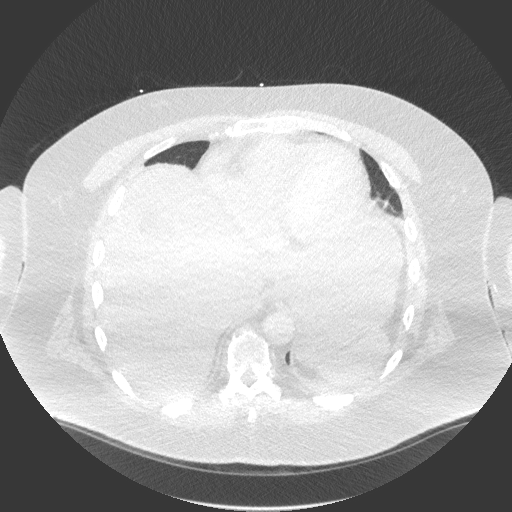
[im 42/140  lung]
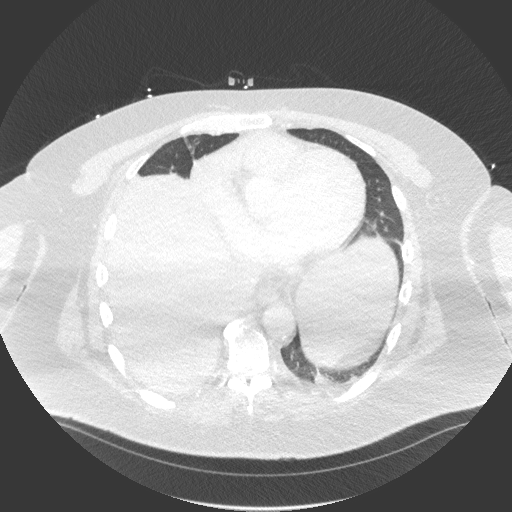
[im 52/140  mediastinal]
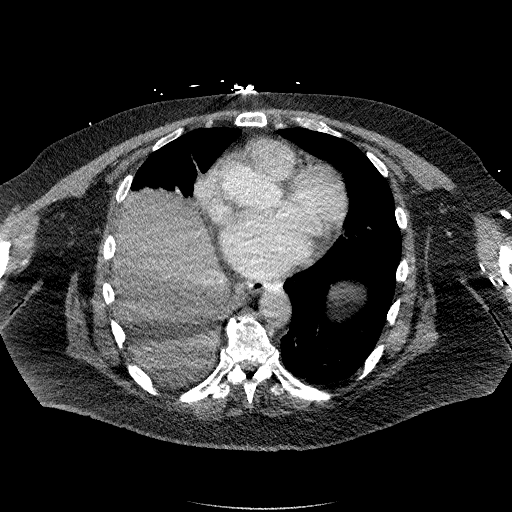
[im 52/140  lung]
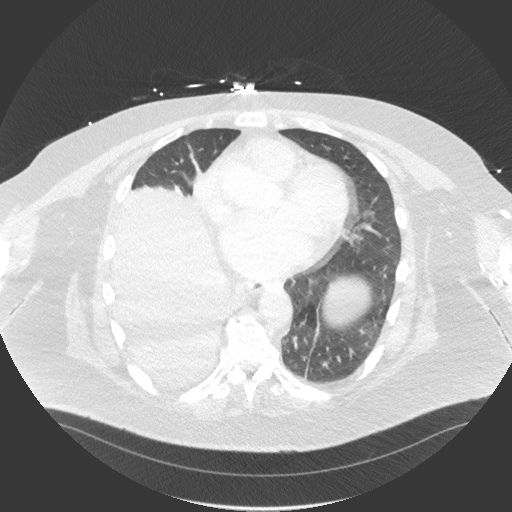
[im 62/140  lung]
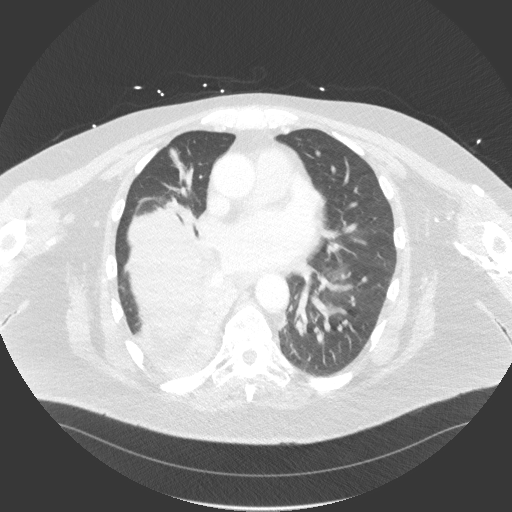
[im 78/140  lung]
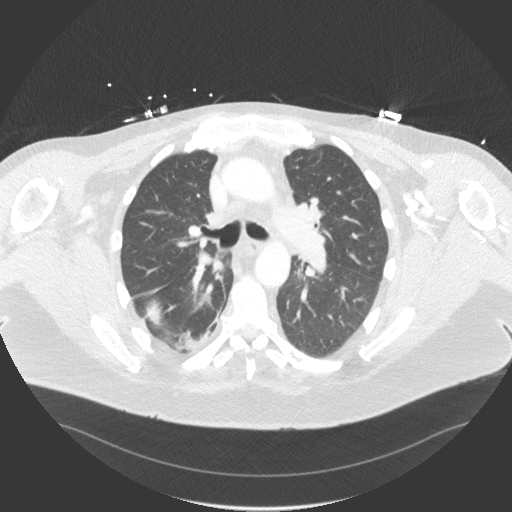
[im 88/140  lung]
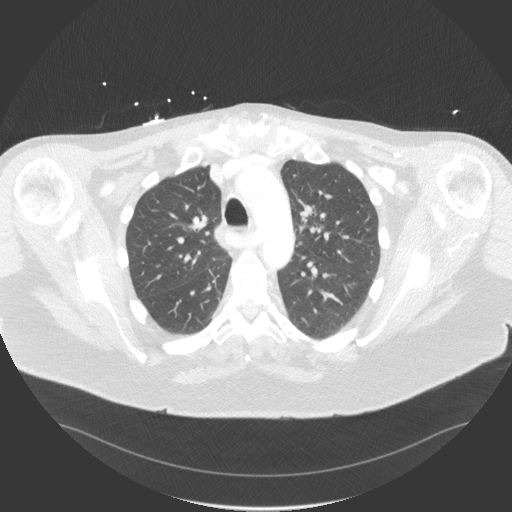
[im 98/140  mediastinal]
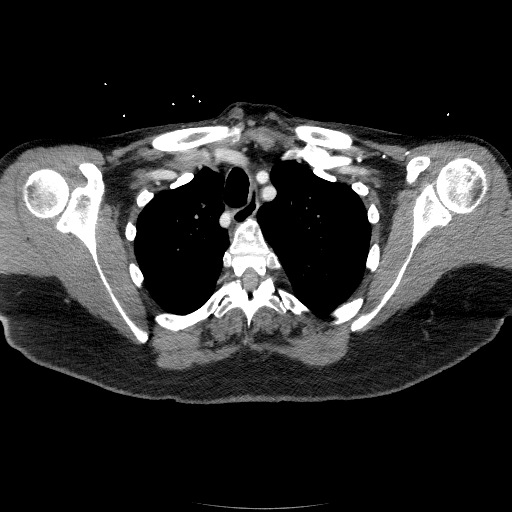
[im 98/140  lung]
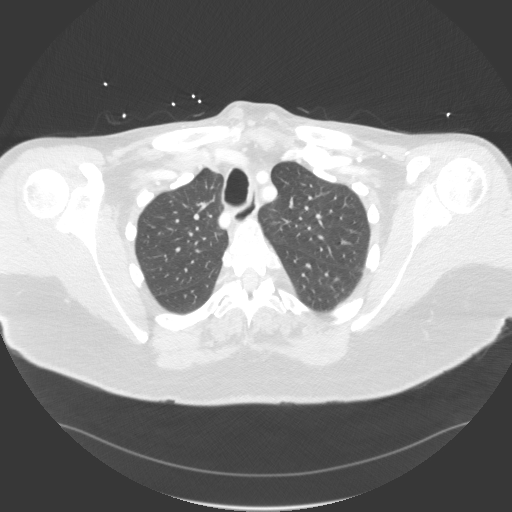
[im 109/140  lung]
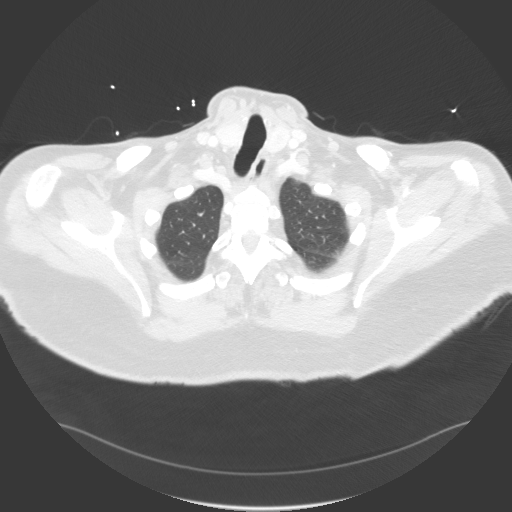
[im 119/140  lung]
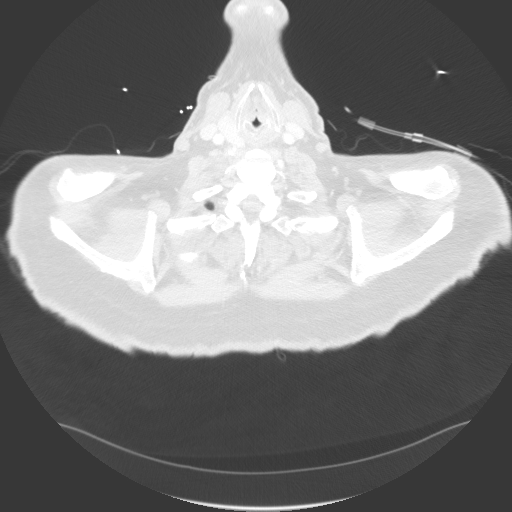
[im 129/140  lung]
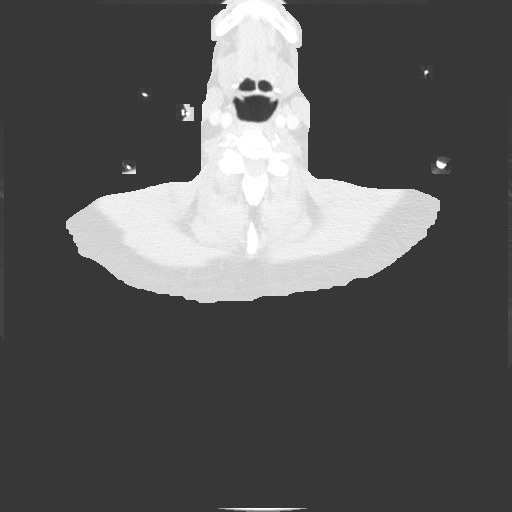

[Series 5: coronal · coronal · 0.60mm/px · 3 of 173 slices shown]
[im 35/173  lung]
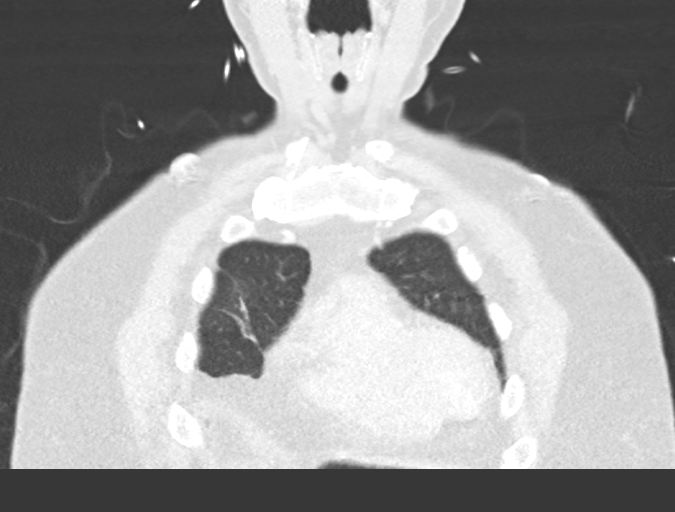
[im 69/173  lung]
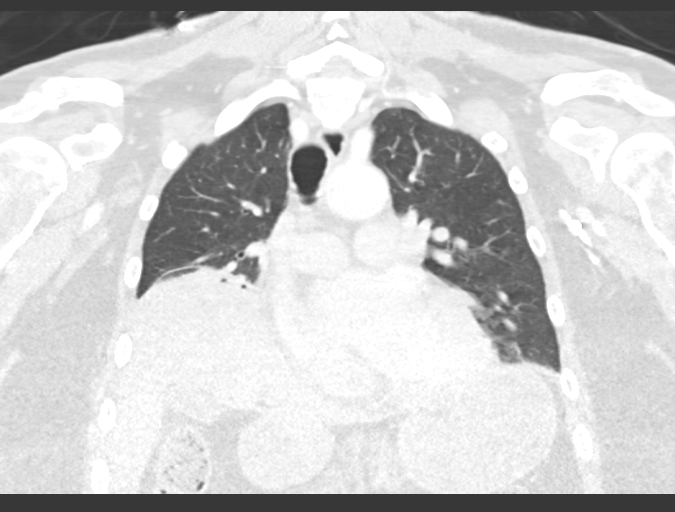
[im 104/173  lung]
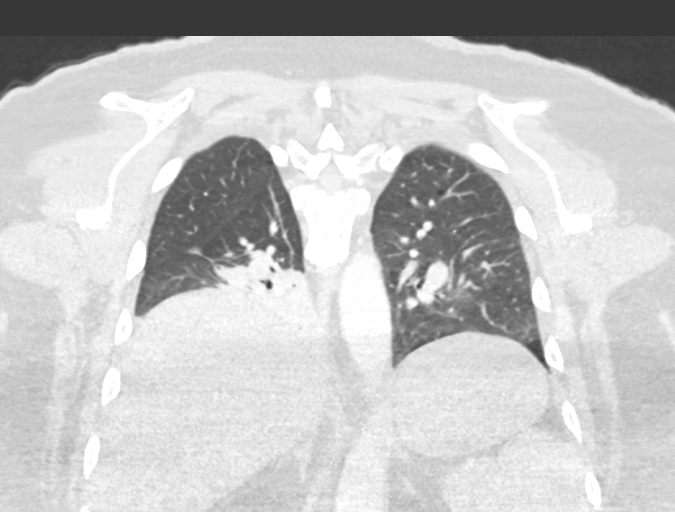

[15 of 36 positions shown; findings below may reference images not displayed]

FINDINGS: Cardiovascular: The heart size is mildly enlarged. There is
significant enlargement of the left atrium. Detection of pulmonary
emboli cannot be performed on this exam secondary to contrast
timing. There is no evidence for an aortic dissection. There is an
aberrant right subclavian artery, a normal variant. There is no
significant pericardial effusion.

Mediastinum/Nodes:

--No mediastinal or hilar lymphadenopathy.

--No axillary lymphadenopathy.

--No supraclavicular lymphadenopathy.

--there is a dominant left-sided thyroid nodule measuring
approximately 2.1 cm. This was previously evaluated on multiple
prior thyroid ultrasounds.

--The esophagus is unremarkable

Lungs/Pleura: There is partial collapse of the right lower lobe.
There is significant atelectasis in volume loss involving the right
middle lobe. There is no obvious endoluminal filling defect within
the right lower lobe bronchus.

Upper Abdomen: No acute abnormality.

Musculoskeletal: No chest wall abnormality. No acute or significant
osseous findings.

Review of the MIP images confirms the above findings.
IMPRESSION: 1. There is partial collapse of the right lower lobe and right
middle lobe. There is no obvious endoluminal filling defect within
the right lower lobe bronchus.
2. There is an aberrant right subclavian artery, a normal variant.
3. Mild cardiac enlargement with significant left atrial
enlargement.

## 2020-11-21 DIAGNOSIS — I4891 Unspecified atrial fibrillation: Secondary | ICD-10-CM | POA: Diagnosis not present

## 2020-11-21 DIAGNOSIS — F32A Depression, unspecified: Secondary | ICD-10-CM | POA: Diagnosis not present

## 2020-11-21 DIAGNOSIS — I429 Cardiomyopathy, unspecified: Secondary | ICD-10-CM | POA: Diagnosis not present

## 2020-11-21 DIAGNOSIS — I10 Essential (primary) hypertension: Secondary | ICD-10-CM | POA: Diagnosis not present

## 2021-01-08 DIAGNOSIS — I10 Essential (primary) hypertension: Secondary | ICD-10-CM | POA: Diagnosis not present

## 2021-01-08 DIAGNOSIS — I4891 Unspecified atrial fibrillation: Secondary | ICD-10-CM | POA: Diagnosis not present

## 2021-01-08 DIAGNOSIS — B370 Candidal stomatitis: Secondary | ICD-10-CM | POA: Diagnosis not present

## 2021-01-08 DIAGNOSIS — F339 Major depressive disorder, recurrent, unspecified: Secondary | ICD-10-CM | POA: Diagnosis not present

## 2021-01-08 DIAGNOSIS — Z299 Encounter for prophylactic measures, unspecified: Secondary | ICD-10-CM | POA: Diagnosis not present

## 2021-02-11 ENCOUNTER — Other Ambulatory Visit: Payer: Self-pay

## 2021-02-11 ENCOUNTER — Encounter: Payer: Self-pay | Admitting: Cardiology

## 2021-02-11 ENCOUNTER — Ambulatory Visit (INDEPENDENT_AMBULATORY_CARE_PROVIDER_SITE_OTHER): Payer: Medicare Other | Admitting: Cardiology

## 2021-02-11 VITALS — BP 120/82 | HR 54 | Ht 74.0 in | Wt 268.0 lb

## 2021-02-11 DIAGNOSIS — Z79899 Other long term (current) drug therapy: Secondary | ICD-10-CM | POA: Diagnosis not present

## 2021-02-11 DIAGNOSIS — I4819 Other persistent atrial fibrillation: Secondary | ICD-10-CM | POA: Diagnosis not present

## 2021-02-11 LAB — CBC
Hematocrit: 37.2 % — ABNORMAL LOW (ref 37.5–51.0)
Hemoglobin: 12.6 g/dL — ABNORMAL LOW (ref 13.0–17.7)
MCH: 29.9 pg (ref 26.6–33.0)
MCHC: 33.9 g/dL (ref 31.5–35.7)
MCV: 88 fL (ref 79–97)
Platelets: 111 10*3/uL — ABNORMAL LOW (ref 150–450)
RBC: 4.22 x10E6/uL (ref 4.14–5.80)
RDW: 12.2 % (ref 11.6–15.4)
WBC: 5.1 10*3/uL (ref 3.4–10.8)

## 2021-02-11 LAB — BASIC METABOLIC PANEL
BUN/Creatinine Ratio: 18 (ref 10–24)
BUN: 17 mg/dL (ref 8–27)
CO2: 26 mmol/L (ref 20–29)
Calcium: 8.6 mg/dL (ref 8.6–10.2)
Chloride: 106 mmol/L (ref 96–106)
Creatinine, Ser: 0.92 mg/dL (ref 0.76–1.27)
Glucose: 102 mg/dL — ABNORMAL HIGH (ref 65–99)
Potassium: 4.9 mmol/L (ref 3.5–5.2)
Sodium: 142 mmol/L (ref 134–144)
eGFR: 89 mL/min/{1.73_m2} (ref >59)

## 2021-02-11 LAB — MAGNESIUM: Magnesium: 2.2 mg/dL (ref 1.6–2.3)

## 2021-02-11 NOTE — Patient Instructions (Signed)
Medication Instructions:  Your physician recommends that you continue on your current medications as directed. Please refer to the Current Medication list given to you today.  *If you need a refill on your cardiac medications before your next appointment, please call your pharmacy*   Lab Work: Today: BMET, CBC & Magnesium  If you have labs (blood work) drawn today and your tests are completely normal, you will receive your results only by: [place] (if you have MyChart) OR A paper copy in the mail If you have any lab test that is abnormal or we need to change your treatment, we will call you to review the results.   Testing/Procedures: None ordered   Follow-Up: At [facility], you and your health needs are our priority.  As part of our continuing mission to provide you with exceptional heart care, we have created designated Provider Care Teams.  These Care Teams include your primary Cardiologist (physician) and Advanced Practice Providers (APPs -  Physician Assistants and Nurse Practitioners) who all work together to provide you with the care you need, when you need it.  Your next appointment:   6 month(s)  The format for your next appointment:   In Person  Provider:   Allegra Lai, MD    Thank you for choosing [facility]!!   Trinidad Curet, RN [phone]

## 2021-02-11 NOTE — Progress Notes (Signed)
Electrophysiology Office Note   Date:  02/11/2021   ID:  [name], DOB [birth year], MRN [id number]  PCP:  Glenda Chroman, MD  Cardiologist:  Lovena Le Primary Electrophysiologist:  Phaedra Colgate Meredith Leeds, MD    Chief Complaint: AF   History of Present Illness: [name] is a 70 y.o. male who is being seen today for the evaluation of AF at the request of [name]. Presenting today for electrophysiology evaluation.  He has a history of atrial fibrillation/flutter status post cardioversion, and hypertension.  He is on dofetilide patient is status post ablation 04/04/2019.  Despite pacing around the anterior wall of the right pulmonary veins, he developed phrenic nerve paralysis.  Today, denies symptoms of palpitations, chest pain, shortness of breath, orthopnea, PND, lower extremity edema, claudication, dizziness, presyncope, syncope, bleeding, or neurologic sequela. The patient is tolerating medications without difficulties.  Since being seen he has done well.  He has had no chest pain or shortness of breath.  Is able do all of his daily activities.  He is unaware of any further episodes of atrial fibrillation.  He is overall happy with his control.  He is interested in potentially stopping his dofetilide.  He does understand that he would need to come back into the hospital if he were to need to restart it.  Past Medical History:  Diagnosis Date   Arthritis    "knuckles maybe" (02/28/2018)   Atrial fib/flutter, transient    a. s/p TEE-guided DCCV on 12/14/2017 with return to [facility].    BPH (benign prostatic hyperplasia) 12/11/2017   CHF (congestive heart failure) ([place])    Echo in November 2020 with EF 45-50% mild LVH, global hypokinesis.   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   Pneumonia 11/2017   Thyroid nodule    Benign   Past Surgical History:  Procedure  Laterality Date   ATRIAL FIBRILLATION ABLATION N/A 04/04/2019   Procedure: ATRIAL FIBRILLATION ABLATION;  Surgeon: Constance Haw, MD;  Location: [facility];  Service: Cardiovascular;  Laterality: N/A;   BACK SURGERY     BIOPSY THYROID     CARDIOVERSION N/A 12/14/2017   Procedure: CARDIOVERSION;  Surgeon: Satira Sark, MD;  Location: [facility];  Service: Cardiovascular;  Laterality: N/A;   CARDIOVERSION N/A 01/14/2019   Procedure: CARDIOVERSION;  Surgeon: Constance Haw, MD;  Location: [facility];  Service: Cardiovascular;  Laterality: N/A;   CATARACT EXTRACTION W/ INTRAOCULAR LENS  IMPLANT, BILATERAL Bilateral 2006-2010   "right-left"   COLONOSCOPY  12/30/2010   [facility]; Dr. Anthony Sar; normal colonoscopy.  Repeat in 10 years.   COLONOSCOPY WITH PROPOFOL N/A 08/01/2020   Procedure: COLONOSCOPY WITH PROPOFOL;  Surgeon: Daneil Dolin, MD;  Location: [facility];  Service: Endoscopy;  Laterality: N/A;  AM-pt requests as early as possible   IRRIGATION AND DEBRIDEMENT SEBACEOUS CYST     LUMBAR LAMINECTOMY N/A 01/20/2013   Procedure: MICRODISCECTOMY LUMBAR LAMINECTOMY;  Surgeon: Marybelle Killings, MD;  Location: [place];  Service: Orthopedics;  Laterality: N/A;  L4-5 Decompression   PILONIDAL CYST DRAINAGE     POLYPECTOMY  08/01/2020   Procedure: POLYPECTOMY INTESTINAL;  Surgeon: Daneil Dolin, MD;  Location[facility];  Service: Endoscopy;;  splenic flexure colon polyp;    TEE WITHOUT CARDIOVERSION N/A 12/14/2017   Procedure: TRANSESOPHAGEAL  ECHOCARDIOGRAM (TEE) WITH PROPOFOL;  Surgeon: Satira Sark, MD;  Location: [facility];  Service: Cardiovascular;  Laterality: N/A;     Current Outpatient Medications  Medication Sig Dispense Refill   acetaminophen (TYLENOL) 500 MG tablet Take 1,000 mg by mouth every 6 (six) hours as needed for moderate pain.     allopurinol (ZYLOPRIM) 300 MG tablet Take 300 mg by mouth daily.      amLODipine (NORVASC) 5 MG tablet  TAKE ONE TABLET BY MOUTH DAILY (Patient taking differently: Take 5 mg by mouth daily.) 30 tablet 0   buPROPion (WELLBUTRIN XL) 150 MG 24 hr tablet Take 150 mg by mouth daily.     Calcium Carb-Cholecalciferol (CALCIUM + D3 PO) Take 1 tablet by mouth daily at 3 pm.      carvedilol (COREG) 25 MG tablet TAKE ONE TABLET BY MOUTH TWICE DAILY. TAKE WITH A MEAL. Please make yearly appt with Dr. Curt Bears for February 2022 for future refills. Thank you 1st attempt (Patient taking differently: Take 25 mg by mouth 2 (two) times daily with a meal. TAKE ONE TABLET BY MOUTH TWICE DAILY. TAKE WITH A MEAL. Please make yearly appt with Dr. Curt Bears for February 2022 for future refills. Thank you 1st attempt) 90 tablet 0   CINNAMON PO Take 350 mg by mouth daily at 3 pm.      Coenzyme Q10 200 MG capsule Take 400 mg by mouth daily in the afternoon.      dofetilide (TIKOSYN) 250 MCG capsule Take 1 capsule (250 mcg total) by mouth 2 (two) times daily. 180 capsule 2   Ferrous Gluconate-C-Folic Acid (IRON-C PO) Take 1 tablet by mouth daily.     Glucosamine-Chondroitin (GLUCOSAMINE CHONDR COMPLEX PO) Take 1 tablet by mouth daily.      LUTEIN-ZEAXANTHIN PO Take 2 tablets by mouth daily in the afternoon.      Multiple Vitamins-Minerals (MULTIVITAMIN PO) Take 1 tablet by mouth daily at 3 pm.      olmesartan (BENICAR) 40 MG tablet Take 1 tablet (40 mg total) by mouth daily. 15 tablet 0   Omega-3 Fatty Acids (OMEGA 3 PO) Take 1,350 mg by mouth daily.     PHOSPHATIDYLCHOLINE PO Take 2 tablets by mouth daily in the afternoon.      potassium chloride (KLOR-CON) 10 MEQ tablet Taking two tablets in the morning, (Patient taking differently: Take 20 mEq by mouth daily.)     Probiotic Product (PROBIOTIC DAILY PO) Take 1 tablet by mouth daily in the afternoon.     RESVERATROL 100 MG CAPS Take 100 mg by mouth daily at 3 pm.      rivaroxaban (XARELTO) 20 MG TABS tablet Take 1 tablet (20 mg total) by mouth daily with supper. 90 tablet 1    tamsulosin (FLOMAX) 0.4 MG CAPS capsule Take 0.4 mg by mouth daily.   5   No current facility-administered medications for this visit.    Allergies:   Azithromycin   Social History:  The patient  reports that he has never smoked. He has never used smokeless tobacco. He reports that he does not drink alcohol and does not use drugs.   Family History:  The patient's family history includes Atrial fibrillation in his mother; Cancer - Lung in his father; Colon cancer in his maternal uncle; Colon polyps in his mother; Dementia in his mother; Hypertension in an other family member; Liver cancer in his brother; Lung cancer in his father; Transient ischemic attack in his mother.   ROS:  Please see the history of present illness.   Otherwise, review of systems is positive for none.   All other systems are reviewed and negative.   PHYSICAL EXAM: VS:  BP 120/82   Pulse (!) 54   Ht 6\' 2"  (1.88 m)   Wt 268 lb (121.6 kg)   SpO2 98%   BMI 34.41 kg/m  , BMI Body mass index is 34.41 kg/m. GEN: Well nourished, well developed, in no acute distress  HEENT: normal  Neck: no JVD, carotid bruits, or masses Cardiac: RRR; no murmurs, rubs, or gallops,no edema  Respiratory:  clear to auscultation bilaterally, normal work of breathing GI: soft, nontender, nondistended, + BS MS: no deformity or atrophy  Skin: warm and dry Neuro:  Strength and sensation are intact Psych: euthymic mood, full affect  EKG:  EKG is ordered today. Personal review of the ekg ordered shows sinus rhythm, rate 54  Recent Labs: 07/30/2020: BUN 20; Creatinine, Ser 0.85; Hemoglobin 13.3; Platelets 116; Potassium 4.3; Sodium 138    Lipid Panel  No results found for: CHOL, TRIG, HDL, CHOLHDL, VLDL, LDLCALC, LDLDIRECT   Wt Readings from Last 3 Encounters:  02/11/21 268 lb (121.6 kg)  08/19/20 263 lb (119.3 kg)  07/30/20 265 lb (120.2 kg)      Other studies Reviewed: Additional studies/ records that were reviewed today include:  TTE 12/13/18  Review of the above records today demonstrates:  - Left ventricle: EF difficult to estimate due to presence of   atrial fibrillation and variable heart rate. Appears mildly   depressed. The cavity size was normal. Wall thickness was   increased in a pattern of mild LVH. Systolic function was mildly   reduced. The estimated ejection fraction was in the range of 45%   to 50%. Wall motion was normal; there were no regional wall   motion abnormalities. - Left atrium: The atrium was moderately dilated. - Pulmonary arteries: Systolic pressure was mildly increased. PA   peak pressure: 32 mm Hg (S).   ASSESSMENT AND PLAN:  1.  Persistent atrial fibrillation/flutter: Currently on dofetilide 250 mcg twice daily, Xarelto 20 mg daily.    CHA2DS2-VASc of 2.  He had phrenic nerve palsy after ablation which is remained stable.  He has remained in sinus rhythm.  He has had no further issues with his atrial fibrillation.  As his potassium has been normal in the past, we Ashleigh Luckow stop it today.  He is potentially interested in stopping his dofetilide.  He Bryson Palen discuss this further with his family. Dellia Donnelly check labs and ECG today for dofetilide monitoring.  2.  Hypertension: Currently well controlled  Current medicines are reviewed at length with the patient today.   The patient does not have concerns regarding his medicines.  The following changes were made today: Stop potassium  Labs/ tests ordered today include:  Orders Placed This Encounter  Procedures   Basic metabolic panel   CBC   Magnesium   EKG 12-Lead      Disposition:   FU with Shaletha Humble 6 months  Signed, Sana Tessmer Meredith Leeds, MD  02/11/2021 10:58 AM     [facility] [address] [place] [address] [phone] (office) [phone] (fax)

## 2021-02-18 DIAGNOSIS — D6869 Other thrombophilia: Secondary | ICD-10-CM | POA: Diagnosis not present

## 2021-02-18 DIAGNOSIS — Z23 Encounter for immunization: Secondary | ICD-10-CM | POA: Diagnosis not present

## 2021-02-18 DIAGNOSIS — Z299 Encounter for prophylactic measures, unspecified: Secondary | ICD-10-CM | POA: Diagnosis not present

## 2021-02-18 DIAGNOSIS — I10 Essential (primary) hypertension: Secondary | ICD-10-CM | POA: Diagnosis not present

## 2021-02-18 DIAGNOSIS — I4891 Unspecified atrial fibrillation: Secondary | ICD-10-CM | POA: Diagnosis not present

## 2021-02-18 DIAGNOSIS — K219 Gastro-esophageal reflux disease without esophagitis: Secondary | ICD-10-CM | POA: Diagnosis not present

## 2021-02-21 DIAGNOSIS — I10 Essential (primary) hypertension: Secondary | ICD-10-CM | POA: Diagnosis not present

## 2021-02-21 DIAGNOSIS — E7800 Pure hypercholesterolemia, unspecified: Secondary | ICD-10-CM | POA: Diagnosis not present

## 2021-03-13 ENCOUNTER — Other Ambulatory Visit: Payer: Self-pay | Admitting: *Deleted

## 2021-03-13 MED ORDER — DOFETILIDE 250 MCG PO CAPS: 250.0000 ug | ORAL_CAPSULE | Freq: Two times a day (BID) | ORAL | 2 refills | Status: DC

## 2021-03-24 DIAGNOSIS — I4891 Unspecified atrial fibrillation: Secondary | ICD-10-CM | POA: Diagnosis not present

## 2021-03-24 DIAGNOSIS — I429 Cardiomyopathy, unspecified: Secondary | ICD-10-CM | POA: Diagnosis not present

## 2021-03-24 DIAGNOSIS — F32A Depression, unspecified: Secondary | ICD-10-CM | POA: Diagnosis not present

## 2021-03-24 DIAGNOSIS — I10 Essential (primary) hypertension: Secondary | ICD-10-CM | POA: Diagnosis not present

## 2021-05-07 DIAGNOSIS — Z79899 Other long term (current) drug therapy: Secondary | ICD-10-CM | POA: Diagnosis not present

## 2021-05-07 DIAGNOSIS — Z789 Other specified health status: Secondary | ICD-10-CM | POA: Diagnosis not present

## 2021-05-07 DIAGNOSIS — Z299 Encounter for prophylactic measures, unspecified: Secondary | ICD-10-CM | POA: Diagnosis not present

## 2021-05-07 DIAGNOSIS — R5383 Other fatigue: Secondary | ICD-10-CM | POA: Diagnosis not present

## 2021-05-07 DIAGNOSIS — E7800 Pure hypercholesterolemia, unspecified: Secondary | ICD-10-CM | POA: Diagnosis not present

## 2021-05-07 DIAGNOSIS — I10 Essential (primary) hypertension: Secondary | ICD-10-CM | POA: Diagnosis not present

## 2021-05-07 DIAGNOSIS — Z7189 Other specified counseling: Secondary | ICD-10-CM | POA: Diagnosis not present

## 2021-05-07 DIAGNOSIS — Z125 Encounter for screening for malignant neoplasm of prostate: Secondary | ICD-10-CM | POA: Diagnosis not present

## 2021-05-07 DIAGNOSIS — Z1339 Encounter for screening examination for other mental health and behavioral disorders: Secondary | ICD-10-CM | POA: Diagnosis not present

## 2021-05-07 DIAGNOSIS — Z1331 Encounter for screening for depression: Secondary | ICD-10-CM | POA: Diagnosis not present

## 2021-05-07 DIAGNOSIS — Z6835 Body mass index (BMI) 35.0-35.9, adult: Secondary | ICD-10-CM | POA: Diagnosis not present

## 2021-05-07 DIAGNOSIS — Z0000 Encounter for general adult medical examination without abnormal findings: Secondary | ICD-10-CM | POA: Diagnosis not present

## 2021-05-21 DIAGNOSIS — H43393 Other vitreous opacities, bilateral: Secondary | ICD-10-CM | POA: Diagnosis not present

## 2021-07-08 ENCOUNTER — Emergency Department (HOSPITAL_COMMUNITY): Payer: Medicare Other

## 2021-07-08 ENCOUNTER — Emergency Department (HOSPITAL_COMMUNITY)
Admission: EM | Admit: 2021-07-08 | Discharge: 2021-07-08 | Disposition: A | Discharge status: Home / Self Care | Payer: Medicare Other | Attending: Emergency Medicine | Admitting: Emergency Medicine

## 2021-07-08 ENCOUNTER — Encounter (HOSPITAL_COMMUNITY): Payer: Self-pay

## 2021-07-08 ENCOUNTER — Other Ambulatory Visit: Payer: Self-pay

## 2021-07-08 DIAGNOSIS — M546 Pain in thoracic spine: Secondary | ICD-10-CM | POA: Insufficient documentation

## 2021-07-08 DIAGNOSIS — J9811 Atelectasis: Secondary | ICD-10-CM | POA: Diagnosis not present

## 2021-07-08 DIAGNOSIS — R112 Nausea with vomiting, unspecified: Secondary | ICD-10-CM | POA: Diagnosis not present

## 2021-07-08 DIAGNOSIS — R4182 Altered mental status, unspecified: Secondary | ICD-10-CM | POA: Insufficient documentation

## 2021-07-08 DIAGNOSIS — M545 Low back pain, unspecified: Secondary | ICD-10-CM | POA: Diagnosis not present

## 2021-07-08 DIAGNOSIS — N281 Cyst of kidney, acquired: Secondary | ICD-10-CM | POA: Diagnosis not present

## 2021-07-08 DIAGNOSIS — Z20822 Contact with and (suspected) exposure to covid-19: Secondary | ICD-10-CM | POA: Diagnosis not present

## 2021-07-08 DIAGNOSIS — R519 Headache, unspecified: Secondary | ICD-10-CM | POA: Diagnosis not present

## 2021-07-08 DIAGNOSIS — R1013 Epigastric pain: Secondary | ICD-10-CM | POA: Insufficient documentation

## 2021-07-08 DIAGNOSIS — I509 Heart failure, unspecified: Secondary | ICD-10-CM | POA: Insufficient documentation

## 2021-07-08 DIAGNOSIS — R0902 Hypoxemia: Secondary | ICD-10-CM | POA: Diagnosis not present

## 2021-07-08 DIAGNOSIS — I10 Essential (primary) hypertension: Secondary | ICD-10-CM | POA: Insufficient documentation

## 2021-07-08 DIAGNOSIS — I2510 Atherosclerotic heart disease of native coronary artery without angina pectoris: Secondary | ICD-10-CM | POA: Diagnosis not present

## 2021-07-08 DIAGNOSIS — R110 Nausea: Secondary | ICD-10-CM | POA: Diagnosis not present

## 2021-07-08 LAB — CBC WITH DIFFERENTIAL/PLATELET
Abs Immature Granulocytes: 0.04 10*3/uL (ref 0.00–0.07)
Basophils Absolute: 0 10*3/uL (ref 0.0–0.1)
Basophils Relative: 0 %
Eosinophils Absolute: 0 10*3/uL (ref 0.0–0.5)
Eosinophils Relative: 0 %
HCT: 39.2 % (ref 39.0–52.0)
Hemoglobin: 13.2 g/dL (ref 13.0–17.0)
Immature Granulocytes: 0 %
Lymphocytes Relative: 4 %
Lymphs Abs: 0.5 10*3/uL — ABNORMAL LOW (ref 0.7–4.0)
MCH: 30.7 pg (ref 26.0–34.0)
MCHC: 33.7 g/dL (ref 30.0–36.0)
MCV: 91.2 fL (ref 80.0–100.0)
Monocytes Absolute: 0.5 10*3/uL (ref 0.1–1.0)
Monocytes Relative: 5 %
Neutro Abs: 9.2 10*3/uL — ABNORMAL HIGH (ref 1.7–7.7)
Neutrophils Relative %: 91 %
Platelets: 117 10*3/uL — ABNORMAL LOW (ref 150–400)
RBC: 4.3 MIL/uL (ref 4.22–5.81)
RDW: 13.1 % (ref 11.5–15.5)
WBC: 10.2 10*3/uL (ref 4.0–10.5)
nRBC: 0 % (ref 0.0–0.2)

## 2021-07-08 LAB — URINALYSIS, ROUTINE W REFLEX MICROSCOPIC
Bilirubin Urine: NEGATIVE
Glucose, UA: NEGATIVE mg/dL
Hgb urine dipstick: NEGATIVE
Ketones, ur: 20 mg/dL — AB
Leukocytes,Ua: NEGATIVE
Nitrite: NEGATIVE
Protein, ur: NEGATIVE mg/dL
Specific Gravity, Urine: >1.046 — ABNORMAL HIGH (ref 1.005–1.030)
pH: 6 (ref 5.0–8.0)

## 2021-07-08 LAB — TROPONIN I (HIGH SENSITIVITY)
Troponin I (High Sensitivity): 4 ng/L (ref <18)
Troponin I (High Sensitivity): 5 ng/L (ref <18)

## 2021-07-08 LAB — COMPREHENSIVE METABOLIC PANEL
ALT: 41 U/L (ref 0–44)
AST: 67 U/L — ABNORMAL HIGH (ref 15–41)
Albumin: 4 g/dL (ref 3.5–5.0)
Alkaline Phosphatase: 67 U/L (ref 38–126)
Anion gap: 12 (ref 5–15)
BUN: 25 mg/dL — ABNORMAL HIGH (ref 8–23)
CO2: 23 mmol/L (ref 22–32)
Calcium: 8.9 mg/dL (ref 8.9–10.3)
Chloride: 102 mmol/L (ref 98–111)
Creatinine, Ser: 0.9 mg/dL (ref 0.61–1.24)
GFR, Estimated: >60 mL/min (ref >60)
Glucose, Bld: 154 mg/dL — ABNORMAL HIGH (ref 70–99)
Potassium: 3.6 mmol/L (ref 3.5–5.1)
Sodium: 137 mmol/L (ref 135–145)
Total Bilirubin: 2.5 mg/dL — ABNORMAL HIGH (ref 0.3–1.2)
Total Protein: 7 g/dL (ref 6.5–8.1)

## 2021-07-08 LAB — RESP PANEL BY RT-PCR (FLU A&B, COVID) ARPGX2
Influenza A by PCR: NEGATIVE
Influenza B by PCR: NEGATIVE
SARS Coronavirus 2 by RT PCR: NEGATIVE

## 2021-07-08 LAB — LIPASE, BLOOD: Lipase: 27 U/L (ref 11–51)

## 2021-07-08 LAB — I-STAT CREATININE, ED: Creatinine, Ser: 0.9 mg/dL (ref 0.61–1.24)

## 2021-07-08 LAB — PROTIME-INR
INR: 2.3 — ABNORMAL HIGH (ref 0.8–1.2)
Prothrombin Time: 25.1 seconds — ABNORMAL HIGH (ref 11.4–15.2)

## 2021-07-08 LAB — MAGNESIUM: Magnesium: 1.7 mg/dL (ref 1.7–2.4)

## 2021-07-08 MED ORDER — FAMOTIDINE IN NACL 20-0.9 MG/50ML-% IV SOLN
20.0000 mg | Freq: Once | INTRAVENOUS | Status: AC
  Administered 2021-07-08: 20 mg via INTRAVENOUS
  Filled 2021-07-08: qty 50

## 2021-07-08 MED ORDER — SODIUM CHLORIDE 0.9 % IV BOLUS
1000.0000 mL | Freq: Once | INTRAVENOUS | Status: AC
Start: 2021-07-08 — End: 2021-07-08
  Administered 2021-07-08: 1000 mL via INTRAVENOUS

## 2021-07-08 MED ORDER — ONDANSETRON HCL 4 MG/2ML IJ SOLN
4.0000 mg | Freq: Once | INTRAMUSCULAR | Status: AC
Start: 2021-07-08 — End: 2021-07-08
  Administered 2021-07-08: 4 mg via INTRAVENOUS
  Filled 2021-07-08: qty 2

## 2021-07-08 MED ORDER — IOHEXOL 350 MG/ML SOLN
100.0000 mL | Freq: Once | INTRAVENOUS | Status: AC | PRN
  Administered 2021-07-08: 100 mL via INTRAVENOUS

## 2021-07-08 MED ORDER — ALUM & MAG HYDROXIDE-SIMETH 200-200-20 MG/5ML PO SUSP
30.0000 mL | Freq: Once | ORAL | Status: AC
  Administered 2021-07-08: 30 mL via ORAL
  Filled 2021-07-08: qty 30

## 2021-07-08 MED ORDER — HYDROMORPHONE HCL 1 MG/ML IJ SOLN
1.0000 mg | Freq: Once | INTRAMUSCULAR | Status: DC
  Filled 2021-07-08: qty 1

## 2021-07-08 MED ORDER — PANTOPRAZOLE SODIUM 40 MG PO TBEC: 40.0000 mg | DELAYED_RELEASE_TABLET | Freq: Every day | ORAL | 0 refills | Status: DC

## 2021-07-08 MED ORDER — LIDOCAINE VISCOUS HCL 2 % MT SOLN
15.0000 mL | Freq: Once | OROMUCOSAL | Status: AC
Start: 2021-07-08 — End: 2021-07-08
  Administered 2021-07-08: 15 mL via ORAL
  Filled 2021-07-08: qty 15

## 2021-07-08 MED ORDER — DOFETILIDE 125 MCG PO CAPS
250.0000 ug | ORAL_CAPSULE | Freq: Two times a day (BID) | ORAL | Status: DC
  Filled 2021-07-08 (×2): qty 1

## 2021-07-08 MED ORDER — CARVEDILOL 12.5 MG PO TABS
25.0000 mg | ORAL_TABLET | Freq: Two times a day (BID) | ORAL | Status: DC
  Administered 2021-07-08: 25 mg via ORAL
  Filled 2021-07-08: qty 2

## 2021-07-08 MED ORDER — SODIUM CHLORIDE 0.9 % IV SOLN
12.5000 mg | Freq: Once | INTRAVENOUS | Status: AC
  Administered 2021-07-08: 12.5 mg via INTRAVENOUS
  Filled 2021-07-08: qty 0.5

## 2021-07-08 MED ORDER — LACTATED RINGERS IV BOLUS
500.0000 mL | Freq: Once | INTRAVENOUS | Status: AC
  Administered 2021-07-08: 500 mL via INTRAVENOUS

## 2021-07-08 MED ORDER — LACTATED RINGERS IV BOLUS
1000.0000 mL | Freq: Once | INTRAVENOUS | Status: AC
  Administered 2021-07-08: 1000 mL via INTRAVENOUS

## 2021-07-08 NOTE — ED Provider Notes (Signed)
[facility] EMERGENCY DEPARTMENT Provider Note   CSN: [id number] Arrival date & time: 07/08/21  8295     History  Chief Complaint  Patient presents with   Back Pain    [name] is a 71 y.o. male.   Back Pain Associated symptoms: abdominal pain   Patient presenting for lower back pain radiating to abdomen.  Additional symptoms include vomiting last night.  Medical history includes lumbar herniation, polyneuropathy, iron deficiency anemia, atrial fibrillation, CHF, HTN, gout, BPH, CAD.  Yesterday, patient was moving some furniture.  He felt a strain in his left mid back.  He tried heat and ice to relieve the pain.  He tried sleeping in the chair.  Around midnight, he had worsened pain in his back and also had pain in his upper abdomen.  Abdominal pain felt like a tightness.  He had 3 episodes of vomiting throughout the night.  This morning, symptoms have eased off.  Currently he has very mild pain in the left side of his mid back.  He does not have any further abdominal pain.  He does not currently feel nauseous.  He is on a blood thinner.  Home Medications Prior to Admission medications   Medication Sig Start Date End Date Taking? Authorizing Provider  acetaminophen (TYLENOL) 500 MG tablet Take 1,000 mg by mouth every 6 (six) hours as needed for moderate pain.   Yes [provider]  allopurinol (ZYLOPRIM) 300 MG tablet Take 300 mg by mouth daily.    Yes [provider]  amLODipine (NORVASC) 5 MG tablet TAKE ONE TABLET BY MOUTH DAILY Patient taking differently: Take 5 mg by mouth daily. 01/10/20  Yes Camnitz, Will Hassell Done, MD  buPROPion (WELLBUTRIN XL) 150 MG 24 hr tablet Take 150 mg by mouth daily.   Yes [provider]  Calcium Carb-Cholecalciferol (CALCIUM + D3 PO) Take 1 tablet by mouth daily at 3 pm.    Yes [provider]  carvedilol (COREG) 25 MG tablet TAKE ONE TABLET BY MOUTH TWICE DAILY. TAKE WITH A MEAL. Please make yearly appt with  Dr. Curt Bears for February 2022 for future refills. Thank you 1st attempt Patient taking differently: Take 25 mg by mouth 2 (two) times daily with a meal. TAKE ONE TABLET BY MOUTH TWICE DAILY. TAKE WITH A MEAL. Please make yearly appt with Dr. Curt Bears for February 2022 for future refills. Thank you 1st attempt 05/01/20  Yes Camnitz, Will Hassell Done, MD  CINNAMON PO Take 350 mg by mouth daily at 3 pm.    Yes [provider]  Coenzyme Q10 200 MG capsule Take 400 mg by mouth daily in the afternoon.    Yes [provider]  dofetilide (TIKOSYN) 250 MCG capsule Take 1 capsule (250 mcg total) by mouth 2 (two) times daily. 03/13/21  Yes Camnitz, Will Hassell Done, MD  Ferrous Gluconate-C-Folic Acid (IRON-C PO) Take 1 tablet by mouth daily.   Yes [provider]  Glucosamine-Chondroitin (GLUCOSAMINE CHONDR COMPLEX PO) Take 1 tablet by mouth daily.    Yes [provider]  LUTEIN-ZEAXANTHIN PO Take 2 tablets by mouth daily in the afternoon.    Yes [provider]  Multiple Vitamins-Minerals (MULTIVITAMIN PO) Take 1 tablet by mouth daily at 3 pm.    Yes [provider]  olmesartan (BENICAR) 40 MG tablet Take 1 tablet (40 mg total) by mouth daily. 05/01/20  Yes Verta Ellen., NP  Omega-3 Fatty Acids (OMEGA 3 PO) Take 1,350 mg by mouth daily.  Yes [provider]  pantoprazole (PROTONIX) 40 MG tablet Take 1 tablet (40 mg total) by mouth daily. 07/08/21  Yes Godfrey Pick, MD  PHOSPHATIDYLCHOLINE PO Take 2 tablets by mouth daily in the afternoon.    Yes [provider]  Probiotic Product (PROBIOTIC DAILY PO) Take 1 tablet by mouth daily in the afternoon.   Yes [provider]  RESVERATROL 100 MG CAPS Take 100 mg by mouth daily at 3 pm.    Yes [provider]  rivaroxaban (XARELTO) 20 MG TABS tablet Take 1 tablet (20 mg total) by mouth daily with supper. 10/23/20  Yes Camnitz, Will Hassell Done, MD  tamsulosin (FLOMAX) 0.4 MG CAPS capsule Take  0.4 mg by mouth daily.  08/06/15  Yes [provider]  potassium chloride (KLOR-CON) 10 MEQ tablet Taking two tablets in the morning, Patient not taking: Reported on 07/08/2021 05/01/20   Constance Haw, MD      Allergies    Azithromycin    Review of Systems   Review of Systems  Gastrointestinal:  Positive for abdominal pain, nausea and vomiting.  Musculoskeletal:  Positive for back pain.  All other systems reviewed and are negative.  Physical Exam Updated Vital Signs BP (!) 107/50    Pulse 69    Temp (!) 97.4 F (36.3 C) (Oral)    Resp (!) 26    Ht 6\' 2"  (1.88 m)    Wt 122 kg    SpO2 93%    BMI 34.54 kg/m  Physical Exam Vitals and nursing note reviewed.  Constitutional:      General: He is not in acute distress.    Appearance: Normal appearance. He is well-developed. He is not ill-appearing, toxic-appearing or diaphoretic.  HENT:     Head: Normocephalic and atraumatic.     Right Ear: External ear normal.     Left Ear: External ear normal.     Nose: Nose normal.  Eyes:     Extraocular Movements: Extraocular movements intact.     Conjunctiva/sclera: Conjunctivae normal.  Cardiovascular:     Rate and Rhythm: Normal rate and regular rhythm.     Heart sounds: No murmur heard. Pulmonary:     Effort: Pulmonary effort is normal. No respiratory distress.  Abdominal:     General: There is no distension.     Palpations: Abdomen is soft. There is no mass.     Tenderness: There is no abdominal tenderness.  Musculoskeletal:        General: No swelling or tenderness. Normal range of motion.     Cervical back: Normal range of motion and neck supple. No rigidity.     Right lower leg: No edema.     Left lower leg: No edema.  Skin:    General: Skin is warm and dry.     Capillary Refill: Capillary refill takes less than 2 seconds.     Coloration: Skin is not jaundiced or pale.  Neurological:     General: No focal deficit present.     Mental Status: He is alert and  oriented to person, place, and time.     Cranial Nerves: No cranial nerve deficit.     Sensory: No sensory deficit.     Motor: No weakness.     Coordination: Coordination normal.  Psychiatric:        Mood and Affect: Mood normal.        Behavior: Behavior normal.        Thought Content: Thought content  normal.        Judgment: Judgment normal.    ED Results / Procedures / Treatments   Labs (all labs ordered are listed, but only abnormal results are displayed) Labs Reviewed  COMPREHENSIVE METABOLIC PANEL - Abnormal; Notable for the following components:      Result Value   Glucose, Bld 154 (*)    BUN 25 (*)    AST 67 (*)    Total Bilirubin 2.5 (*)    All other components within normal limits  CBC WITH DIFFERENTIAL/PLATELET - Abnormal; Notable for the following components:   Platelets 117 (*)    Neutro Abs 9.2 (*)    Lymphs Abs 0.5 (*)    All other components within normal limits  URINALYSIS, ROUTINE W REFLEX MICROSCOPIC - Abnormal; Notable for the following components:   Specific Gravity, Urine >1.046 (*)    Ketones, ur 20 (*)    All other components within normal limits  PROTIME-INR - Abnormal; Notable for the following components:   Prothrombin Time 25.1 (*)    INR 2.3 (*)    All other components within normal limits  RESP PANEL BY RT-PCR (FLU A&B, COVID) ARPGX2  LIPASE, BLOOD  MAGNESIUM  I-STAT CREATININE, ED  I-STAT CREATININE, ED  TROPONIN I (HIGH SENSITIVITY)  TROPONIN I (HIGH SENSITIVITY)    EKG EKG Interpretation  Date/Time:  Tuesday July 08 2021 07:30:53 EST Ventricular Rate:  74 PR Interval:  175 QRS Duration: 104 QT Interval:  393 QTC Calculation: 436 R Axis:   1 Text Interpretation: Sinus rhythm Atrial premature complex Low voltage, precordial leads Borderline T abnormalities, diffuse leads Baseline wander in lead(s) V1 Confirmed by Godfrey Pick ([id number]) on 07/08/2021 11:07:24 AM  Radiology CT Head Wo Contrast  Result Date: 07/08/2021 CLINICAL  DATA:  Mental status change, back pain EXAM: CT HEAD WITHOUT CONTRAST TECHNIQUE: Contiguous axial images were obtained from the base of the skull through the vertex without intravenous contrast. RADIATION DOSE REDUCTION: This exam was performed according to the departmental dose-optimization program which includes automated exposure control, adjustment of the mA and/or kV according to patient size and/or use of iterative reconstruction technique. COMPARISON:  None. FINDINGS: Brain: No acute infarct or hemorrhage. Lateral ventricles and midline structures are unremarkable. No acute extra-axial fluid collections. No mass effect. Vascular: No hyperdense vessel or unexpected calcification. Skull: Normal. Negative for fracture or focal lesion. Sinuses/Orbits: Mild polypoid mucosal thickening left maxillary sinus. Remaining paranasal sinuses are clear. Other: None. IMPRESSION: 1. No acute intracranial process. Electronically Signed   By: Randa Ngo M.D.   On: 07/08/2021 15:51   DG Chest Portable 1 View  Result Date: 07/08/2021 CLINICAL DATA:  Hypoxia EXAM: PORTABLE CHEST 1 VIEW COMPARISON:  04/06/2019 FINDINGS: Elevated right hemidiaphragm and right lower lobe atelectasis similar to the prior study. Heart size and vascularity normal. Minimal left lower lobe atelectasis. No pleural effusion IMPRESSION: Elevated right hemidiaphragm. Bibasilar atelectasis right greater than left. No change from 2020. Electronically Signed   By: Franchot Gallo M.D.   On: 07/08/2021 13:10    Procedures Procedures    Medications Ordered in ED Medications  lactated ringers bolus 500 mL (0 mLs Intravenous Stopped 07/08/21 0845)  ondansetron (ZOFRAN) injection 4 mg (4 mg Intravenous Given 07/08/21 0808)  iohexol (OMNIPAQUE) 350 MG/ML injection 100 mL (100 mLs Intravenous Contrast Given 07/08/21 0805)  promethazine (PHENERGAN) 12.5 mg in sodium chloride 0.9 % 50 mL IVPB (0 mg Intravenous Stopped 07/08/21 1033)  alum & mag  hydroxide-simeth (MAALOX/MYLANTA)  200-200-20 MG/5ML suspension 30 mL (30 mLs Oral Given 07/08/21 1007)    And  lidocaine (XYLOCAINE) 2 % viscous mouth solution 15 mL (15 mLs Oral Given 07/08/21 1007)  famotidine (PEPCID) IVPB 20 mg premix (0 mg Intravenous Stopped 07/08/21 1040)  lactated ringers bolus 1,000 mL (0 mLs Intravenous Stopped 07/08/21 2026)  sodium chloride 0.9 % bolus 1,000 mL (0 mLs Intravenous Stopped 07/08/21 2026)    ED Course/ Medical Decision Making/ A&P                           Medical Decision Making Amount and/or Complexity of Data Reviewed Labs: ordered. Radiology: ordered.  Risk OTC drugs. Prescription drug management.   This patient presents to the ED for concern of back pain, this involves an extensive number of treatment options, and is a complaint that carries with it a high risk of complications and morbidity.  The differential diagnosis includes AAA, retroperitoneal hemorrhage, ACS, gastritis, muscle strain   Co morbidities that complicate the patient evaluation  lumbar herniation, polyneuropathy, iron deficiency anemia, atrial fibrillation, CHF, HTN, gout, BPH, CAD   Additional history obtained:  Additional history obtained from patient's wife External records from outside source obtained and reviewed including EMR   Lab Tests:  I Ordered, and personally interpreted labs.  The pertinent results include: Normal electrolytes, no leukocytosis, normal lipase, normal troponins, normal hemoglobin   Imaging Studies ordered:  I ordered imaging studies including CT angiogram of chest, abdomen, and pelvis I independently visualized and interpreted imaging which showed mild gallbladder distention without any other acute findings. I agree with the radiologist interpretation   Cardiac Monitoring:  The patient was maintained on a cardiac monitor.  I personally viewed and interpreted the cardiac monitored which showed an underlying rhythm of: Sinus  rhythm   Medicines ordered and prescription drug management:  I ordered medication including Zofran, Dilaudid, Phenergan, and GI cocktail for symptomatic relief Reevaluation of the patient after these medicines showed that the patient resolved I have reviewed the patients home medicines and have made adjustments as needed   Problem List / ED Course:  Pleasant 71 year old male presenting for back pain starting yesterday in addition to abdominal pain, nausea, and vomiting occurring overnight.  On arrival in the ED, he is well-appearing.  His vital signs are normal.  He currently endorses only very mild left-sided mid back pain.  He denies any current abdominal pain or nausea.  Abdomen is soft and nontender without any pulsatile masses appreciated on palpation.  EKG shows sinus rhythm without evidence of ischemia.  Patient is on dofetilide and carvedilol for his atrial fibrillation, which are due this morning.  He is also on a blood thinner which he takes at night.  Patient undergo laboratory work-up in addition to CT angiography to assess for aortic etiology.  Patient declines any current analgesia.  Shortly after arrival, patient endorsed recurrence of severe abdominal pain and nausea.  On reassessment, he appears very uncomfortable.  Dilaudid and Zofran were ordered.  CT was called to expedite scan.  Patient declined Dilaudid.  He continued to have nausea and Phenergan was given.  Results of CT scan did not show any evidence of aortic pathology.  Other than a mildly distended gallbladder, no acute findings were identified.  On his lab work, results are reassuring.  Patient was given GI cocktail 4.  Treatment of possible gastritis/PUD/GERD.  He does have a history of GERD and was previously on  a PPI but discontinued use of this medication several months ago.  On reassessment, patient had SPO2 in the high 80s on room air.  He was placed on supplemental oxygen.  He stated that his abdominal pain and nausea  had resolved.  At this point, he did endorse fatigue and had some evidence of slurred speech.  I suspect this is a side effect from the Phenergan.  CT of head was ordered.  In regards to his hypoxia, patient does have evidence of atelectasis on imaging.  He was encouraged to take deep breaths.  He was able to maintain normal SPO2 on room air.  CT of head showed no acute findings.  During his prolonged observation in the ED, he did have resolution of fatigue and slurred speech.  He had some soft blood pressures so an additional bolus of IV fluids was ordered.  Care of patient was signed out to oncoming ED provider.  I do feel the patient would benefit from resuming PPI therapy and following up with his primary provider to discuss medications.   Reevaluation:  After the interventions noted above, I reevaluated the patient and found that they have :resolved   Social Determinants of Health:  Lives at home with wife, has access to outpatient care.   Dispostion:  After consideration of the diagnostic results and the patients response to treatment, I feel that the patent would benefit from reassessment.          Final Clinical Impression(s) / ED Diagnoses Final diagnoses:  Acute left-sided thoracic back pain  Epigastric pain  Nausea and vomiting, unspecified vomiting type    Rx / DC Orders ED Discharge Orders          Ordered    pantoprazole (PROTONIX) 40 MG tablet  Daily        07/08/21 1638              Godfrey Pick, MD 07/10/21 [phone]

## 2021-07-08 NOTE — ED Triage Notes (Signed)
Pt presents to ED with complaints of lower back cramping radiating to abdomen, and vomiting started last night.

## 2021-07-08 NOTE — ED Notes (Signed)
Pt unable to lay down for CT due to nausea. Zofran 4mg  IV given in CT to help assist with positioning and nausea. RN in CT with pt.

## 2021-07-08 NOTE — Discharge Instructions (Signed)
Follow up with your md in 1-2 days for recheck.   Rest at home and drink plenty of fluids

## 2021-07-10 DIAGNOSIS — I700 Atherosclerosis of aorta: Secondary | ICD-10-CM | POA: Diagnosis not present

## 2021-07-10 DIAGNOSIS — I10 Essential (primary) hypertension: Secondary | ICD-10-CM | POA: Diagnosis not present

## 2021-07-10 DIAGNOSIS — Z299 Encounter for prophylactic measures, unspecified: Secondary | ICD-10-CM | POA: Diagnosis not present

## 2021-07-10 DIAGNOSIS — R600 Localized edema: Secondary | ICD-10-CM | POA: Diagnosis not present

## 2021-07-10 DIAGNOSIS — R109 Unspecified abdominal pain: Secondary | ICD-10-CM | POA: Diagnosis not present

## 2021-07-10 DIAGNOSIS — I429 Cardiomyopathy, unspecified: Secondary | ICD-10-CM | POA: Diagnosis not present

## 2021-07-17 DIAGNOSIS — E042 Nontoxic multinodular goiter: Secondary | ICD-10-CM | POA: Diagnosis not present

## 2021-07-17 DIAGNOSIS — E041 Nontoxic single thyroid nodule: Secondary | ICD-10-CM | POA: Diagnosis not present

## 2021-07-18 DIAGNOSIS — Z299 Encounter for prophylactic measures, unspecified: Secondary | ICD-10-CM | POA: Diagnosis not present

## 2021-07-18 DIAGNOSIS — I10 Essential (primary) hypertension: Secondary | ICD-10-CM | POA: Diagnosis not present

## 2021-07-31 ENCOUNTER — Ambulatory Visit: Payer: Medicare Other | Admitting: Cardiology

## 2021-07-31 ENCOUNTER — Encounter: Payer: Self-pay | Admitting: Cardiology

## 2021-07-31 ENCOUNTER — Other Ambulatory Visit: Payer: Self-pay

## 2021-07-31 VITALS — BP 136/76 | HR 63 | Ht 74.0 in | Wt 259.6 lb

## 2021-07-31 DIAGNOSIS — I4819 Other persistent atrial fibrillation: Secondary | ICD-10-CM

## 2021-07-31 NOTE — Patient Instructions (Signed)
Medication Instructions:  Your physician recommends that you continue on your current medications as directed. Please refer to the Current Medication list given to you today.  *If you need a refill on your cardiac medications before your next appointment, please call your pharmacy*   Lab Work: None ordered   Testing/Procedures: None ordered   Follow-Up: At [facility], you and your health needs are our priority.  As part of our continuing mission to provide you with exceptional heart care, we have created designated Provider Care Teams.  These Care Teams include your primary Cardiologist (physician) and Advanced Practice Providers (APPs -  Physician Assistants and Nurse Practitioners) who all work together to provide you with the care you need, when you need it.  Your next appointment:   6 month(s)  The format for your next appointment:   In Person  Provider:   You will see one of the following Advanced Practice Providers on your designated Care Team:   Renee Ursuy, PA-C Michael "Andy" Tillery, PA-C   Thank you for choosing [facility]!!   Shuntel Fishburn, RN [phone]         

## 2021-07-31 NOTE — Progress Notes (Signed)
Electrophysiology Office Note   Date:  07/31/2021   ID:  [name], DOB [birth year], MRN [id number]  PCP:  Glenda Chroman, MD  Cardiologist:  Lovena Le Primary Electrophysiologist:  Dawnette Mione Meredith Leeds, MD    Chief Complaint: AF   History of Present Illness: [name] is a 71 y.o. male who is being seen today for the evaluation of AF at the request of [name]. Presenting today for electrophysiology evaluation.  He has a history significant for atrial fibrillation and flutter status post cardioversion and hypertension.  He is on dofetilide.  He is status post atrial fibrillation ablation 04/04/2019.  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  Today, denies symptoms of palpitations, chest pain, shortness of breath, orthopnea, PND, lower extremity edema, claudication, dizziness, presyncope, syncope, bleeding, or neurologic sequela. The patient is tolerating medications without difficulties.  Since being seen he has noted no further episodes of atrial fibrillation.  He had an episode of abdominal pain and required emergency room visit.  He had CT scans that showed no major abnormality.  Since then he has been feeling well.  Past Medical History:  Diagnosis Date   Arthritis    "knuckles maybe" (02/28/2018)   Atrial fib/flutter, transient    a. s/p TEE-guided DCCV on 12/14/2017 with return to NSR.    BPH (benign prostatic hyperplasia) 12/11/2017   CHF (congestive heart failure) ([place])    Echo in November 2020 with EF 45-50% mild LVH, global hypokinesis.   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   Pneumonia 11/2017   Thyroid nodule    Benign   Past Surgical History:  Procedure Laterality Date   ATRIAL FIBRILLATION ABLATION N/A 04/04/2019   Procedure: ATRIAL FIBRILLATION ABLATION;  Surgeon: Constance Haw, MD;  Location: [place] [facility];  Service: Cardiovascular;  Laterality: N/A;   BACK SURGERY     BIOPSY THYROID     CARDIOVERSION N/A 12/14/2017   Procedure: CARDIOVERSION;  Surgeon: Satira Sark, MD;  [facility];  Service: Cardiovascular;  Laterality: N/A;   CARDIOVERSION N/A 01/14/2019   Procedure: CARDIOVERSION;  Surgeon: Constance Haw, MD;  Location: [place];  Service: Cardiovascular;  Laterality: N/A;   CATARACT EXTRACTION W/ INTRAOCULAR LENS  IMPLANT, BILATERAL Bilateral 2006-2010   "right-left"   COLONOSCOPY  12/30/2010   [facility]; Dr. Anthony Sar; normal colonoscopy.  Repeat in 10 years.   COLONOSCOPY WITH PROPOFOL N/A 08/01/2020   Procedure: COLONOSCOPY WITH PROPOFOL;  Surgeon: Daneil Dolin, MD;  [facility];  Service: Endoscopy;  Laterality: N/A;  AM-pt requests as early as possible   IRRIGATION AND DEBRIDEMENT SEBACEOUS CYST     LUMBAR LAMINECTOMY N/A 01/20/2013   Procedure: MICRODISCECTOMY LUMBAR LAMINECTOMY;  Surgeon: Marybelle Killings, MD;  Location: [place];  Service: Orthopedics;  Laterality: N/A;  L4-5 Decompression   PILONIDAL CYST DRAINAGE     POLYPECTOMY  08/01/2020   Procedure: POLYPECTOMY INTESTINAL;  Surgeon: Daneil Dolin, MD;  [facility];  Service: Endoscopy;;  splenic flexure colon polyp;    TEE WITHOUT CARDIOVERSION N/A 12/14/2017   Procedure: TRANSESOPHAGEAL ECHOCARDIOGRAM (TEE) WITH PROPOFOL;  Surgeon: Satira Sark, MD;  [facility];  Service: Cardiovascular;  Laterality: N/A;     Current Outpatient Medications  Medication  Sig Dispense Refill   acetaminophen (TYLENOL) 500 MG tablet Take 1,000 mg by mouth every 6 (six) hours as needed for moderate pain.     allopurinol (ZYLOPRIM) 300 MG tablet Take 300 mg by mouth daily.      amLODipine (NORVASC) 5 MG tablet TAKE ONE TABLET BY MOUTH DAILY (Patient taking differently: Take 5 mg by mouth daily.) 30 tablet 0   buPROPion (WELLBUTRIN  XL) 150 MG 24 hr tablet Take 150 mg by mouth daily.     Calcium Carb-Cholecalciferol (CALCIUM + D3 PO) Take 1 tablet by mouth daily at 3 pm.      carvedilol (COREG) 25 MG tablet TAKE ONE TABLET BY MOUTH TWICE DAILY. TAKE WITH A MEAL. Please make yearly appt with Dr. Curt Bears for February 2022 for future refills. Thank you 1st attempt (Patient taking differently: Take 25 mg by mouth 2 (two) times daily with a meal. TAKE ONE TABLET BY MOUTH TWICE DAILY. TAKE WITH A MEAL. Please make yearly appt with Dr. Curt Bears for February 2022 for future refills. Thank you 1st attempt) 90 tablet 0   CINNAMON PO Take 350 mg by mouth daily at 3 pm.      Coenzyme Q10 200 MG capsule Take 400 mg by mouth daily in the afternoon.      dofetilide (TIKOSYN) 250 MCG capsule Take 1 capsule (250 mcg total) by mouth 2 (two) times daily. 180 capsule 2   Ferrous Gluconate-C-Folic Acid (IRON-C PO) Take 1 tablet by mouth daily.     Glucosamine-Chondroitin (GLUCOSAMINE CHONDR COMPLEX PO) Take 1 tablet by mouth daily.      LUTEIN-ZEAXANTHIN PO Take 2 tablets by mouth daily in the afternoon.      Multiple Vitamins-Minerals (MULTIVITAMIN PO) Take 1 tablet by mouth daily at 3 pm.      olmesartan (BENICAR) 40 MG tablet Take 1 tablet (40 mg total) by mouth daily. 15 tablet 0   Omega-3 Fatty Acids (OMEGA 3 PO) Take 1,350 mg by mouth daily.     pantoprazole (PROTONIX) 40 MG tablet Take 1 tablet (40 mg total) by mouth daily. 30 tablet 0   PHOSPHATIDYLCHOLINE PO Take 2 tablets by mouth daily in the afternoon.      potassium chloride (KLOR-CON) 10 MEQ tablet Taking two tablets in the morning, (Patient not taking: Reported on 07/08/2021)     Probiotic Product (PROBIOTIC DAILY PO) Take 1 tablet by mouth daily in the afternoon.     RESVERATROL 100 MG CAPS Take 100 mg by mouth daily at 3 pm.      rivaroxaban (XARELTO) 20 MG TABS tablet Take 1 tablet (20 mg total) by mouth daily with supper. 90 tablet 1   tamsulosin (FLOMAX) 0.4 MG CAPS capsule Take  0.4 mg by mouth daily.   5   No current facility-administered medications for this visit.    Allergies:   Azithromycin   Social History:  The patient  reports that he has never smoked. He has never used smokeless tobacco. He reports that he does not drink alcohol and does not use drugs.   Family History:  The patient's family history includes Atrial fibrillation in his mother; Cancer - Lung in his father; Colon cancer in his maternal uncle; Colon polyps in his mother; Dementia in his mother; Hypertension in an other family member; Liver cancer in his brother; Lung cancer in his father; Transient ischemic attack in his mother.   ROS:  Please see the history of present illness.   Otherwise, review of  systems is positive for none.   All other systems are reviewed and negative.   PHYSICAL EXAM: VS:  There were no vitals taken for this visit. , BMI There is no height or weight on file to calculate BMI. GEN: Well nourished, well developed, in no acute distress  HEENT: normal  Neck: no JVD, carotid bruits, or masses Cardiac: RRR; no murmurs, rubs, or gallops,no edema  Respiratory:  clear to auscultation bilaterally, normal work of breathing GI: soft, nontender, nondistended, + BS MS: no deformity or atrophy  Skin: warm and dry Neuro:  Strength and sensation are intact Psych: euthymic mood, full affect  EKG:  EKG is not ordered today. Personal review of the ekg ordered 07/08/21 shows sinus rhythm, PACs  Recent Labs: 07/08/2021: ALT 41; BUN 25; Creatinine, Ser 0.90; Hemoglobin 13.2; Magnesium 1.7; Platelets 117; Potassium 3.6; Sodium 137    Lipid Panel  No results found for: CHOL, TRIG, HDL, CHOLHDL, VLDL, LDLCALC, LDLDIRECT   Wt Readings from Last 3 Encounters:  07/08/21 269 lb (122 kg)  02/11/21 268 lb (121.6 kg)  08/19/20 263 lb (119.3 kg)      Other studies Reviewed: Additional studies/ records that were reviewed today include: TTE 12/13/18  Review of the above records today  demonstrates:  - Left ventricle: EF difficult to estimate due to presence of   atrial fibrillation and variable heart rate. Appears mildly   depressed. The cavity size was normal. Wall thickness was   increased in a pattern of mild LVH. Systolic function was mildly   reduced. The estimated ejection fraction was in the range of 45%   to 50%. Wall motion was normal; there were no regional wall   motion abnormalities. - Left atrium: The atrium was moderately dilated. - Pulmonary arteries: Systolic pressure was mildly increased. PA   peak pressure: 32 mm Hg (S).   ASSESSMENT AND PLAN:  1.  Persistent atrial fibrillation/flutter: Currently on dofetilide 250 mcg twice daily, Xarelto 20 mg daily.  High risk medication monitoring for dofetilide via ECG reviewed from February.  CHA2DS2-VASc of 2.  He is now status post atrial fibrillation ablation 04/04/2019.  This was complicated by phrenic nerve palsy.  He is remained in sinus rhythm.  He is overall happy with his control.  2.  Hypertension: Currently well controlled   Current medicines are reviewed at length with the patient today.   The patient does not have concerns regarding his medicines.  The following changes were made today: None  Labs/ tests ordered today include:  No orders of the defined types were placed in this encounter.     Disposition:   FU with Fabrizzio Marcella 6 months  Signed, Latron Ribas Meredith Leeds, MD  07/31/2021 10:09 AM     [facility] [address] [place] [address] [phone] (office) [phone] (fax)

## 2021-08-05 ENCOUNTER — Encounter (HOSPITAL_COMMUNITY): Payer: Self-pay | Admitting: Radiology

## 2021-08-21 DIAGNOSIS — Z0000 Encounter for general adult medical examination without abnormal findings: Secondary | ICD-10-CM | POA: Diagnosis not present

## 2021-08-21 DIAGNOSIS — Z7189 Other specified counseling: Secondary | ICD-10-CM | POA: Diagnosis not present

## 2021-08-21 DIAGNOSIS — Z299 Encounter for prophylactic measures, unspecified: Secondary | ICD-10-CM | POA: Diagnosis not present

## 2021-08-21 DIAGNOSIS — I10 Essential (primary) hypertension: Secondary | ICD-10-CM | POA: Diagnosis not present

## 2021-09-16 ENCOUNTER — Other Ambulatory Visit: Payer: Self-pay

## 2021-09-16 MED ORDER — RIVAROXABAN 20 MG PO TABS: 20.0000 mg | ORAL_TABLET | Freq: Every day | ORAL | 3 refills | Status: AC

## 2021-09-16 NOTE — Telephone Encounter (Signed)
Prescription refill request for Xarelto received.  ?Indication: Atrial Fib ?Last office visit: 07/31/21  Elliot Cousin MD ?Weight: 117.8kg ?Age: 71 ?Scr: 0.90 on 07/08/21 ?CrCl: 123.62 ? ?Based on above findings Xarelto '20mg'$  daily is the appropriate dose.  Refill approved. ? ?

## 2021-10-22 DIAGNOSIS — I10 Essential (primary) hypertension: Secondary | ICD-10-CM | POA: Diagnosis not present

## 2021-10-22 DIAGNOSIS — I429 Cardiomyopathy, unspecified: Secondary | ICD-10-CM | POA: Diagnosis not present

## 2021-10-22 DIAGNOSIS — F32A Depression, unspecified: Secondary | ICD-10-CM | POA: Diagnosis not present

## 2021-10-22 DIAGNOSIS — I4891 Unspecified atrial fibrillation: Secondary | ICD-10-CM | POA: Diagnosis not present

## 2021-12-02 DIAGNOSIS — H1132 Conjunctival hemorrhage, left eye: Secondary | ICD-10-CM | POA: Diagnosis not present

## 2021-12-02 DIAGNOSIS — I10 Essential (primary) hypertension: Secondary | ICD-10-CM | POA: Diagnosis not present

## 2021-12-02 DIAGNOSIS — D6869 Other thrombophilia: Secondary | ICD-10-CM | POA: Diagnosis not present

## 2021-12-02 DIAGNOSIS — K219 Gastro-esophageal reflux disease without esophagitis: Secondary | ICD-10-CM | POA: Diagnosis not present

## 2021-12-02 DIAGNOSIS — Z299 Encounter for prophylactic measures, unspecified: Secondary | ICD-10-CM | POA: Diagnosis not present

## 2021-12-26 DIAGNOSIS — Z789 Other specified health status: Secondary | ICD-10-CM | POA: Diagnosis not present

## 2021-12-26 DIAGNOSIS — U071 COVID-19: Secondary | ICD-10-CM | POA: Diagnosis not present

## 2021-12-26 DIAGNOSIS — Z299 Encounter for prophylactic measures, unspecified: Secondary | ICD-10-CM | POA: Diagnosis not present

## 2021-12-26 DIAGNOSIS — I10 Essential (primary) hypertension: Secondary | ICD-10-CM | POA: Diagnosis not present

## 2021-12-26 DIAGNOSIS — H6990 Unspecified Eustachian tube disorder, unspecified ear: Secondary | ICD-10-CM | POA: Diagnosis not present

## 2021-12-31 DIAGNOSIS — I10 Essential (primary) hypertension: Secondary | ICD-10-CM | POA: Diagnosis not present

## 2021-12-31 DIAGNOSIS — Z299 Encounter for prophylactic measures, unspecified: Secondary | ICD-10-CM | POA: Diagnosis not present

## 2021-12-31 DIAGNOSIS — I4891 Unspecified atrial fibrillation: Secondary | ICD-10-CM | POA: Diagnosis not present

## 2021-12-31 DIAGNOSIS — Z789 Other specified health status: Secondary | ICD-10-CM | POA: Diagnosis not present

## 2021-12-31 DIAGNOSIS — H6993 Unspecified Eustachian tube disorder, bilateral: Secondary | ICD-10-CM | POA: Diagnosis not present

## 2021-12-31 DIAGNOSIS — I700 Atherosclerosis of aorta: Secondary | ICD-10-CM | POA: Diagnosis not present

## 2022-01-30 ENCOUNTER — Encounter: Payer: Self-pay | Admitting: Cardiology

## 2022-01-30 ENCOUNTER — Ambulatory Visit: Payer: Medicare Other | Attending: Cardiology | Admitting: Cardiology

## 2022-01-30 VITALS — BP 128/70 | HR 61 | Ht 74.0 in | Wt 263.0 lb

## 2022-01-30 DIAGNOSIS — I4819 Other persistent atrial fibrillation: Secondary | ICD-10-CM | POA: Diagnosis not present

## 2022-01-30 DIAGNOSIS — I10 Essential (primary) hypertension: Secondary | ICD-10-CM

## 2022-01-30 DIAGNOSIS — Z79899 Other long term (current) drug therapy: Secondary | ICD-10-CM

## 2022-01-30 DIAGNOSIS — D6869 Other thrombophilia: Secondary | ICD-10-CM

## 2022-01-30 NOTE — Patient Instructions (Signed)
Medication Instructions:  Your physician recommends that you continue on your current medications as directed. Please refer to the Current Medication list given to you today.  *If you need a refill on your cardiac medications before your next appointment, please call your pharmacy*   Lab Work: Today: BMET, CBC & Magnesium  If you have labs (blood work) drawn today and your tests are completely normal, you will receive your results only by: [place] (if you have MyChart) OR A paper copy in the mail If you have any lab test that is abnormal or we need to change your treatment, we will call you to review the results.   Testing/Procedures: None ordered   Follow-Up: At [facility], you and your health needs are our priority.  As part of our continuing mission to provide you with exceptional heart care, we have created designated Provider Care Teams.  These Care Teams include your primary Cardiologist (physician) and Advanced Practice Providers (APPs -  Physician Assistants and Nurse Practitioners) who all work together to provide you with the care you need, when you need it.  Your next appointment:   6 month(s)  The format for your next appointment:   In Person  Provider:   You will follow up in the [facility] located at [facility]. Your provider will be: [name], NP or Clint R. Fenton, PA-C    Thank you for choosing [facility]!!   Trinidad Curet, RN [phone]  Other Instructions   Important Information About Sugar

## 2022-01-30 NOTE — Progress Notes (Signed)
Electrophysiology Office Note   Date:  01/30/2022   ID:  [name], DOB 06/02/1950, MRN [id number]  PCP:  Glenda Chroman, MD  Cardiologist:  Lovena Le Primary Electrophysiologist:  Mischele Detter Meredith Leeds, MD    Chief Complaint: AF   History of Present Illness: [name] is a 71 y.o. male who is being seen today for the evaluation of AF at the request of [name]. Presenting today for electrophysiology evaluation.  He has a history significant for atrial fibrillation and flutter status post cardioversion, hypertension.  He is on dofetilide.  He is post atrial fibrillation ablation 04/04/2019.  He developed phrenic nerve palsy despite pacing anterior to the right-sided pulmonary veins.  Today, denies symptoms of palpitations, chest pain, shortness of breath, orthopnea, PND, lower extremity edema, claudication, dizziness, presyncope, syncope, bleeding, or neurologic sequela. The patient is tolerating medications without difficulties.  Since being seen he has done well.  He has had no chest pain or shortness of breath.  He is noted no further episodes of atrial fibrillation.  He is able to all of his daily activities.  He has a few episodes of palpitations, though they are only a few seconds long.  He is overall quite happy with his control.   Past Medical History:  Diagnosis Date   Arthritis    "knuckles maybe" (02/28/2018)   Atrial fib/flutter, transient    a. s/p TEE-guided DCCV on 12/14/2017 with return to NSR.    BPH (benign prostatic hyperplasia) 12/11/2017   CHF (congestive heart failure) ([place])    Echo in November 2020 with EF 45-50% mild LVH, global hypokinesis.   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   Pneumonia 11/2017   Thyroid nodule    Benign   Past Surgical History:  Procedure Laterality Date   ATRIAL FIBRILLATION ABLATION N/A 04/04/2019    Procedure: ATRIAL FIBRILLATION ABLATION;  Surgeon: Constance Haw, MD;  Location: [place] [facility];  Service: Cardiovascular;  Laterality: N/A;   BACK SURGERY     BIOPSY THYROID     CARDIOVERSION N/A 12/14/2017   Procedure: CARDIOVERSION;  Surgeon: Satira Sark, MD;  [facility];  Service: Cardiovascular;  Laterality: N/A;   CARDIOVERSION N/A 01/14/2019   Procedure: CARDIOVERSION;  Surgeon: Constance Haw, MD;  Location: [place];  Service: Cardiovascular;  Laterality: N/A;   CATARACT EXTRACTION W/ INTRAOCULAR LENS  IMPLANT, BILATERAL Bilateral 2006-2010   "right-left"   COLONOSCOPY  12/30/2010   [facility]; Dr. Anthony Sar; normal colonoscopy.  Repeat in 10 years.   COLONOSCOPY WITH PROPOFOL N/A 08/01/2020   Procedure: COLONOSCOPY WITH PROPOFOL;  Surgeon: Daneil Dolin, MD;  [facility];  Service: Endoscopy;  Laterality: N/A;  AM-pt requests as early as possible   IRRIGATION AND DEBRIDEMENT SEBACEOUS CYST     LUMBAR LAMINECTOMY N/A 01/20/2013   Procedure: MICRODISCECTOMY LUMBAR LAMINECTOMY;  Surgeon: Marybelle Killings, MD;  Location: [place];  Service: Orthopedics;  Laterality: N/A;  L4-5 Decompression   PILONIDAL CYST DRAINAGE     POLYPECTOMY  08/01/2020   Procedure: POLYPECTOMY INTESTINAL;  Surgeon: Daneil Dolin, MD;  [facility];  Service: Endoscopy;;  splenic flexure colon polyp;    TEE WITHOUT CARDIOVERSION N/A 12/14/2017   Procedure: TRANSESOPHAGEAL ECHOCARDIOGRAM (TEE) WITH PROPOFOL;  Surgeon: Satira Sark, MD;  [facility];  Service: Cardiovascular;  Laterality: N/A;     Current Outpatient Medications  Medication Sig Dispense Refill   allopurinol (ZYLOPRIM) 300 MG tablet Take 300 mg by mouth daily.      amLODipine (NORVASC) 5 MG tablet TAKE ONE TABLET BY MOUTH DAILY 30 tablet 0   buPROPion (WELLBUTRIN XL) 150 MG 24 hr tablet Take 150 mg by mouth daily.     Calcium Carb-Cholecalciferol (CALCIUM + D3 PO) Take 1  tablet by mouth daily at 3 pm.      carvedilol (COREG) 25 MG tablet TAKE ONE TABLET BY MOUTH TWICE DAILY. TAKE WITH A MEAL. Please make yearly appt with Dr. Curt Bears for February 2022 for future refills. Thank you 1st attempt 90 tablet 0   CINNAMON PO Take 350 mg by mouth daily at 3 pm.      Coenzyme Q10 200 MG capsule Take 400 mg by mouth daily in the afternoon.      dofetilide (TIKOSYN) 250 MCG capsule Take 1 capsule (250 mcg total) by mouth 2 (two) times daily. 180 capsule 2   Ferrous Gluconate-C-Folic Acid (IRON-C PO) Take 1 tablet by mouth daily.     Glucosamine-Chondroitin (GLUCOSAMINE CHONDR COMPLEX PO) Take 1 tablet by mouth daily.      LUTEIN-ZEAXANTHIN PO Take 2 tablets by mouth daily in the afternoon.      Multiple Vitamins-Minerals (MULTIVITAMIN PO) Take 1 tablet by mouth daily at 3 pm.      olmesartan (BENICAR) 40 MG tablet Take 1 tablet (40 mg total) by mouth daily. 15 tablet 0   Omega-3 Fatty Acids (OMEGA 3 PO) Take 1,350 mg by mouth daily.     PHOSPHATIDYLCHOLINE PO Take 2 tablets by mouth daily in the afternoon.      Probiotic Product (PROBIOTIC DAILY PO) Take 1 tablet by mouth daily in the afternoon.     RESVERATROL 100 MG CAPS Take 100 mg by mouth daily at 3 pm.      rivaroxaban (XARELTO) 20 MG TABS tablet Take 1 tablet (20 mg total) by mouth daily with supper. 90 tablet 3   tamsulosin (FLOMAX) 0.4 MG CAPS capsule Take 0.4 mg by mouth daily.   5   No current facility-administered medications for this visit.    Allergies:   Azithromycin   Social History:  The patient  reports that he has never smoked. He has never used smokeless tobacco. He reports that he does not drink alcohol and does not use drugs.   Family History:  The patient's family history includes Atrial fibrillation in his mother; Cancer - Lung in his father; Colon cancer in his maternal uncle; Colon polyps in his mother; Dementia in his mother; Hypertension in an other family member; Liver cancer in his brother;  Lung cancer in his father; Transient ischemic attack in his mother.   ROS:  Please see the history of present illness.   Otherwise, review of systems is positive for none.   All other systems are reviewed and negative.   PHYSICAL EXAM: VS:  BP 128/70   Pulse 61   Ht '6\' 2"'$  (1.88 m)   Wt 263 lb (119.3 kg)   SpO2 97%   BMI 33.77 kg/m  , BMI Body mass index is 33.77 kg/m. GEN: Well nourished, well developed, in no acute distress  HEENT: normal  Neck: no JVD, carotid bruits, or masses Cardiac: RRR; no murmurs, rubs, or gallops,no edema  Respiratory:  clear to auscultation bilaterally, normal work of breathing GI: soft,  nontender, nondistended, + BS MS: no deformity or atrophy  Skin: warm and dry Neuro:  Strength and sensation are intact Psych: euthymic mood, full affect  EKG:  EKG is ordered today. Personal review of the ekg ordered shows sinus rhythm, rate 61  Recent Labs: 07/08/2021: ALT 41; BUN 25; Creatinine, Ser 0.90; Hemoglobin 13.2; Magnesium 1.7; Platelets 117; Potassium 3.6; Sodium 137    Lipid Panel  No results found for: "CHOL", "TRIG", "HDL", "CHOLHDL", "VLDL", "LDLCALC", "LDLDIRECT"   Wt Readings from Last 3 Encounters:  01/30/22 263 lb (119.3 kg)  07/31/21 259 lb 9.6 oz (117.8 kg)  07/08/21 269 lb (122 kg)      Other studies Reviewed: Additional studies/ records that were reviewed today include: TTE 12/13/18  Review of the above records today demonstrates:  - Left ventricle: EF difficult to estimate due to presence of   atrial fibrillation and variable heart rate. Appears mildly   depressed. The cavity size was normal. Wall thickness was   increased in a pattern of mild LVH. Systolic function was mildly   reduced. The estimated ejection fraction was in the range of 45%   to 50%. Wall motion was normal; there were no regional wall   motion abnormalities. - Left atrium: The atrium was moderately dilated. - Pulmonary arteries: Systolic pressure was mildly  increased. PA   peak pressure: 32 mm Hg (S).   ASSESSMENT AND PLAN:  1.  Persistent atrial fibrillation: Currently on dofetilide 250 mcg twice daily, Xarelto 20 mg daily patient monitoring for dofetilide.  Status post ablation 04/04/2019.  This was complicated by phrenic nerve palsy.  He remains in sinus rhythm.  He is overall quite happy with his control.  We Deyanira Fesler check labs today for dofetilide monitoring.  QTc has remained normal on ECG.  2.  Hypertension: Currently well controlled  3.  Secondary hypercoagulable state: Currently on Xarelto for atrial fibrillation as above  Current medicines are reviewed at length with the patient today.   The patient does not have concerns regarding his medicines.  The following changes were made today: None  Labs/ tests ordered today include:  Orders Placed This Encounter  Procedures   Basic metabolic panel   CBC   Magnesium   EKG 12-Lead      Disposition:   FU 6 months  Signed, Brenda Samano Meredith Leeds, MD  01/30/2022 10:51 AM     [facility] [address] [place] [address] [phone] (office) [phone] (fax)

## 2022-01-31 LAB — BASIC METABOLIC PANEL
BUN/Creatinine Ratio: 26 — ABNORMAL HIGH (ref 10–24)
BUN: 23 mg/dL (ref 8–27)
CO2: 25 mmol/L (ref 20–29)
Calcium: 8.9 mg/dL (ref 8.6–10.2)
Chloride: 106 mmol/L (ref 96–106)
Creatinine, Ser: 0.87 mg/dL (ref 0.76–1.27)
Glucose: 100 mg/dL — ABNORMAL HIGH (ref 70–99)
Potassium: 4.2 mmol/L (ref 3.5–5.2)
Sodium: 143 mmol/L (ref 134–144)
eGFR: 92 mL/min/{1.73_m2} (ref >59)

## 2022-01-31 LAB — CBC
Hematocrit: 39.4 % (ref 37.5–51.0)
Hemoglobin: 12.9 g/dL — ABNORMAL LOW (ref 13.0–17.7)
MCH: 29.5 pg (ref 26.6–33.0)
MCHC: 32.7 g/dL (ref 31.5–35.7)
MCV: 90 fL (ref 79–97)
Platelets: 149 10*3/uL — ABNORMAL LOW (ref 150–450)
RBC: 4.37 x10E6/uL (ref 4.14–5.80)
RDW: 13.7 % (ref 11.6–15.4)
WBC: 6.9 10*3/uL (ref 3.4–10.8)

## 2022-01-31 LAB — MAGNESIUM: Magnesium: 2.2 mg/dL (ref 1.6–2.3)

## 2022-02-02 DIAGNOSIS — H6993 Unspecified Eustachian tube disorder, bilateral: Secondary | ICD-10-CM | POA: Insufficient documentation

## 2022-02-02 DIAGNOSIS — H903 Sensorineural hearing loss, bilateral: Secondary | ICD-10-CM | POA: Diagnosis not present

## 2022-02-02 DIAGNOSIS — H6983 Other specified disorders of Eustachian tube, bilateral: Secondary | ICD-10-CM | POA: Diagnosis not present

## 2022-02-02 DIAGNOSIS — H9313 Tinnitus, bilateral: Secondary | ICD-10-CM | POA: Diagnosis not present

## 2022-02-21 DIAGNOSIS — I4891 Unspecified atrial fibrillation: Secondary | ICD-10-CM | POA: Diagnosis not present

## 2022-02-21 DIAGNOSIS — I10 Essential (primary) hypertension: Secondary | ICD-10-CM | POA: Diagnosis not present

## 2022-03-16 ENCOUNTER — Other Ambulatory Visit: Payer: Self-pay

## 2022-03-16 MED ORDER — DOFETILIDE 250 MCG PO CAPS: 250.0000 ug | ORAL_CAPSULE | Freq: Two times a day (BID) | ORAL | 3 refills | Status: DC

## 2022-04-17 DIAGNOSIS — H00019 Hordeolum externum unspecified eye, unspecified eyelid: Secondary | ICD-10-CM | POA: Diagnosis not present

## 2022-04-17 DIAGNOSIS — I10 Essential (primary) hypertension: Secondary | ICD-10-CM | POA: Diagnosis not present

## 2022-04-17 DIAGNOSIS — Z299 Encounter for prophylactic measures, unspecified: Secondary | ICD-10-CM | POA: Diagnosis not present

## 2022-04-27 DIAGNOSIS — Z79899 Other long term (current) drug therapy: Secondary | ICD-10-CM | POA: Diagnosis not present

## 2022-04-27 DIAGNOSIS — E7800 Pure hypercholesterolemia, unspecified: Secondary | ICD-10-CM | POA: Diagnosis not present

## 2022-04-27 DIAGNOSIS — Z789 Other specified health status: Secondary | ICD-10-CM | POA: Diagnosis not present

## 2022-04-27 DIAGNOSIS — R5383 Other fatigue: Secondary | ICD-10-CM | POA: Diagnosis not present

## 2022-04-27 DIAGNOSIS — I10 Essential (primary) hypertension: Secondary | ICD-10-CM | POA: Diagnosis not present

## 2022-04-27 DIAGNOSIS — Z0000 Encounter for general adult medical examination without abnormal findings: Secondary | ICD-10-CM | POA: Diagnosis not present

## 2022-04-27 DIAGNOSIS — Z299 Encounter for prophylactic measures, unspecified: Secondary | ICD-10-CM | POA: Diagnosis not present

## 2022-07-02 ENCOUNTER — Encounter (HOSPITAL_COMMUNITY): Payer: Self-pay | Admitting: *Deleted

## 2022-07-19 ENCOUNTER — Ambulatory Visit
Admission: RE | Admit: 2022-07-19 | Discharge: 2022-07-19 | Disposition: A | Discharge status: Home / Self Care | Payer: Medicare Other | Source: Ambulatory Visit | Attending: Family Medicine | Admitting: Family Medicine

## 2022-07-19 ENCOUNTER — Other Ambulatory Visit: Payer: Self-pay

## 2022-07-19 VITALS — BP 126/73 | HR 59 | Temp 98.0°F | Resp 20

## 2022-07-19 DIAGNOSIS — L03211 Cellulitis of face: Secondary | ICD-10-CM | POA: Diagnosis not present

## 2022-07-19 DIAGNOSIS — H01002 Unspecified blepharitis right lower eyelid: Secondary | ICD-10-CM | POA: Diagnosis not present

## 2022-07-19 MED ORDER — ERYTHROMYCIN 5 MG/GM OP OINT: TOPICAL_OINTMENT | OPHTHALMIC | 0 refills | Status: DC

## 2022-07-19 MED ORDER — CEPHALEXIN 500 MG PO CAPS: 500.0000 mg | ORAL_CAPSULE | Freq: Two times a day (BID) | ORAL | 0 refills | Status: DC

## 2022-07-19 NOTE — Discharge Instructions (Signed)
Apply warm compresses, make sure to wash your hands before and after touching the area, take the full course of oral antibiotics and use the ointment until fully resolved.  Follow-up for worsening symptoms.

## 2022-07-19 NOTE — ED Provider Notes (Signed)
[facility]    CSN: [id number] Arrival date & time: 07/19/22  1145      History   Chief Complaint Chief Complaint  Patient presents with   Eye Problem    Just under my right eye it is red and swollen. My eye is watering. It looks like it may be infected. - Entered by patient    HPI ACETON MACEDA is a 72 y.o. male.   Patient presenting today with 4 to 5-day history of what initially was a small tender red spot to the right lower eyelid and is now progressed into a larger area of redness, swelling, tenderness below the right eye.  Started last night with some mild right eye redness and irritation additionally.  Denies fever, chills, headache, visual changes, injury to the area, new products used or chemical exposures.  Has been trying warm compresses with minimal relief.    Past Medical History:  Diagnosis Date   Arthritis    "knuckles maybe" (02/28/2018)   Atrial fib/flutter, transient    a. s/p TEE-guided DCCV on 12/14/2017 with return to NSR.    BPH (benign prostatic hyperplasia) 12/11/2017   CHF (congestive heart failure) ([place])    Echo in November 2020 with EF 45-50% mild LVH, global hypokinesis.   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) 04/08/2019   Status post ablation of atrial fibrillation 04/06/2019   CAP (community acquired pneumonia) 04/06/2019   Acute respiratory failure with hypoxemia ([place]) 04/06/2019   Chest pain of uncertain etiology [id number]   Acute exacerbation of CHF (congestive heart failure) ([place]) 04/06/2019   Atrial fibrillation ([place]) 01/14/2019   Atrial fibrillation with rapid ventricular  response ([place]) 01/13/2019   Visit for monitoring Tikosyn therapy 02/28/2018   CAD (coronary artery disease)    Atrial fibrillation with RVR ([place]) [id number]   Chronic systolic CHF (congestive heart failure) ([place]) 12/11/2017   HTN (hypertension) 12/11/2017   Gout 12/11/2017   BPH (benign prostatic hyperplasia) 12/11/2017   Periodic limb movement 10/31/2015   Numbness 10/31/2015   Restless leg 10/31/2015   Polyneuropathy 10/31/2015   Anemia, iron deficiency 10/31/2015   HNP (herniated nucleus pulposus), lumbar 01/20/2013    Class: Diagnosis of   Abnormal echocardiogram 02/23/2012    Past Surgical History:  Procedure Laterality Date   ATRIAL FIBRILLATION ABLATION N/A 04/04/2019   Procedure: ATRIAL FIBRILLATION ABLATION;  Surgeon: Constance Haw, MD;  Location: [facility];  Service: Cardiovascular;  Laterality: N/A;   BACK SURGERY     BIOPSY THYROID     CARDIOVERSION N/A 12/14/2017   Procedure: CARDIOVERSION;  Surgeon: Satira Sark, MD;  [facility]: [facility];  Service: Cardiovascular;  Laterality: N/A;   CARDIOVERSION N/A 01/14/2019   Procedure: CARDIOVERSION;  Surgeon: Constance Haw, MD;  Location: [place];  Service: Cardiovascular;  Laterality: N/A;   CATARACT EXTRACTION W/ INTRAOCULAR LENS  IMPLANT, BILATERAL Bilateral 2006-2010   "right-left"   COLONOSCOPY  12/30/2010   [facility]; Dr. Anthony Sar; normal colonoscopy.  Repeat in 10 years.   COLONOSCOPY WITH PROPOFOL N/A 08/01/2020  Procedure: COLONOSCOPY WITH PROPOFOL;  Surgeon: Daneil Dolin, MD;  [facility];  Service: Endoscopy;  Laterality: N/A;  AM-pt requests as early as possible   IRRIGATION AND DEBRIDEMENT SEBACEOUS CYST     LUMBAR LAMINECTOMY N/A 01/20/2013   Procedure: MICRODISCECTOMY LUMBAR LAMINECTOMY;  Surgeon: Marybelle Killings, MD;  Location: [facility];  Service: Orthopedics;  Laterality: N/A;  L4-5 Decompression   PILONIDAL CYST DRAINAGE     POLYPECTOMY  08/01/2020    Procedure: POLYPECTOMY INTESTINAL;  Surgeon: Daneil Dolin, MD;  [facility];  Service: Endoscopy;;  splenic flexure colon polyp;    TEE WITHOUT CARDIOVERSION N/A 12/14/2017   Procedure: TRANSESOPHAGEAL ECHOCARDIOGRAM (TEE) WITH PROPOFOL;  Surgeon: Satira Sark, MD;  [facility]: [facility];  Service: Cardiovascular;  Laterality: N/A;       Home Medications    Prior to Admission medications   Medication Sig Start Date End Date Taking? Authorizing Provider  cephALEXin (KEFLEX) 500 MG capsule Take 1 capsule (500 mg total) by mouth 2 (two) times daily. 07/19/22  Yes Volney American, PA-C  erythromycin ophthalmic ointment Place a 1/2 inch ribbon of ointment into the right lower eyelid BID prn. 07/19/22  Yes Volney American, PA-C  allopurinol (ZYLOPRIM) 300 MG tablet Take 300 mg by mouth daily.     [provider]  amLODipine (NORVASC) 5 MG tablet TAKE ONE TABLET BY MOUTH DAILY 01/10/20   Camnitz, Ocie Doyne, MD  buPROPion (WELLBUTRIN XL) 150 MG 24 hr tablet Take 150 mg by mouth daily.    [provider]  Calcium Carb-Cholecalciferol (CALCIUM + D3 PO) Take 1 tablet by mouth daily at 3 pm.     [provider]  carvedilol (COREG) 25 MG tablet TAKE ONE TABLET BY MOUTH TWICE DAILY. TAKE WITH A MEAL. Please make yearly appt with Dr. Curt Bears for February 2022 for future refills. Thank you 1st attempt 05/01/20   Constance Haw, MD  CINNAMON PO Take 350 mg by mouth daily at 3 pm.     [provider]  Coenzyme Q10 200 MG capsule Take 400 mg by mouth daily in the afternoon.     [provider]  dofetilide (TIKOSYN) 250 MCG capsule Take 1 capsule (250 mcg total) by mouth 2 (two) times daily. 03/16/22   Camnitz, Ocie Doyne, MD  Ferrous Gluconate-C-Folic Acid (IRON-C PO) Take 1 tablet by mouth daily.    [provider]  Glucosamine-Chondroitin (GLUCOSAMINE CHONDR COMPLEX PO) Take 1 tablet by mouth daily.     [provider]  LUTEIN-ZEAXANTHIN PO Take 2 tablets by mouth daily in the afternoon.     [provider]  Multiple Vitamins-Minerals (MULTIVITAMIN PO) Take 1 tablet by mouth daily at 3 pm.     [provider]  olmesartan (BENICAR) 40 MG tablet Take 1 tablet (40 mg total) by mouth daily. 05/01/20   Verta Ellen., NP  Omega-3 Fatty Acids (OMEGA 3 PO) Take 1,350 mg by mouth daily.    [provider]  PHOSPHATIDYLCHOLINE PO Take 2 tablets by mouth daily in the afternoon.     [provider]  Probiotic Product (PROBIOTIC DAILY PO) Take 1 tablet by mouth daily in the afternoon.    [provider]  RESVERATROL 100 MG CAPS Take 100 mg by mouth daily at 3 pm.     [provider]  rivaroxaban (XARELTO) 20 MG TABS tablet Take 1 tablet (20 mg total) by mouth daily with  supper. 09/16/21   Camnitz, Ocie Doyne, MD  tamsulosin (FLOMAX) 0.4 MG CAPS capsule Take 0.4 mg by mouth daily.  08/06/15   [provider]    Family History Family History  Problem Relation Age of Onset   Lung cancer Father    Cancer - Lung Father    Atrial fibrillation Mother    Dementia Mother    Transient ischemic attack Mother    Colon polyps Mother    Hypertension Other    Liver cancer Brother    Colon cancer Maternal Uncle        diagnosed in his late 76s or early 79s    Social History Social History   Tobacco Use   Smoking status: Never   Smokeless tobacco: Never  Vaping Use   Vaping Use: Never used  Substance Use Topics   Alcohol use: Never    Alcohol/week: 0.0 standard drinks of alcohol   Drug use: Never     Allergies   Antihistamines, diphenhydramine-type and Azithromycin   Review of Systems Review of Systems PER HPI  Physical Exam Triage Vital Signs ED Triage Vitals [07/19/22 1211]  Enc Vitals Group     BP 126/73     Pulse Rate (!) 59     Resp 20     Temp 98 F (36.7 C)     Temp Source Oral     SpO2 96 %     Weight       Height      Head Circumference      Peak Flow      Pain Score 0     Pain Loc      Pain Edu?      Excl. in [place]?    No data found.  Updated Vital Signs BP 126/73 (BP Location: Right Arm)   Pulse (!) 59   Temp 98 F (36.7 C) (Oral)   Resp 20   SpO2 96%   Visual Acuity Right Eye Distance: 20/30 Left Eye Distance: 20/30 Bilateral Distance: 20/16 (does not wear glasses or contacts. denies injury.)  Right Eye Near:   Left Eye Near:    Bilateral Near:     Physical Exam Vitals and nursing note reviewed.  Constitutional:      Appearance: Normal appearance.  HENT:     Head: Atraumatic.     Mouth/Throat:     Mouth: Mucous membranes are moist.  Eyes:     Extraocular Movements: Extraocular movements intact.     Pupils: Pupils are equal, round, and reactive to light.     Comments: Mild right conjunctival erythema and injection, right lower eyelid erythematous, edematous extending down about 2 cm from the lash line.  Tender to palpation but no fluctuance, induration  Cardiovascular:     Rate and Rhythm: Normal rate and regular rhythm.  Pulmonary:     Effort: Pulmonary effort is normal.     Breath sounds: Normal breath sounds.  Musculoskeletal:        General: Normal range of motion.     Cervical back: Normal range of motion and neck supple.  Skin:    General: Skin is warm and dry.  Neurological:     General: No focal deficit present.     Mental Status: He is oriented to person, place, and time.  Psychiatric:        Mood and Affect: Mood normal.        Thought Content: Thought content normal.  Judgment: Judgment normal.      UC Treatments / Results  Labs (all labs ordered are listed, but only abnormal results are displayed) Labs Reviewed - No data to display  EKG   Radiology No results found.  Procedures Procedures (including critical care time)  Medications Ordered in UC Medications - No data to display  Initial Impression / Assessment and Plan / UC  Course  I have reviewed the triage vital signs and the nursing notes.  Pertinent labs & imaging results that were available during my care of the patient were reviewed by me and considered in my medical decision making (see chart for details).     Vitals and exam overall reassuring today, visual acuity declined as vision intact.  Will treat with oral Keflex and erythromycin ointment, warm compresses, good hand hygiene.  Follow-up for any worsening symptoms.  Final Clinical Impressions(s) / UC Diagnoses   Final diagnoses:  Blepharitis of right lower eyelid, unspecified type  Facial cellulitis     Discharge Instructions      Apply warm compresses, make sure to wash your hands before and after touching the area, take the full course of oral antibiotics and use the ointment until fully resolved.  Follow-up for worsening symptoms.    ED Prescriptions     Medication Sig Dispense Auth. Provider   cephALEXin (KEFLEX) 500 MG capsule Take 1 capsule (500 mg total) by mouth 2 (two) times daily. 14 capsule Volney American, [place]   erythromycin ophthalmic ointment Place a 1/2 inch ribbon of ointment into the right lower eyelid BID prn. 3.5 g Volney American, [place]      PDMP not reviewed this encounter.   Volney American, [place] 07/19/22 1231

## 2022-07-19 NOTE — ED Triage Notes (Addendum)
Pt reports skin under right eye tender and swollen on Tuesday/Wednesday. Pt reports redness and discomfort started yesterday in eye.   Pt denies any visual changes until "looks down", pt reports vision then goes blurry until pt looks up again.

## 2022-08-03 ENCOUNTER — Ambulatory Visit (HOSPITAL_COMMUNITY)
Admission: RE | Admit: 2022-08-03 | Discharge: 2022-08-03 | Disposition: A | Discharge status: Home / Self Care | Payer: Medicare Other | Source: Ambulatory Visit | Attending: Physician Assistant | Admitting: Physician Assistant

## 2022-08-03 VITALS — BP 132/74 | HR 61 | Ht 74.0 in | Wt 266.4 lb

## 2022-08-03 DIAGNOSIS — I4819 Other persistent atrial fibrillation: Secondary | ICD-10-CM | POA: Insufficient documentation

## 2022-08-03 DIAGNOSIS — I10 Essential (primary) hypertension: Secondary | ICD-10-CM | POA: Insufficient documentation

## 2022-08-03 DIAGNOSIS — D6869 Other thrombophilia: Secondary | ICD-10-CM | POA: Insufficient documentation

## 2022-08-03 DIAGNOSIS — Z7901 Long term (current) use of anticoagulants: Secondary | ICD-10-CM | POA: Diagnosis not present

## 2022-08-03 DIAGNOSIS — Z79899 Other long term (current) drug therapy: Secondary | ICD-10-CM | POA: Diagnosis not present

## 2022-08-03 LAB — BASIC METABOLIC PANEL
Anion gap: 10 (ref 5–15)
BUN: 19 mg/dL (ref 8–23)
CO2: 22 mmol/L (ref 22–32)
Calcium: 8.6 mg/dL — ABNORMAL LOW (ref 8.9–10.3)
Chloride: 109 mmol/L (ref 98–111)
Creatinine, Ser: 0.94 mg/dL (ref 0.61–1.24)
GFR, Estimated: >60 mL/min (ref >60)
Glucose, Bld: 130 mg/dL — ABNORMAL HIGH (ref 70–99)
Potassium: 3.9 mmol/L (ref 3.5–5.1)
Sodium: 141 mmol/L (ref 135–145)

## 2022-08-03 LAB — MAGNESIUM: Magnesium: 2.1 mg/dL (ref 1.7–2.4)

## 2022-08-03 NOTE — Progress Notes (Signed)
Primary Care Physician: Glenda Chroman, MD Referring Physician: Dr. Curt Bears Primary EP: Dr Ladora Kurtis is a 72 y.o. male with a h/o atrial flutter, atrial fibrillation, HTN who presents for follow up in the [facility]. Patient is s/p afib ablation [id number] complicated by R phrenic nerve palsy. Patient is on Xarelto for a CHA2DS2VASc score of 4. Patient has been maintained on dofetilide 250 mcg BID.   Most recent OV with Dr. Curt Bears on 01/30/22 compliant with tikosyn and Xarelto. Stable QT with no changes made to regimen.   On follow up today, he is doing well overall. No recent AF episodes. He describes a brief 1 hr episode of AF several weeks ago that resolved on its own. No bleeding concerns and he is compliant with both medications. He is trying to walk more on a daily basis.  Today, he denies symptoms of palpitations, chest pain, shortness of breath, orthopnea, PND, lower extremity edema, dizziness, presyncope, syncope, or neurologic sequela. The patient is tolerating medications without difficulties and is otherwise without complaint today.   Past Medical History:  Diagnosis Date   Arthritis    "knuckles maybe" (02/28/2018)   Atrial fib/flutter, transient    a. s/p TEE-guided DCCV on 12/14/2017 with return to NSR.    BPH (benign prostatic hyperplasia) 12/11/2017   CHF (congestive heart failure) ([place])    Echo in November 2020 with EF 45-50% mild LVH, global hypokinesis.   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   Pneumonia 11/2017   Thyroid nodule    Benign   Past Surgical History:  Procedure Laterality Date   ATRIAL FIBRILLATION ABLATION N/A 04/04/2019   Procedure: ATRIAL FIBRILLATION ABLATION;  Surgeon: Constance Haw, MD;  Location: [facility];  Service: Cardiovascular;  Laterality: N/A;   BACK SURGERY     BIOPSY  THYROID     CARDIOVERSION N/A 12/14/2017   Procedure: CARDIOVERSION;  Surgeon: Satira Sark, MD;  [facility];  Service: Cardiovascular;  Laterality: N/A;   CARDIOVERSION N/A 01/14/2019   Procedure: CARDIOVERSION;  Surgeon: Constance Haw, MD;  Location: [facility];  Service: Cardiovascular;  Laterality: N/A;   CATARACT EXTRACTION W/ INTRAOCULAR LENS  IMPLANT, BILATERAL Bilateral 2006-2010   "right-left"   COLONOSCOPY  12/30/2010   [facility]; Dr. Anthony Sar; normal colonoscopy.  Repeat in 10 years.   COLONOSCOPY WITH PROPOFOL N/A 08/01/2020   Procedure: COLONOSCOPY WITH PROPOFOL;  Surgeon: Daneil Dolin, MD;  [facility];  Service: Endoscopy;  Laterality: N/A;  AM-pt requests as early as possible   IRRIGATION AND DEBRIDEMENT SEBACEOUS CYST     LUMBAR LAMINECTOMY N/A 01/20/2013   Procedure: MICRODISCECTOMY LUMBAR LAMINECTOMY;  Surgeon: Marybelle Killings, MD;  Location: [place];  Service: Orthopedics;  Laterality: N/A;  L4-5 Decompression   PILONIDAL CYST DRAINAGE     POLYPECTOMY  08/01/2020   Procedure: POLYPECTOMY INTESTINAL;  Surgeon: Daneil Dolin, MD;  [facility];  Service: Endoscopy;;  splenic flexure colon polyp;    TEE WITHOUT CARDIOVERSION N/A 12/14/2017   Procedure: TRANSESOPHAGEAL ECHOCARDIOGRAM (TEE) WITH PROPOFOL;  Surgeon: Satira Sark, MD;  Location[facility];  Service: Cardiovascular;  Laterality: N/A;    Current Outpatient Medications  Medication Sig Dispense Refill   allopurinol (ZYLOPRIM) 300 MG tablet Take  300 mg by mouth daily.      amLODipine (NORVASC) 5 MG tablet TAKE ONE TABLET BY MOUTH DAILY 30 tablet 0   buPROPion (WELLBUTRIN XL) 150 MG 24 hr tablet Take 150 mg by mouth daily.     Calcium Carb-Cholecalciferol (CALCIUM + D3 PO) Take 1 tablet by mouth daily at 3 pm.      carvedilol (COREG) 25 MG tablet TAKE ONE TABLET BY MOUTH TWICE DAILY. TAKE WITH A MEAL. Please make yearly appt with Dr. Curt Bears for February 2022  for future refills. Thank you 1st attempt 90 tablet 0   CINNAMON PO Take 350 mg by mouth daily at 3 pm.      Coenzyme Q10 200 MG capsule Take 400 mg by mouth daily in the afternoon.      dofetilide (TIKOSYN) 250 MCG capsule Take 1 capsule (250 mcg total) by mouth 2 (two) times daily. 180 capsule 3   Ferrous Gluconate-C-Folic Acid (IRON-C PO) Take 1 tablet by mouth daily.     Glucosamine-Chondroitin (GLUCOSAMINE CHONDR COMPLEX PO) Take 1 tablet by mouth daily.      LUTEIN-ZEAXANTHIN PO Take 2 tablets by mouth daily in the afternoon.      Multiple Vitamins-Minerals (MULTIVITAMIN PO) Take 1 tablet by mouth daily at 3 pm.      olmesartan (BENICAR) 40 MG tablet Take 1 tablet (40 mg total) by mouth daily. 15 tablet 0   Omega-3 Fatty Acids (OMEGA 3 PO) Take 1,350 mg by mouth daily.     PHOSPHATIDYLCHOLINE PO Take 2 tablets by mouth daily in the afternoon.      Probiotic Product (PROBIOTIC DAILY PO) Take 1 tablet by mouth daily in the afternoon.     RESVERATROL 100 MG CAPS Take 100 mg by mouth daily at 3 pm.      rivaroxaban (XARELTO) 20 MG TABS tablet Take 1 tablet (20 mg total) by mouth daily with supper. 90 tablet 3   tamsulosin (FLOMAX) 0.4 MG CAPS capsule Take 0.4 mg by mouth daily.   5   No current facility-administered medications for this encounter.    Allergies  Allergen Reactions   Antihistamines, Diphenhydramine-Type     Pt reports "can't take anything with a D on the end of it due to heart medication."   Azithromycin     QT WAVE    Social History   Socioeconomic History   Marital status: Married    Spouse name: Not on file   Number of children: 3   Years of education: Not on file   Highest education level: Not on file  Occupational History   Not on file  Tobacco Use   Smoking status: Never   Smokeless tobacco: Never  Vaping Use   Vaping Use: Never used  Substance and Sexual Activity   Alcohol use: Never    Alcohol/week: 0.0 standard drinks of alcohol   Drug use: Never    Sexual activity: Not Currently  Other Topics Concern   Not on file  Social History Narrative   Not on file   Social Determinants of Health   Financial Resource Strain: Not on file  Food Insecurity: Not on file  Transportation Needs: Not on file  Physical Activity: Not on file  Stress: Not on file  Social Connections: Not on file  Intimate Partner Violence: Not on file    Family History  Problem Relation Age of Onset   Lung cancer Father    Cancer - Lung Father    Atrial fibrillation  Mother    Dementia Mother    Transient ischemic attack Mother    Colon polyps Mother    Hypertension Other    Liver cancer Brother    Colon cancer Maternal Uncle        diagnosed in his late 33s or early 57s    ROS- All systems are reviewed and negative except as per the HPI above  Physical Exam: Vitals:   08/03/22 0834  BP: 132/74  Pulse: 61  Weight: 120.8 kg  Height: '6\' 2"'$  (1.88 m)    Wt Readings from Last 3 Encounters:  08/03/22 120.8 kg  01/30/22 119.3 kg  07/31/21 117.8 kg    Labs: Lab Results  Component Value Date   NA 143 01/30/2022   K 4.2 01/30/2022   CL 106 01/30/2022   CO2 25 01/30/2022   GLUCOSE 100 (H) 01/30/2022   BUN 23 01/30/2022   CREATININE 0.87 01/30/2022   CALCIUM 8.9 01/30/2022   PHOS 2.3 (L) 04/06/2019   MG 2.2 01/30/2022   Lab Results  Component Value Date   INR 2.3 (H) 07/08/2021   No results found for: "CHOL", "HDL", "LDLCALC", "TRIG"   GEN- The patient is a well appearing  male, alert and oriented x 3 today.   HEENT-head normocephalic, atraumatic, sclera clear, conjunctiva pink, hearing intact, trachea midline. Lungs- Clear to ausculation bilaterally, normal work of breathing Heart- Regular rate and rhythm, no murmurs, rubs or gallops  GI- soft, NT, ND, + BS Extremities- no clubbing, cyanosis, or edema MS- no significant deformity or atrophy Skin- no rash or lesion Psych- euthymic mood, full affect Neuro- strength and sensation are  intact   EKG today demonstrates SR HR 61 bpm QT 440 ms Qtc 444 ms   CHA2DS2-VASc Score = 3  The patient's score is based upon: CHF History: 1 HTN History: 1 Diabetes History: 0 Stroke History: 0 Vascular Disease History: 0 Age Score: 1 Gender Score: 0       ASSESSMENT AND PLAN: Persistent Atrial Fibrillation (ICD10:  I48.19) The patient's CHA2DS2-VASc score is 3, indicating a 3.2% annual risk of stroke.    He is in SR today and doing well overall. We will make no changes to his current regimen.   S/p afib ablation 04/04/19  Continue dofetilide 250 mcg BID. QT stable. Check bmet/mag today. Continue carvedilol 25 mg BID Continue Xarelto 20 mg daily  2. Secondary Hypercoagulable State (ICD10:  D68.69) The patient is at significant risk for stroke/thromboembolism based upon his CHA2DS2-VASc Score of 3.  Continue Rivaroxaban (Xarelto).   3. HTN Stable, no changes today. Continue current regimen   Follow up with Dr. Curt Bears in 6 months; f/u Afib clinic in 1 year.   [facility] [address] [place], [place] [address] [phone]

## 2022-08-11 DIAGNOSIS — I429 Cardiomyopathy, unspecified: Secondary | ICD-10-CM | POA: Diagnosis not present

## 2022-08-11 DIAGNOSIS — D696 Thrombocytopenia, unspecified: Secondary | ICD-10-CM | POA: Diagnosis not present

## 2022-08-11 DIAGNOSIS — Z0000 Encounter for general adult medical examination without abnormal findings: Secondary | ICD-10-CM | POA: Diagnosis not present

## 2022-08-11 DIAGNOSIS — D6869 Other thrombophilia: Secondary | ICD-10-CM | POA: Diagnosis not present

## 2022-08-11 DIAGNOSIS — I10 Essential (primary) hypertension: Secondary | ICD-10-CM | POA: Diagnosis not present

## 2022-08-11 DIAGNOSIS — Z7189 Other specified counseling: Secondary | ICD-10-CM | POA: Diagnosis not present

## 2022-08-11 DIAGNOSIS — Z299 Encounter for prophylactic measures, unspecified: Secondary | ICD-10-CM | POA: Diagnosis not present

## 2022-08-19 DIAGNOSIS — H04123 Dry eye syndrome of bilateral lacrimal glands: Secondary | ICD-10-CM | POA: Diagnosis not present

## 2022-10-30 DIAGNOSIS — I4891 Unspecified atrial fibrillation: Secondary | ICD-10-CM | POA: Diagnosis not present

## 2022-10-30 DIAGNOSIS — R0981 Nasal congestion: Secondary | ICD-10-CM | POA: Diagnosis not present

## 2022-10-30 DIAGNOSIS — Z299 Encounter for prophylactic measures, unspecified: Secondary | ICD-10-CM | POA: Diagnosis not present

## 2022-10-30 DIAGNOSIS — J069 Acute upper respiratory infection, unspecified: Secondary | ICD-10-CM | POA: Diagnosis not present

## 2022-11-12 DIAGNOSIS — D696 Thrombocytopenia, unspecified: Secondary | ICD-10-CM | POA: Diagnosis not present

## 2022-11-12 DIAGNOSIS — I429 Cardiomyopathy, unspecified: Secondary | ICD-10-CM | POA: Diagnosis not present

## 2022-11-12 DIAGNOSIS — I10 Essential (primary) hypertension: Secondary | ICD-10-CM | POA: Diagnosis not present

## 2022-11-12 DIAGNOSIS — I700 Atherosclerosis of aorta: Secondary | ICD-10-CM | POA: Diagnosis not present

## 2022-11-12 DIAGNOSIS — Z299 Encounter for prophylactic measures, unspecified: Secondary | ICD-10-CM | POA: Diagnosis not present

## 2022-11-12 DIAGNOSIS — D6869 Other thrombophilia: Secondary | ICD-10-CM | POA: Diagnosis not present

## 2023-02-15 ENCOUNTER — Ambulatory Visit: Payer: Medicare Other | Attending: Cardiology | Admitting: Cardiology

## 2023-02-15 ENCOUNTER — Encounter: Payer: Self-pay | Admitting: Cardiology

## 2023-02-15 VITALS — BP 118/80 | HR 59 | Ht 74.0 in | Wt 267.6 lb

## 2023-02-15 DIAGNOSIS — I4819 Other persistent atrial fibrillation: Secondary | ICD-10-CM | POA: Diagnosis not present

## 2023-02-15 DIAGNOSIS — Z79899 Other long term (current) drug therapy: Secondary | ICD-10-CM | POA: Diagnosis not present

## 2023-02-15 LAB — BASIC METABOLIC PANEL
BUN/Creatinine Ratio: 19 (ref 10–24)
BUN: 20 mg/dL (ref 8–27)
CO2: 27 mmol/L (ref 20–29)
Calcium: 8.5 mg/dL — ABNORMAL LOW (ref 8.6–10.2)
Chloride: 106 mmol/L (ref 96–106)
Creatinine, Ser: 1.03 mg/dL (ref 0.76–1.27)
Glucose: 106 mg/dL — ABNORMAL HIGH (ref 70–99)
Potassium: 4.1 mmol/L (ref 3.5–5.2)
Sodium: 142 mmol/L (ref 134–144)
eGFR: 77 mL/min/{1.73_m2} (ref >59)

## 2023-02-15 LAB — MAGNESIUM: Magnesium: 2.1 mg/dL (ref 1.6–2.3)

## 2023-02-15 NOTE — Patient Instructions (Addendum)
Medication Instructions:  Your physician recommends that you continue on your current medications as directed. Please refer to the Current Medication list given to you today.  *If you need a refill on your cardiac medications before your next appointment, please call your pharmacy*   Lab Work: Tikosyn surveillance labs today: BMET & Magnesium level If you have labs (blood work) drawn today and your tests are completely normal, you will receive your results only by: MyChart Message (if you have MyChart) OR A paper copy in the mail If you have any lab test that is abnormal or we need to change your treatment, we will call you to review the results.   Testing/Procedures: None ordered   Follow-Up: At [facility], you and your health needs are our priority.  As part of our continuing mission to provide you with exceptional heart care, we have created designated Provider Care Teams.  These Care Teams include your primary Cardiologist (physician) and Advanced Practice Providers (APPs -  Physician Assistants and Nurse Practitioners) who all work together to provide you with the care you need, when you need it.  We recommend signing up for the patient portal called "MyChart".  Sign up information is provided on this After Visit Summary.  MyChart is used to connect with patients for Virtual Visits (Telemedicine).  Patients are able to view lab/test results, encounter notes, upcoming appointments, etc.  Non-urgent messages can be sent to your provider as well.   To learn more about what you can do with MyChart, go to [email or web address].    Your next appointment:   6 month(s)  The format for your next appointment:   In Person  Provider:   You will follow up in the Atrial Fibrillation Clinic located at [facility]. Your provider will be: Clint R. Fenton, PA-C{ Or Landry Mellow, PA-C   Thank you for choosing [facility]!!   Dory Horn, RN [phone]

## 2023-02-15 NOTE — Progress Notes (Signed)
Electrophysiology Office Note:   Date:  02/15/2023  ID:  GURJEET MAVITY, DOB [birth year], MRN [id number]  Primary Cardiologist: None Electrophysiologist: Beulah Matusek Jorja Loa, MD      History of Present Illness:   [name] is a 72 y.o. male with h/o atrial flutter, atrial fibrillation, hypertension seen today for routine electrophysiology followup.   Since last being seen in our clinic the patient reports doing overall well.  He has noted no further episodes of atrial fibrillation.  He has had a few episodes of palpitations that lasted seconds at a time.  Aside from that, he has no major complaints.  he denies chest pain, palpitations, dyspnea, PND, orthopnea, nausea, vomiting, dizziness, syncope, edema, weight gain, or early satiety.   Review of systems complete and found to be negative unless listed in HPI.   EP Information / Studies Reviewed:    EKG is ordered today. Personal review as below.  EKG Interpretation Date/Time:  Monday February 15 2023 10:42:07 EDT Ventricular Rate:  59 PR Interval:  190 QRS Duration:  96 QT Interval:  468 QTC Calculation: 463 R Axis:   7  Text Interpretation: Sinus bradycardia Nonspecific T wave abnormality Prolonged QT When compared with ECG of 03-Aug-2022 08:53, No significant change was found Confirmed by Charmika Macdonnell ([id number]) on 02/15/2023 10:43:41 AM     Risk Assessment/Calculations:    CHA2DS2-VASc Score = 3   This indicates a 3.2% annual risk of stroke. The patient's score is based upon: CHF History: 1 HTN History: 1 Diabetes History: 0 Stroke History: 0 Vascular Disease History: 0 Age Score: 1 Gender Score: 0             Physical Exam:   VS:  BP 118/80 (BP Location: Left Arm, Patient Position: Sitting, Cuff Size: Large)   Pulse (!) 59   Ht 6\' 2"  (1.88 m)   Wt 267 lb 9.6 oz (121.4 kg)   SpO2 95%   BMI 34.36 kg/m    Wt Readings from Last 3 Encounters:  02/15/23 267 lb 9.6 oz (121.4 kg)  08/03/22 266 lb 6.4 oz  (120.8 kg)  01/30/22 263 lb (119.3 kg)     GEN: Well nourished, well developed in no acute distress NECK: No JVD; No carotid bruits CARDIAC: Regular rate and rhythm, no murmurs, rubs, gallops RESPIRATORY:  Clear to auscultation without rales, wheezing or rhonchi  ABDOMEN: Soft, non-tender, non-distended EXTREMITIES:  No edema; No deformity   ASSESSMENT AND PLAN:    1.  Persistent atrial fibrillation: Currently on dofetilide, carvedilol.  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  Today he feels well.  He is in normal rhythm.  Karianna Gusman continue with current management.  2.  Secondary hypercoagulable state: Currently on Xarelto for atrial fibrillation  3.  Hypertension: Currently well-controlled  4.  High risk medication monitoring: Currently on dofetilide.  Deshanda Molitor check BMP and magnesium today   Follow up with [facility] Clinic in 6 months  Signed, Corrinna Karapetyan Jorja Loa, MD

## 2023-03-03 ENCOUNTER — Other Ambulatory Visit: Payer: Self-pay | Admitting: Cardiology

## 2023-03-08 DIAGNOSIS — E7800 Pure hypercholesterolemia, unspecified: Secondary | ICD-10-CM | POA: Diagnosis not present

## 2023-03-08 DIAGNOSIS — R5383 Other fatigue: Secondary | ICD-10-CM | POA: Diagnosis not present

## 2023-03-08 DIAGNOSIS — I10 Essential (primary) hypertension: Secondary | ICD-10-CM | POA: Diagnosis not present

## 2023-03-08 DIAGNOSIS — Z0000 Encounter for general adult medical examination without abnormal findings: Secondary | ICD-10-CM | POA: Diagnosis not present

## 2023-03-08 DIAGNOSIS — Z299 Encounter for prophylactic measures, unspecified: Secondary | ICD-10-CM | POA: Diagnosis not present

## 2023-03-08 DIAGNOSIS — Z23 Encounter for immunization: Secondary | ICD-10-CM | POA: Diagnosis not present

## 2023-03-08 DIAGNOSIS — Z79899 Other long term (current) drug therapy: Secondary | ICD-10-CM | POA: Diagnosis not present

## 2023-03-22 DIAGNOSIS — B079 Viral wart, unspecified: Secondary | ICD-10-CM | POA: Diagnosis not present

## 2023-03-22 DIAGNOSIS — Z299 Encounter for prophylactic measures, unspecified: Secondary | ICD-10-CM | POA: Diagnosis not present

## 2023-03-22 DIAGNOSIS — I10 Essential (primary) hypertension: Secondary | ICD-10-CM | POA: Diagnosis not present

## 2023-03-22 DIAGNOSIS — D485 Neoplasm of uncertain behavior of skin: Secondary | ICD-10-CM | POA: Diagnosis not present

## 2023-04-02 DIAGNOSIS — I4891 Unspecified atrial fibrillation: Secondary | ICD-10-CM | POA: Diagnosis not present

## 2023-04-02 DIAGNOSIS — I10 Essential (primary) hypertension: Secondary | ICD-10-CM | POA: Diagnosis not present

## 2023-04-02 DIAGNOSIS — M79604 Pain in right leg: Secondary | ICD-10-CM | POA: Diagnosis not present

## 2023-04-02 DIAGNOSIS — D692 Other nonthrombocytopenic purpura: Secondary | ICD-10-CM | POA: Diagnosis not present

## 2023-04-06 DIAGNOSIS — Z299 Encounter for prophylactic measures, unspecified: Secondary | ICD-10-CM | POA: Diagnosis not present

## 2023-04-06 DIAGNOSIS — B079 Viral wart, unspecified: Secondary | ICD-10-CM | POA: Diagnosis not present

## 2023-04-06 DIAGNOSIS — I10 Essential (primary) hypertension: Secondary | ICD-10-CM | POA: Diagnosis not present

## 2023-04-08 DIAGNOSIS — M79604 Pain in right leg: Secondary | ICD-10-CM | POA: Diagnosis not present

## 2023-04-12 DIAGNOSIS — M79604 Pain in right leg: Secondary | ICD-10-CM | POA: Diagnosis not present

## 2023-04-14 ENCOUNTER — Other Ambulatory Visit: Payer: Self-pay | Admitting: Cardiology

## 2023-04-14 DIAGNOSIS — M79604 Pain in right leg: Secondary | ICD-10-CM | POA: Diagnosis not present

## 2023-04-15 ENCOUNTER — Emergency Department (HOSPITAL_COMMUNITY): Payer: Medicare Other

## 2023-04-15 ENCOUNTER — Other Ambulatory Visit: Payer: Self-pay

## 2023-04-15 ENCOUNTER — Other Ambulatory Visit: Payer: Self-pay | Admitting: Cardiology

## 2023-04-15 ENCOUNTER — Encounter (HOSPITAL_COMMUNITY): Payer: Self-pay

## 2023-04-15 ENCOUNTER — Emergency Department (HOSPITAL_COMMUNITY)
Admission: EM | Admit: 2023-04-15 | Discharge: 2023-04-15 | Disposition: A | Discharge status: Home / Self Care | Payer: Medicare Other

## 2023-04-15 DIAGNOSIS — W19XXXA Unspecified fall, initial encounter: Secondary | ICD-10-CM

## 2023-04-15 DIAGNOSIS — S199XXA Unspecified injury of neck, initial encounter: Secondary | ICD-10-CM | POA: Diagnosis not present

## 2023-04-15 DIAGNOSIS — I4891 Unspecified atrial fibrillation: Secondary | ICD-10-CM | POA: Insufficient documentation

## 2023-04-15 DIAGNOSIS — Z7901 Long term (current) use of anticoagulants: Secondary | ICD-10-CM | POA: Diagnosis not present

## 2023-04-15 DIAGNOSIS — S0990XA Unspecified injury of head, initial encounter: Secondary | ICD-10-CM | POA: Diagnosis not present

## 2023-04-15 DIAGNOSIS — W133XXA Fall through floor, initial encounter: Secondary | ICD-10-CM | POA: Diagnosis not present

## 2023-04-15 DIAGNOSIS — Z79899 Other long term (current) drug therapy: Secondary | ICD-10-CM | POA: Diagnosis not present

## 2023-04-15 DIAGNOSIS — E041 Nontoxic single thyroid nodule: Secondary | ICD-10-CM | POA: Diagnosis not present

## 2023-04-15 LAB — I-STAT CHEM 8, ED
BUN: 19 mg/dL (ref 8–23)
Calcium, Ion: 1.08 mmol/L — ABNORMAL LOW (ref 1.15–1.40)
Chloride: 106 mmol/L (ref 98–111)
Creatinine, Ser: 0.9 mg/dL (ref 0.61–1.24)
Glucose, Bld: 111 mg/dL — ABNORMAL HIGH (ref 70–99)
HCT: 41 % (ref 39.0–52.0)
Hemoglobin: 13.9 g/dL (ref 13.0–17.0)
Potassium: 3.9 mmol/L (ref 3.5–5.1)
Sodium: 142 mmol/L (ref 135–145)
TCO2: 25 mmol/L (ref 22–32)

## 2023-04-15 LAB — CBC WITH DIFFERENTIAL/PLATELET
Abs Immature Granulocytes: 0.02 10*3/uL (ref 0.00–0.07)
Basophils Absolute: 0 10*3/uL (ref 0.0–0.1)
Basophils Relative: 0 %
Eosinophils Absolute: 0.1 10*3/uL (ref 0.0–0.5)
Eosinophils Relative: 2 %
HCT: 41.4 % (ref 39.0–52.0)
Hemoglobin: 13.4 g/dL (ref 13.0–17.0)
Immature Granulocytes: 0 %
Lymphocytes Relative: 24 %
Lymphs Abs: 1.7 10*3/uL (ref 0.7–4.0)
MCH: 29.3 pg (ref 26.0–34.0)
MCHC: 32.4 g/dL (ref 30.0–36.0)
MCV: 90.6 fL (ref 80.0–100.0)
Monocytes Absolute: 0.6 10*3/uL (ref 0.1–1.0)
Monocytes Relative: 8 %
Neutro Abs: 4.4 10*3/uL (ref 1.7–7.7)
Neutrophils Relative %: 66 %
Platelets: 125 10*3/uL — ABNORMAL LOW (ref 150–400)
RBC: 4.57 MIL/uL (ref 4.22–5.81)
RDW: 13.1 % (ref 11.5–15.5)
WBC: 6.8 10*3/uL (ref 4.0–10.5)
nRBC: 0 % (ref 0.0–0.2)

## 2023-04-15 MED ORDER — DOFETILIDE 250 MCG PO CAPS: 250.0000 ug | ORAL_CAPSULE | Freq: Two times a day (BID) | ORAL | 1 refills | Status: DC

## 2023-04-15 NOTE — ED Notes (Addendum)
Dahlia Client, RN to CT 4 with pt

## 2023-04-15 NOTE — Progress Notes (Signed)
   04/15/23 1703  Spiritual Encounters  Type of Visit Initial  Care provided to: Pt and family  Conversation partners present during encounter Physician  Reason for visit Trauma  OnCall Visit No   Responded to fall on thinner. Patient alert and talking. Patient's family present. Per family member patient currently not needing a chaplain visit, however will alert chaplain office if change their mind.

## 2023-04-15 NOTE — Progress Notes (Signed)
Orthopedic Tech Progress Note Patient Details:  [name] 03-Jul-1950 [id number]  Level II trauma, no ortho tech orders at this time.  Patient ID: [name], male   DOB: 03/21/1951, 72 y.o.   MRN: [id number]  [name] 04/15/2023, 4:45 PM

## 2023-04-15 NOTE — ED Provider Notes (Signed)
[place] EMERGENCY DEPARTMENT AT [facility] Provider Note   CSN: [id number] Arrival date & time: 04/15/23  1622     History  Chief Complaint  Patient presents with   [name] is a 72 y.o. male.  72 year old male with past medical history of A-fib on Xarelto as well as thrombocytopenia presenting to the emergency department today with a closed head injury.  The patient tripped and fell getting out of car.  Did hit the right side of his head.  He states that he did have a mild headache at first.  He states that this has since resolved.  He denies any neck pain or back pain.  Denies any chest pain or shortness of breath.  He denies any pain in his extremities.  The patient was ambulatory after this.        Home Medications Prior to Admission medications   Medication Sig Start Date End Date Taking? Authorizing Provider  allopurinol (ZYLOPRIM) 300 MG tablet Take 300 mg by mouth daily.     [provider]  amLODipine (NORVASC) 5 MG tablet TAKE ONE TABLET BY MOUTH DAILY 01/10/20   Camnitz, Andree Coss, MD  buPROPion (WELLBUTRIN XL) 150 MG 24 hr tablet Take 150 mg by mouth daily.    [provider]  Calcium Carb-Cholecalciferol (CALCIUM + D3 PO) Take 1 tablet by mouth daily at 3 pm.     [provider]  carvedilol (COREG) 25 MG tablet TAKE ONE TABLET BY MOUTH TWICE DAILY. TAKE WITH A MEAL. Please make yearly appt with Dr. Elberta Fortis for February 2022 for future refills. Thank you 1st attempt 05/01/20   Regan Lemming, MD  CINNAMON PO Take 350 mg by mouth daily at 3 pm.     [provider]  Coenzyme Q10 200 MG capsule Take 400 mg by mouth daily in the afternoon.     [provider]  dofetilide (TIKOSYN) 250 MCG capsule Take 1 capsule (250 mcg total) by mouth 2 (two) times daily. 04/15/23   Camnitz, Andree Coss, MD  Ferrous Gluconate-C-Folic Acid (IRON-C PO) Take 1 tablet by mouth daily.    [provider]   Glucosamine-Chondroitin (GLUCOSAMINE CHONDR COMPLEX PO) Take 1 tablet by mouth daily.     [provider]  LUTEIN-ZEAXANTHIN PO Take 2 tablets by mouth daily in the afternoon.     [provider]  Multiple Vitamins-Minerals (MULTIVITAMIN PO) Take 1 tablet by mouth daily at 3 pm.     [provider]  olmesartan (BENICAR) 40 MG tablet Take 1 tablet (40 mg total) by mouth daily. 05/01/20   Netta Neat., NP  Omega-3 Fatty Acids (OMEGA 3 PO) Take 1,350 mg by mouth daily.    [provider]  PHOSPHATIDYLCHOLINE PO Take 2 tablets by mouth daily in the afternoon.     [provider]  Probiotic Product (PROBIOTIC DAILY PO) Take 1 tablet by mouth daily in the afternoon.    [provider]  RESVERATROL 100 MG CAPS Take 100 mg by mouth daily at 3 pm.     [provider]  rivaroxaban (XARELTO) 20 MG TABS tablet Take 1 tablet (20 mg total) by mouth daily with supper. 09/16/21   Camnitz, Andree Coss, MD  tamsulosin (FLOMAX) 0.4 MG CAPS capsule Take 0.4 mg by mouth daily.  08/06/15   [provider]      Allergies    Antihistamines, diphenhydramine-type and Azithromycin  Review of Systems   Review of Systems  Neurological:  Positive for headaches.  All other systems reviewed and are negative.   Physical Exam Updated Vital Signs BP 130/64   Pulse (!) 58   Temp 97.7 F (36.5 C) (Oral)   Resp 12   Ht 6\' 2"  (1.88 m)   Wt 121 kg   SpO2 99%   BMI 34.25 kg/m  Physical Exam Vitals and nursing note reviewed.   Gen: NAD Eyes: PERRL, EOMI, small hematoma noted over the right occipital region HEENT: no oropharyngeal swelling Neck: trachea midline, no midline tenderness Resp: clear to auscultation bilaterally Card: RRR, no murmurs, rubs, or gallops Abd: nontender, nondistended Extremities: no calf tenderness, no edema Vascular: 2+ radial pulses bilaterally, 2+ DP pulses bilaterally Skin: no rashes Psyc: acting  appropriately   ED Results / Procedures / Treatments   Labs (all labs ordered are listed, but only abnormal results are displayed) Labs Reviewed  CBC WITH DIFFERENTIAL/PLATELET - Abnormal; Notable for the following components:      Result Value   Platelets 125 (*)    All other components within normal limits  I-STAT CHEM 8, ED - Abnormal; Notable for the following components:   Glucose, Bld 111 (*)    Calcium, Ion 1.08 (*)    All other components within normal limits  TYPE AND SCREEN    EKG None  Radiology CT Head Wo Contrast  Result Date: 04/15/2023 CLINICAL DATA:  Head trauma, minor (Age >= 65y); Neck trauma (Age >= 65y). EXAM: CT HEAD WITHOUT CONTRAST CT CERVICAL SPINE WITHOUT CONTRAST TECHNIQUE: Multidetector CT imaging of the head and cervical spine was performed following the standard protocol without intravenous contrast. Multiplanar CT image reconstructions of the cervical spine were also generated. RADIATION DOSE REDUCTION: This exam was performed according to the departmental dose-optimization program which includes automated exposure control, adjustment of the mA and/or kV according to patient size and/or use of iterative reconstruction technique. COMPARISON:  CT head 07/08/2021 FINDINGS: CT HEAD FINDINGS Brain: There is no evidence of an acute infarct, intracranial hemorrhage, mass, midline shift, or extra-axial fluid collection. Cerebral volume is normal for age with normal size of the ventricles. Vascular: No hyperdense vessel. Skull: No acute fracture or suspicious osseous lesion. Sinuses/Orbits: Mild mucosal thickening in the left maxillary sinus. Clear mastoid air cells. Bilateral cataract extraction. Other: None. CT CERVICAL SPINE FINDINGS Alignment: Straightening of the normal cervical lordosis. Left convex curvature of the lower cervical spine. No significant listhesis. Skull base and vertebrae: No acute fracture or suspicious osseous lesion. Soft tissues and spinal  canal: No prevertebral fluid or swelling. No visible canal hematoma. Disc levels: Moderate disc degeneration from C3-4 through C5-6. Asymmetrically advanced facet arthrosis on the left at C3-4 and on the right at C7-T1. At least moderate neural foraminal stenosis on the left at C3-4 and on the right at C5-6. No evidence of high-grade spinal canal stenosis. Upper chest: Clear lung apices. Aberrant right subclavian artery, a normal variant. Other: 2 cm left thyroid nodule which has been evaluated previously by ultrasound and biopsy. IMPRESSION: 1. No evidence of acute intracranial abnormality. 2. No acute cervical spine fracture. Electronically Signed   By: Sebastian Ache M.D.   On: 04/15/2023 18:09   CT Cervical Spine Wo Contrast  Result Date: 04/15/2023 CLINICAL DATA:  Head trauma, minor (Age >= 65y); Neck trauma (Age >= 65y). EXAM: CT HEAD WITHOUT CONTRAST CT CERVICAL SPINE WITHOUT CONTRAST TECHNIQUE: Multidetector CT imaging of the head and cervical  spine was performed following the standard protocol without intravenous contrast. Multiplanar CT image reconstructions of the cervical spine were also generated. RADIATION DOSE REDUCTION: This exam was performed according to the departmental dose-optimization program which includes automated exposure control, adjustment of the mA and/or kV according to patient size and/or use of iterative reconstruction technique. COMPARISON:  CT head 07/08/2021 FINDINGS: CT HEAD FINDINGS Brain: There is no evidence of an acute infarct, intracranial hemorrhage, mass, midline shift, or extra-axial fluid collection. Cerebral volume is normal for age with normal size of the ventricles. Vascular: No hyperdense vessel. Skull: No acute fracture or suspicious osseous lesion. Sinuses/Orbits: Mild mucosal thickening in the left maxillary sinus. Clear mastoid air cells. Bilateral cataract extraction. Other: None. CT CERVICAL SPINE FINDINGS Alignment: Straightening of the normal cervical  lordosis. Left convex curvature of the lower cervical spine. No significant listhesis. Skull base and vertebrae: No acute fracture or suspicious osseous lesion. Soft tissues and spinal canal: No prevertebral fluid or swelling. No visible canal hematoma. Disc levels: Moderate disc degeneration from C3-4 through C5-6. Asymmetrically advanced facet arthrosis on the left at C3-4 and on the right at C7-T1. At least moderate neural foraminal stenosis on the left at C3-4 and on the right at C5-6. No evidence of high-grade spinal canal stenosis. Upper chest: Clear lung apices. Aberrant right subclavian artery, a normal variant. Other: 2 cm left thyroid nodule which has been evaluated previously by ultrasound and biopsy. IMPRESSION: 1. No evidence of acute intracranial abnormality. 2. No acute cervical spine fracture. Electronically Signed   By: Sebastian Ache M.D.   On: 04/15/2023 18:09    Procedures Procedures    Medications Ordered in ED Medications - No data to display  ED Course/ Medical Decision Making/ A&P                                 Medical Decision Making 72 year old male with past medical history of atrial fibrillation on Xarelto as well as thrombocytopenia presents the emergency department today after a closed head injury.  On further evaluate patient here with a CT scan of his head and cervical spine.  I do not think that additional x-rays are warranted at this time.  I will obtain basic labs and a type and screen given his history of thrombocytopenia.  I will reevaluate for ultimate disposition.  The patient's labs here are reassuring.  His CT scans are negative.  He is discharged with return precautions.  Amount and/or Complexity of Data Reviewed Labs: ordered. Radiology: ordered.           Final Clinical Impression(s) / ED Diagnoses Final diagnoses:  Fall, initial encounter    Rx / DC Orders ED Discharge Orders     None         Durwin Glaze, MD 04/15/23 [id number]

## 2023-04-15 NOTE — ED Triage Notes (Signed)
Patient arrives POV after a mechanical fall from standing. Patient endorses getting out of car and standing "close to his daughter's car, so when trying to get out his feet got twisted up." Patient fell and struck right shoulder before striking head on concrete driveway. Patient is on xarelto, last dose yesterday 11/20 2100. Patient arrives to ED ambulatory and GCS 15.

## 2023-04-15 NOTE — Discharge Instructions (Signed)
You were seen here today for a fall.  Your CT scan showed no broken bones in your head or neck or bleeding in your head.  Please use all medication as previously prescribed and follow-up with your PCP. Please return to the ED for chest pain, shortness of breath, loss of consciousness, abnormal behavior, or any worsening symptom or concern.

## 2023-04-19 DIAGNOSIS — M79604 Pain in right leg: Secondary | ICD-10-CM | POA: Diagnosis not present

## 2023-04-21 DIAGNOSIS — S0990XS Unspecified injury of head, sequela: Secondary | ICD-10-CM | POA: Diagnosis not present

## 2023-04-21 DIAGNOSIS — M79604 Pain in right leg: Secondary | ICD-10-CM | POA: Diagnosis not present

## 2023-04-21 DIAGNOSIS — Z299 Encounter for prophylactic measures, unspecified: Secondary | ICD-10-CM | POA: Diagnosis not present

## 2023-04-21 DIAGNOSIS — I10 Essential (primary) hypertension: Secondary | ICD-10-CM | POA: Diagnosis not present

## 2023-04-21 DIAGNOSIS — Z9181 History of falling: Secondary | ICD-10-CM | POA: Diagnosis not present

## 2023-04-26 DIAGNOSIS — M79604 Pain in right leg: Secondary | ICD-10-CM | POA: Diagnosis not present

## 2023-04-29 DIAGNOSIS — M79604 Pain in right leg: Secondary | ICD-10-CM | POA: Diagnosis not present

## 2023-05-03 DIAGNOSIS — M79604 Pain in right leg: Secondary | ICD-10-CM | POA: Diagnosis not present

## 2023-05-05 DIAGNOSIS — M79604 Pain in right leg: Secondary | ICD-10-CM | POA: Diagnosis not present

## 2023-05-10 ENCOUNTER — Other Ambulatory Visit (HOSPITAL_COMMUNITY): Payer: Self-pay | Admitting: Internal Medicine

## 2023-05-10 DIAGNOSIS — K760 Fatty (change of) liver, not elsewhere classified: Secondary | ICD-10-CM

## 2023-05-10 DIAGNOSIS — I10 Essential (primary) hypertension: Secondary | ICD-10-CM | POA: Diagnosis not present

## 2023-05-10 DIAGNOSIS — D696 Thrombocytopenia, unspecified: Secondary | ICD-10-CM

## 2023-05-10 DIAGNOSIS — Z299 Encounter for prophylactic measures, unspecified: Secondary | ICD-10-CM | POA: Diagnosis not present

## 2023-05-10 DIAGNOSIS — M79604 Pain in right leg: Secondary | ICD-10-CM | POA: Diagnosis not present

## 2023-05-12 DIAGNOSIS — M79604 Pain in right leg: Secondary | ICD-10-CM | POA: Diagnosis not present

## 2023-05-13 ENCOUNTER — Ambulatory Visit (HOSPITAL_COMMUNITY)
Admission: RE | Admit: 2023-05-13 | Discharge: 2023-05-13 | Disposition: A | Discharge status: Home / Self Care | Payer: Medicare Other | Source: Ambulatory Visit | Attending: Internal Medicine | Admitting: Internal Medicine

## 2023-05-13 DIAGNOSIS — K760 Fatty (change of) liver, not elsewhere classified: Secondary | ICD-10-CM | POA: Diagnosis not present

## 2023-05-13 DIAGNOSIS — D696 Thrombocytopenia, unspecified: Secondary | ICD-10-CM | POA: Insufficient documentation

## 2023-05-13 DIAGNOSIS — K838 Other specified diseases of biliary tract: Secondary | ICD-10-CM | POA: Diagnosis not present

## 2023-05-17 DIAGNOSIS — R609 Edema, unspecified: Secondary | ICD-10-CM | POA: Diagnosis not present

## 2023-05-17 DIAGNOSIS — L309 Dermatitis, unspecified: Secondary | ICD-10-CM | POA: Diagnosis not present

## 2023-05-17 DIAGNOSIS — Z299 Encounter for prophylactic measures, unspecified: Secondary | ICD-10-CM | POA: Diagnosis not present

## 2023-05-17 DIAGNOSIS — R21 Rash and other nonspecific skin eruption: Secondary | ICD-10-CM | POA: Diagnosis not present

## 2023-05-17 DIAGNOSIS — M79604 Pain in right leg: Secondary | ICD-10-CM | POA: Diagnosis not present

## 2023-05-24 DIAGNOSIS — M79604 Pain in right leg: Secondary | ICD-10-CM | POA: Diagnosis not present

## 2023-06-03 DIAGNOSIS — M79604 Pain in right leg: Secondary | ICD-10-CM | POA: Diagnosis not present

## 2023-06-09 DIAGNOSIS — M79604 Pain in right leg: Secondary | ICD-10-CM | POA: Diagnosis not present

## 2023-06-10 DIAGNOSIS — M79604 Pain in right leg: Secondary | ICD-10-CM | POA: Diagnosis not present

## 2023-06-14 DIAGNOSIS — M79604 Pain in right leg: Secondary | ICD-10-CM | POA: Diagnosis not present

## 2023-06-16 DIAGNOSIS — M79604 Pain in right leg: Secondary | ICD-10-CM | POA: Diagnosis not present

## 2023-06-21 DIAGNOSIS — M79604 Pain in right leg: Secondary | ICD-10-CM | POA: Diagnosis not present

## 2023-06-25 DIAGNOSIS — M79604 Pain in right leg: Secondary | ICD-10-CM | POA: Diagnosis not present

## 2023-06-29 ENCOUNTER — Encounter (INDEPENDENT_AMBULATORY_CARE_PROVIDER_SITE_OTHER): Payer: Self-pay | Admitting: *Deleted

## 2023-08-16 ENCOUNTER — Ambulatory Visit (HOSPITAL_COMMUNITY)
Admission: RE | Admit: 2023-08-16 | Discharge: 2023-08-16 | Disposition: A | Discharge status: Home / Self Care | Payer: Medicare Other | Source: Ambulatory Visit | Attending: Internal Medicine | Admitting: Internal Medicine

## 2023-08-16 VITALS — BP 132/76 | HR 58 | Ht 74.0 in | Wt 262.2 lb

## 2023-08-16 DIAGNOSIS — I110 Hypertensive heart disease with heart failure: Secondary | ICD-10-CM | POA: Insufficient documentation

## 2023-08-16 DIAGNOSIS — Z79899 Other long term (current) drug therapy: Secondary | ICD-10-CM | POA: Insufficient documentation

## 2023-08-16 DIAGNOSIS — Z5181 Encounter for therapeutic drug level monitoring: Secondary | ICD-10-CM | POA: Insufficient documentation

## 2023-08-16 DIAGNOSIS — I509 Heart failure, unspecified: Secondary | ICD-10-CM | POA: Insufficient documentation

## 2023-08-16 DIAGNOSIS — I4819 Other persistent atrial fibrillation: Secondary | ICD-10-CM | POA: Insufficient documentation

## 2023-08-16 DIAGNOSIS — D6869 Other thrombophilia: Secondary | ICD-10-CM | POA: Insufficient documentation

## 2023-08-16 DIAGNOSIS — Z7901 Long term (current) use of anticoagulants: Secondary | ICD-10-CM | POA: Insufficient documentation

## 2023-08-16 LAB — BASIC METABOLIC PANEL
Anion gap: 8 (ref 5–15)
BUN: 15 mg/dL (ref 8–23)
CO2: 26 mmol/L (ref 22–32)
Calcium: 8.5 mg/dL — ABNORMAL LOW (ref 8.9–10.3)
Chloride: 106 mmol/L (ref 98–111)
Creatinine, Ser: 0.91 mg/dL (ref 0.61–1.24)
GFR, Estimated: >60 mL/min (ref >60)
Glucose, Bld: 104 mg/dL — ABNORMAL HIGH (ref 70–99)
Potassium: 3.8 mmol/L (ref 3.5–5.1)
Sodium: 140 mmol/L (ref 135–145)

## 2023-08-16 LAB — MAGNESIUM: Magnesium: 2.1 mg/dL (ref 1.7–2.4)

## 2023-08-16 NOTE — Progress Notes (Signed)
 Primary Care Physician: Ignatius Specking, MD Referring Physician: Dr. Elberta Fortis Primary EP: Dr Eugene Gavia is a 73 y.o. male with a h/o atrial flutter, atrial fibrillation, HTN who presents for follow up in the [facility]. Patient is s/p afib ablation 06/30/19 complicated by R phrenic nerve palsy. Patient is on Xarelto for a CHA2DS2VASc score of 4. Patient has been maintained on dofetilide 250 mcg BID.   On follow up 08/16/23, patient is here for Tikosyn surveillance. He is currently in NSR. He has had no episodes of Afib since last office visit with Dr. Elberta Fortis. No missed doses of Tikosyn or Xarelto.  Today, he denies symptoms of palpitations, chest pain, shortness of breath, orthopnea, PND, lower extremity edema, dizziness, presyncope, syncope, or neurologic sequela. The patient is tolerating medications without difficulties and is otherwise without complaint today.   Past Medical History:  Diagnosis Date   Arthritis    "knuckles maybe" (02/28/2018)   Atrial fib/flutter, transient (HCC)    a. s/p TEE-guided DCCV on 12/14/2017 with return to NSR.    BPH (benign prostatic hyperplasia) 12/11/2017   CHF (congestive heart failure) (HCC)    Echo in November 2020 with EF 45-50% mild LVH, global hypokinesis.   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   Pneumonia 11/2017   Thyroid nodule    Benign   Past Surgical History:  Procedure Laterality Date   ATRIAL FIBRILLATION ABLATION N/A 04/04/2019   Procedure: ATRIAL FIBRILLATION ABLATION;  Surgeon: Regan Lemming, MD;  Location: [facility] INVASIVE CV LAB;  Service: Cardiovascular;  Laterality: N/A;   BACK SURGERY     BIOPSY THYROID     CARDIOVERSION N/A 12/14/2017   Procedure: CARDIOVERSION;  Surgeon: Jonelle Sidle, MD;  Location: [facility] ORS;  Service: Cardiovascular;  Laterality: N/A;   CARDIOVERSION  N/A 01/14/2019   Procedure: CARDIOVERSION;  Surgeon: Regan Lemming, MD;  Location: [facility] ENDOSCOPY;  Service: Cardiovascular;  Laterality: N/A;   CATARACT EXTRACTION W/ INTRAOCULAR LENS  IMPLANT, BILATERAL Bilateral 2006-2010   "right-left"   COLONOSCOPY  12/30/2010   [facility]; Dr. Gabriel Cirri; normal colonoscopy.  Repeat in 10 years.   COLONOSCOPY WITH PROPOFOL N/A 08/01/2020   Procedure: COLONOSCOPY WITH PROPOFOL;  Surgeon: Corbin Ade, MD;  Location: [facility] ENDO SUITE;  Service: Endoscopy;  Laterality: N/A;  AM-pt requests as early as possible   IRRIGATION AND DEBRIDEMENT SEBACEOUS CYST     LUMBAR LAMINECTOMY N/A 01/20/2013   Procedure: MICRODISCECTOMY LUMBAR LAMINECTOMY;  Surgeon: Eldred Manges, MD;  Location: [facility] OR;  Service: Orthopedics;  Laterality: N/A;  L4-5 Decompression   PILONIDAL CYST DRAINAGE     POLYPECTOMY  08/01/2020   Procedure: POLYPECTOMY INTESTINAL;  Surgeon: Corbin Ade, MD;  Location: [facility] ENDO SUITE;  Service: Endoscopy;;  splenic flexure colon polyp;    TEE WITHOUT CARDIOVERSION N/A 12/14/2017   Procedure: TRANSESOPHAGEAL ECHOCARDIOGRAM (TEE) WITH PROPOFOL;  Surgeon: Jonelle Sidle, MD;  Location: [facility] ORS;  Service: Cardiovascular;  Laterality: N/A;    Current Outpatient Medications  Medication Sig Dispense Refill   allopurinol (ZYLOPRIM) 300 MG tablet Take 300 mg by mouth daily.      amLODipine (NORVASC) 5 MG tablet TAKE ONE TABLET BY MOUTH DAILY 30 tablet 0   buPROPion (WELLBUTRIN XL) 150 MG 24 hr tablet Take 150  mg by mouth daily.     Calcium Carb-Cholecalciferol (CALCIUM + D3 PO) Take 1 tablet by mouth daily at 3 pm.      carvedilol (COREG) 25 MG tablet TAKE ONE TABLET BY MOUTH TWICE DAILY. TAKE WITH A MEAL. Please make yearly appt with Dr. Elberta Fortis for February 2022 for future refills. Thank you 1st attempt 90 tablet 0   CINNAMON PO Take 350 mg by mouth daily at 3 pm.      Coenzyme Q10 200 MG capsule Take 400 mg by mouth daily in the afternoon.       dofetilide (TIKOSYN) 250 MCG capsule Take 1 capsule (250 mcg total) by mouth 2 (two) times daily. 180 capsule 1   Ferrous Gluconate-C-Folic Acid (IRON-C PO) Take 1 tablet by mouth daily.     furosemide (LASIX) 40 MG tablet Take 40 mg by mouth daily as needed.     Glucosamine-Chondroitin (GLUCOSAMINE CHONDR COMPLEX PO) Take 1 tablet by mouth daily.      LUTEIN-ZEAXANTHIN PO Take 2 tablets by mouth daily in the afternoon.      Multiple Vitamins-Minerals (MULTIVITAMIN PO) Take 1 tablet by mouth daily at 3 pm.      olmesartan (BENICAR) 40 MG tablet Take 1 tablet (40 mg total) by mouth daily. 15 tablet 0   Omega-3 Fatty Acids (OMEGA 3 PO) Take 1,350 mg by mouth daily.     PHOSPHATIDYLCHOLINE PO Take 2 tablets by mouth daily in the afternoon.      potassium chloride (KLOR-CON) 10 MEQ tablet Take 10 mEq by mouth daily as needed.     Probiotic Product (PROBIOTIC DAILY PO) Take 1 tablet by mouth daily in the afternoon.     RESVERATROL 100 MG CAPS Take 100 mg by mouth daily at 3 pm.      rivaroxaban (XARELTO) 20 MG TABS tablet Take 1 tablet (20 mg total) by mouth daily with supper. 90 tablet 3   tamsulosin (FLOMAX) 0.4 MG CAPS capsule Take 0.4 mg by mouth daily.   5   No current facility-administered medications for this encounter.    Allergies  Allergen Reactions   Antihistamines, Diphenhydramine-Type     Pt reports "can't take anything with a D on the end of it due to heart medication."   Azithromycin     QT WAVE   ROS- All systems are reviewed and negative except as per the HPI above  Physical Exam: Vitals:   08/16/23 0904  BP: 132/76  Pulse: (!) 58  Weight: 118.9 kg  Height: 6\' 2"  (1.88 m)     Wt Readings from Last 3 Encounters:  08/16/23 118.9 kg  04/15/23 121 kg  02/15/23 121.4 kg    Labs: Lab Results  Component Value Date   NA 142 04/15/2023   K 3.9 04/15/2023   CL 106 04/15/2023   CO2 27 02/15/2023   GLUCOSE 111 (H) 04/15/2023   BUN 19 04/15/2023   CREATININE  0.90 04/15/2023   CALCIUM 8.5 (L) 02/15/2023   PHOS 2.3 (L) 04/06/2019   MG 2.1 02/15/2023   Lab Results  Component Value Date   INR 2.3 (H) 07/08/2021   No results found for: "CHOL", "HDL", "LDLCALC", "TRIG"  GEN- The patient is well appearing, alert and oriented x 3 today.   Neck - no JVD or carotid bruit noted Lungs- Clear to ausculation bilaterally, normal work of breathing Heart- Regular rate and rhythm, no murmurs, rubs or gallops, PMI not laterally displaced Extremities- no clubbing, cyanosis, or edema  Skin - no rash or ecchymosis noted   EKG today demonstrates Vent. rate 58 BPM PR interval 202 ms QRS duration 98 ms QT/QTcB 448/439 ms P-R-T axes 118 15 6 Sinus bradycardia Nonspecific T wave abnormality Abnormal ECG When compared with ECG of 15-Feb-2023 10:42, PREVIOUS ECG IS PRESENT  Echocardiogram 04/06/2019: 1. Left ventricular ejection fraction, by visual estimation, is 45 to  50%. The left ventricle has mildly decreased function. There is mildly  increased left ventricular hypertrophy.   2. The left ventricle demonstrates global hypokinesis.   3. Left ventricular diastolic parameters are consistent with Grade I  diastolic dysfunction (impaired relaxation).   4. Global right ventricle has normal systolic function.The right  ventricular size is normal. No increase in right ventricular wall  thickness.   5. Left atrial size was mildly dilated.   6. Right atrial size was normal.   7. Presence of pericardial fat pad.   8. The pericardial effusion is circumferential.   9. Trivial pericardial effusion is present.  10. The mitral valve is grossly normal. Mild mitral valve regurgitation.  No evidence of mitral stenosis.  11. The tricuspid valve is grossly normal. Tricuspid valve regurgitation  is trivial.  12. The aortic valve is tricuspid. Aortic valve regurgitation is not  visualized. No evidence of aortic valve sclerosis or stenosis.  13. The pulmonic valve  was grossly normal. Pulmonic valve regurgitation is  trivial.  14. TR signal is inadequate for assessing pulmonary artery systolic  pressure.     CHA2DS2-VASc Score = 4  The patient's score is based upon: CHF History: 1 HTN History: 1 Diabetes History: 0 Stroke History: 0 Vascular Disease History: 1 Age Score: 1 Gender Score: 0       ASSESSMENT AND PLAN: Persistent Atrial Fibrillation (ICD10:  I48.19) The patient's CHA2DS2-VASc score is 4, indicating a 4.8% annual risk of stroke.   S/p Afib ablation on 04/04/2019.  He is currently in NSR.  High risk medication monitoring (ICD10: R7229428) Patient requires ongoing monitoring for anti-arrhythmic medication which has the potential to cause life threatening arrhythmias or AV block. Qtc stable. Continue Tikosyn 250 mcg BID. Bmet and mag drawn today.   2. Secondary Hypercoagulable State (ICD10:  D68.69) The patient is at significant risk for stroke/thromboembolism based upon his CHA2DS2-VASc Score of 4.  Continue Rivaroxaban (Xarelto).  No missed doses   3. HTN Stable today.   Follow up with Dr. Elberta Fortis in 6 months for Tikosyn surveillance.   Justin Mend, PA-C Af[facility] [address] [place], [place] [address] [phone]

## 2023-08-16 NOTE — Patient Instructions (Signed)
Follow up with Dr Elberta Fortis in 6 months

## 2023-08-23 DIAGNOSIS — H43393 Other vitreous opacities, bilateral: Secondary | ICD-10-CM | POA: Diagnosis not present

## 2023-08-25 DIAGNOSIS — I10 Essential (primary) hypertension: Secondary | ICD-10-CM | POA: Diagnosis not present

## 2023-08-25 DIAGNOSIS — Z7189 Other specified counseling: Secondary | ICD-10-CM | POA: Diagnosis not present

## 2023-08-25 DIAGNOSIS — Z0000 Encounter for general adult medical examination without abnormal findings: Secondary | ICD-10-CM | POA: Diagnosis not present

## 2023-08-25 DIAGNOSIS — Z713 Dietary counseling and surveillance: Secondary | ICD-10-CM | POA: Diagnosis not present

## 2023-08-25 DIAGNOSIS — I700 Atherosclerosis of aorta: Secondary | ICD-10-CM | POA: Diagnosis not present

## 2023-08-25 DIAGNOSIS — Z299 Encounter for prophylactic measures, unspecified: Secondary | ICD-10-CM | POA: Diagnosis not present

## 2023-09-20 ENCOUNTER — Encounter: Payer: Self-pay | Admitting: Internal Medicine

## 2023-09-20 ENCOUNTER — Telehealth: Payer: Self-pay | Admitting: *Deleted

## 2023-09-20 ENCOUNTER — Ambulatory Visit: Admitting: Internal Medicine

## 2023-09-20 VITALS — BP 134/72 | HR 60 | Temp 98.5°F | Ht 74.0 in | Wt 265.4 lb

## 2023-09-20 DIAGNOSIS — Z860101 Personal history of adenomatous and serrated colon polyps: Secondary | ICD-10-CM | POA: Diagnosis not present

## 2023-09-20 DIAGNOSIS — Z860100 Personal history of colon polyps, unspecified: Secondary | ICD-10-CM

## 2023-09-20 DIAGNOSIS — Z09 Encounter for follow-up examination after completed treatment for conditions other than malignant neoplasm: Secondary | ICD-10-CM | POA: Diagnosis not present

## 2023-09-20 NOTE — Telephone Encounter (Signed)
  Request for patient to stop medication prior to procedure or is needing cleareance  09/20/23  [name] Half May 16, 1951  What type of surgery is being performed? Colonoscopy  When is surgery scheduled? TBS  What type of clearance is required (medical or pharmacy to hold medication or both? medication  Are there any medications that need to be held prior to surgery and how long? Xarelto   x 2 days   Name of physician performing surgery?  Dr. Loa Riling [facility] Phone: [phone] Fax: [phone]  Anethesia type (none, local, MAC, general)? MAC

## 2023-09-20 NOTE — Progress Notes (Unsigned)
 Primary Care Physician:  Orlena Bitters, MD Primary Gastroenterologist:  Dr. Riley Cheadle  Pre-Procedure History & Physical: HPI:  [name] is a 73 y.o. male here to set up a surveillance colonoscopy.  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  Clinically, he is done well for the past 3 years.  On Xarelto  for atrial fibrillation.  He is not having any GI symptoms.  Past Medical History:  Diagnosis Date   Arthritis    "knuckles maybe" (02/28/2018)   Atrial fib/flutter, transient (HCC)    a. s/p TEE-guided DCCV on 12/14/2017 with return to [facility].    BPH (benign prostatic hyperplasia) 12/11/2017   CHF (congestive heart failure) (HCC)    Echo in November 2020 with EF 45-50% mild LVH, global hypokinesis.   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   Pneumonia 11/2017   Thyroid  nodule    Benign    Past Surgical History:  Procedure Laterality Date   ATRIAL FIBRILLATION ABLATION N/A 04/04/2019   Procedure: ATRIAL FIBRILLATION ABLATION;  Surgeon: Lei Pump, MD;  Location: [facility];  Service: Cardiovascular;  Laterality: N/A;   BACK SURGERY     BIOPSY THYROID      CARDIOVERSION N/A 12/14/2017   Procedure: CARDIOVERSION;  Surgeon: Gerard Knight, MD;  Location: [facility] ORS;  Service: Cardiovascular;  Laterality: N/A;   CARDIOVERSION N/A 01/14/2019   Procedure: CARDIOVERSION;  Surgeon: Lei Pump, MD;  Location: [facility] ENDOSCOPY;  Service: Cardiovascular;  Laterality: N/A;   CATARACT EXTRACTION W/ INTRAOCULAR LENS  IMPLANT, BILATERAL Bilateral 2006-2010   "right-left"   COLONOSCOPY  12/30/2010   [facility]; Dr. DeMason; normal colonoscopy.  Repeat in 10 years.   COLONOSCOPY WITH PROPOFOL  N/A 08/01/2020   Procedure: COLONOSCOPY WITH PROPOFOL ;  Surgeon: Suzette Espy, MD;  Location: [facility] ENDO SUITE;  Service:  Endoscopy;  Laterality: N/A;  AM-pt requests as early as possible   IRRIGATION AND DEBRIDEMENT SEBACEOUS CYST     LUMBAR LAMINECTOMY N/A 01/20/2013   Procedure: MICRODISCECTOMY LUMBAR LAMINECTOMY;  Surgeon: Adah Acron, MD;  Location: [facility] OR;  Service: Orthopedics;  Laterality: N/A;  L4-5 Decompression   PILONIDAL CYST DRAINAGE     POLYPECTOMY  08/01/2020   Procedure: POLYPECTOMY INTESTINAL;  Surgeon: Suzette Espy, MD;  Location: [facility] ENDO SU[facility];  Service: Endoscopy;;  splenic flexure colon polyp;    TEE WITHOUT CARDIOVERSION N/A 12/14/2017   Procedure: TRANSESOPHAGEAL ECHOCARDIOGRAM (TEE) WITH PROPOFOL ;  Surgeon: Gerard Knight, MD;  Location: [facility] ORS;  Service: Cardiovascular;  Laterality: N/A;    Prior to Admission medications   Medication Sig Start Date End Date Taking? Authorizing Provider  allopurinol  (ZYLOPRIM ) 300 MG tablet Take 300 mg by mouth daily.    Yes [provider]  amLODipine  (NORVASC ) 5 MG tablet TAKE ONE TABLET BY MOUTH DAILY 01/10/20  Yes Camnitz, Will Gaylyn Keas, MD  buPROPion (WELLBUTRIN XL) 150 MG 24 hr tablet Take 150 mg by mouth daily.   Yes [provider]  Calcium Carb-Cholecalciferol  (CALCIUM + D3 PO) Take 1 tablet by mouth daily at 3 pm.    Yes [provider]  carvedilol  (COREG ) 25 MG tablet TAKE ONE TABLET BY MOUTH TWICE DAILY. TAKE WITH A MEAL. Please make yearly appt with Dr. Lawana Pray for February 2022 for  future refills. Thank you 1st attempt 05/01/20  Yes Camnitz, Will Gaylyn Keas, MD  CINNAMON PO Take 350 mg by mouth daily at 3 pm.    Yes [provider]  Coenzyme Q10 200 MG capsule Take 400 mg by mouth daily in the afternoon.    Yes [provider]  dofetilide  (TIKOSYN ) 250 MCG capsule Take 1 capsule (250 mcg total) by mouth 2 (two) times daily. 04/15/23  Yes Camnitz, Will Gaylyn Keas, MD  Ferrous Gluconate-C-Folic Acid (IRON-C PO) Take 1 tablet by mouth daily.   Yes [provider]  furosemide  (LASIX ) 40 MG tablet Take  40 mg by mouth daily as needed. 05/17/23  Yes [provider]  Glucosamine-Chondroitin (GLUCOSAMINE CHONDR COMPLEX PO) Take 1 tablet by mouth daily.    Yes [provider]  LUTEIN-ZEAXANTHIN PO Take 2 tablets by mouth daily in the afternoon.    Yes [provider]  Multiple Vitamins-Minerals (MULTIVITAMIN PO) Take 1 tablet by mouth daily at 3 pm.    Yes [provider]  olmesartan  (BENICAR ) 40 MG tablet Take 1 tablet (40 mg total) by mouth daily. 05/01/20  Yes Berlin Breen., NP  Omega-3 Fatty Acids (OMEGA 3 PO) Take 1,350 mg by mouth daily.   Yes [provider]  PHOSPHATIDYLCHOLINE PO Take 2 tablets by mouth daily in the afternoon.    Yes [provider]  potassium chloride  (KLOR-CON ) 10 MEQ tablet Take 10 mEq by mouth daily as needed. 05/17/23  Yes [provider]  Probiotic Product (PROBIOTIC DAILY PO) Take 1 tablet by mouth daily in the afternoon.   Yes [provider]  RESVERATROL 100 MG CAPS Take 100 mg by mouth daily at 3 pm.    Yes [provider]  rivaroxaban  (XARELTO ) 20 MG TABS tablet Take 1 tablet (20 mg total) by mouth daily with supper. 09/16/21  Yes Camnitz, Will Gaylyn Keas, MD  tamsulosin  (FLOMAX ) 0.4 MG CAPS capsule Take 0.4 mg by mouth daily.  08/06/15  Yes [provider]    Allergies as of 09/20/2023 - Review Complete 09/20/2023  Allergen Reaction Noted   Antihistamines, diphenhydramine-type  07/19/2022   Azithromycin  06/29/2018    Family History  Problem Relation Age of Onset   Lung cancer Father    Cancer - Lung Father    Atrial fibrillation Mother    Dementia Mother    Transient ischemic attack Mother    Colon polyps Mother    Hypertension Other    Liver cancer Brother    Colon cancer Maternal Uncle        diagnosed in his late 58s or early 69s    Social History   Socioeconomic History   Marital status: Married    Spouse name: Not on file   Number of children: 3    Years of education: Not on file   Highest education level: Not on file  Occupational History   Not on file  Tobacco Use   Smoking status: Never   Smokeless tobacco: Never  Vaping Use   Vaping status: Never Used  Substance and Sexual Activity   Alcohol  use: Never    Alcohol /week: 0.0 standard drinks of alcohol    Drug use: Never   Sexual activity: Not Currently  Other Topics Concern   Not on file  Social History Narrative   Not on file   Social Drivers of Health   Financial Resource Strain: Not on file  Food Insecurity: Not on file  Transportation Needs: Not on  file  Physical Activity: Not on file  Stress: Not on file  Social Connections: Not on file  Intimate Partner Violence: Not on file    Review of Systems: See HPI, otherwise negative ROS  Physical Exam: BP 134/72 (BP Location: Right Arm, Patient Position: Sitting, Cuff Size: Large)   Pulse 60   Temp 98.5 F (36.9 C) (Oral)   Ht 6\' 2"  (1.88 m)   Wt 265 lb 6.4 oz (120.4 kg)   SpO2 95%   BMI 34.08 kg/m  General:   Alert,  Well-developed, well-nourished, pleasant and cooperative in NAD Neck:  Supple; no masses or thyromegaly. No significant cervical adenopathy. Lungs:  Clear throughout to auscultation.   No wheezes, crackles, or rhonchi. No acute distress. Heart:  Regular rate and rhythm; no murmurs, clicks, rubs,  or gallops. Abdomen: Non-distended, normal bowel sounds.  Soft and nontender without appreciable mass or hepatosplenomegaly.  Pulses:  Normal pulses noted. Extremities:  Without clubbing or edema.  Impression/Plan:  ***     Notice: This dictation was prepared with Dragon dictation along with smaller phrase technology. Any transcriptional errors that result from this process are unintentional and may not be corrected upon review.

## 2023-09-20 NOTE — Patient Instructions (Signed)
 It was good to see you again today!  As discussed, you are due to have a surveillance colonoscopy (history of a tubulovillous adenoma removed previously).  ASA 3.  We will have you stop the Xarelto  for 2 days prior to the procedure (we will check with Dr. Lawana Pray to assure that this is okay as part of our protocol)  Further recommendations to follow.

## 2023-09-21 NOTE — Telephone Encounter (Signed)
 Patient with diagnosis of A Fib on Xarelto  for anticoagulation.    Procedure: colonoscopy Date of procedure: TBD   CHA2DS2-VASc Score = 4  This indicates a 4.8% annual risk of stroke. The patient's score is based upon: CHF History: 1 HTN History: 1 Diabetes History: 0 Stroke History: 0 Vascular Disease History: 1 Age Score: 1 Gender Score: 0    CrCl 121 ml/min Platelet count 125K.    Per office protocol, patient can hold Xarelto  for 2 days prior to procedure.    **This guidance is not considered finalized until pre-operative APP has relayed final recommendations.**

## 2023-09-21 NOTE — Telephone Encounter (Signed)
     Primary Cardiologist: Dr. Lawana Pray  Clinical pharmacist have reviewed patient's chart  as part of pre-operative protocol coverage.  The following recommendations have been provided for, [name]   Patient with diagnosis of A Fib on Xarelto  for anticoagulation.     Procedure: colonoscopy Date of procedure: TBD     CHA2DS2-VASc Score = 4  This indicates a 4.8% annual risk of stroke. The patient's score is based upon: CHF History: 1 HTN History: 1 Diabetes History: 0 Stroke History: 0 Vascular Disease History: 1 Age Score: 1 Gender Score: 0     CrCl 121 ml/min Platelet count 125K.      Per office protocol, patient can hold Xarelto  for 2 days prior to procedure.  I will route this recommendation to the requesting party via Epic fax function and remove from pre-op pool.  Please call with questions.  Chet Cota. Neely Cecena NP-C     09/21/2023, 8:14 AM [facility] [address] Office [phone] Fax [phone]

## 2023-10-04 NOTE — Telephone Encounter (Signed)
 FYI

## 2023-10-06 ENCOUNTER — Other Ambulatory Visit: Payer: Self-pay | Admitting: Cardiology

## 2023-10-06 ENCOUNTER — Other Ambulatory Visit: Payer: Self-pay | Admitting: *Deleted

## 2023-10-06 ENCOUNTER — Encounter: Payer: Self-pay | Admitting: *Deleted

## 2023-10-06 MED ORDER — PEG 3350-KCL-NA BICARB-NACL 420 G PO SOLR: 4000.0000 mL | Freq: Once | ORAL | 0 refills | Status: AC

## 2023-10-06 NOTE — Telephone Encounter (Signed)
 Pt has been scheduled for 11/24/23. Instructions mailed and prep sent to the pharmacy

## 2023-10-06 NOTE — Telephone Encounter (Signed)
 LMOVM to return call  TCS asa 3, Dr.Rourk

## 2023-10-07 ENCOUNTER — Encounter: Payer: Self-pay | Admitting: *Deleted

## 2023-10-28 DIAGNOSIS — I429 Cardiomyopathy, unspecified: Secondary | ICD-10-CM | POA: Diagnosis not present

## 2023-10-28 DIAGNOSIS — Z299 Encounter for prophylactic measures, unspecified: Secondary | ICD-10-CM | POA: Diagnosis not present

## 2023-10-28 DIAGNOSIS — I4891 Unspecified atrial fibrillation: Secondary | ICD-10-CM | POA: Diagnosis not present

## 2023-10-28 DIAGNOSIS — K219 Gastro-esophageal reflux disease without esophagitis: Secondary | ICD-10-CM | POA: Diagnosis not present

## 2023-10-28 DIAGNOSIS — R208 Other disturbances of skin sensation: Secondary | ICD-10-CM | POA: Diagnosis not present

## 2023-11-19 NOTE — Patient Instructions (Signed)
 [name]  11/19/2023     @PREFPERIOPPHARMACY @   Your procedure is scheduled on 11/24/2023.   Report to [facility] at 6:00 A.M.   Call this number if you have problems the morning of surgery:   [phone]  If you experience any cold or flu symptoms such as cough, fever, chills, shortness of breath, etc. between now and your scheduled surgery, please notify us  at the above number.   Remember:  Please Follow the diet and prep instructions given to you by Dr Ivonne office.   You may drink clear liquids until 3:30 AM .  Clear liquids allowed are:                    Water , Juice (No red color; non-citric and without pulp; diabetics please choose diet or no sugar options), Carbonated beverages (diabetics please choose diet or no sugar options), Clear Tea (No creamer, milk, or cream, including half & half and powdered creamer), Black Coffee Only (No creamer, milk or cream, including half & half and powdered creamer), Plain Jell-O Only (No red color; diabetics please choose no sugar options), Clear Sports drink (No red color; diabetics please choose diet or no sugar options), and Plain Popsicles Only (No red color; diabetics please choose no sugar options)    Take these medicines the morning of surgery with A SIP OF WATER  : Norvasc  Wellbutrin Carvedilol  Tikosyn  Coenzyme and Flomax .     Last dose of Xarelto  should be on 11/21/2023.     Do not wear jewelry, make-up or nail polish, including gel polish,  artificial nails, or any other type of covering on natural nails (fingers and  toes).  Do not wear lotions, powders, or perfumes, or deodorant.  Do not shave 48 hours prior to surgery.  Men may shave face and neck.  Do not bring valuables to the hospital.  [facility] is not responsible for any belongings or valuables.  Contacts, dentures or bridgework may not be worn into surgery.  Leave your suitcase in the car.  After surgery it may be brought to your room.  For patients  admitted to the hospital, discharge time will be determined by your treatment team.  Patients discharged the day of surgery will not be allowed to drive home.   Name and phone number of your driver:   Family  Special instructions:  N/A  Please read over the following fact sheets that you were given. Pain Booklet and Care and Recovery After Surgery  Colonoscopy, Adult A colonoscopy is a procedure to look at the entire large intestine. This procedure is done using a long, thin, flexible tube that has a camera on the end. You may have a colonoscopy: As a part of normal colorectal screening. If you have certain symptoms, such as: A low number of red blood cells in your blood (anemia). Diarrhea that does not go away. Pain in your abdomen. Blood in your stool. A colonoscopy can help screen for and diagnose medical problems, including: An abnormal growth of cells or tissue (tumor). Abnormal growths within the lining of your intestine (polyps). Inflammation. Areas of bleeding. Tell your health care provider about: Any allergies you have. All medicines you are taking, including vitamins, herbs, eye drops, creams, and over-the-counter medicines. Any problems you or family members have had with anesthetic medicines. Any bleeding problems you have. Any surgeries you have had. Any medical conditions you have. Any problems you have had with having bowel movements. Whether you  are pregnant or may be pregnant. What are the risks? Generally, this is a safe procedure. However, problems may occur, including: Bleeding. Damage to your intestine. Allergic reactions to medicines given during the procedure. Infection. This is rare. What happens before the procedure? Eating and drinking restrictions Follow instructions from your health care provider about eating or drinking restrictions, which may include: A few days before the procedure: Follow a low-fiber diet. Avoid nuts, seeds, dried fruit,  raw fruits, and vegetables. 1-3 days before the procedure: Eat only gelatin dessert or ice pops. Drink only clear liquids, such as water , clear juice, clear broth or bouillon, black coffee or tea, or clear soft drinks or sports drinks. Avoid liquids that contain red or purple dye. The day of the procedure: Do not eat solid foods. You may continue to drink clear liquids until up to 2 hours before the procedure. Do not eat or drink anything starting 2 hours before the procedure, or within the time period that your health care provider recommends. Bowel prep If you were prescribed a bowel prep to take by mouth (orally) to clean out your colon: Take it as told by your health care provider. Starting the day before your procedure, you will need to drink a large amount of liquid medicine. The liquid will cause you to have many bowel movements of loose stool until your stool becomes almost clear or light green. If your skin or the opening between the buttocks (anus) gets irritated from diarrhea, you may relieve the irritation using: Wipes with medicine in them, such as adult wet wipes with aloe and vitamin E. A product to soothe skin, such as petroleum jelly. If you vomit while drinking the bowel prep: Take a break for up to 60 minutes. Begin the bowel prep again. Call your health care provider if you keep vomiting or you cannot take the bowel prep without vomiting. To clean out your colon, you may also be given: Laxative medicines. These help you have a bowel movement. Instructions for enema use. An enema is liquid medicine injected into your rectum. Medicines Ask your health care provider about: Changing or stopping your regular medicines or supplements. This is especially important if you are taking iron supplements, diabetes medicines, or blood thinners. Taking medicines such as aspirin and ibuprofen. These medicines can thin your blood. Do not take these medicines unless your health care  provider tells you to take them. Taking over-the-counter medicines, vitamins, herbs, and supplements. General instructions Ask your health care provider what steps will be taken to help prevent infection. These may include washing skin with a germ-killing soap. If you will be going home right after the procedure, plan to have a responsible adult: Take you home from the hospital or clinic. You will not be allowed to drive. Care for you for the time you are told. What happens during the procedure?  An IV will be inserted into one of your veins. You will be given a medicine to make you fall asleep (general anesthetic). You will lie on your side with your knees bent. A lubricant will be put on the tube. Then the tube will be: Inserted into your anus. Gently eased through all parts of your large intestine. Air will be sent into your colon to keep it open. This may cause some pressure or cramping. Images will be taken with the camera and will appear on a screen. A small tissue sample may be removed to be looked at under a microscope (biopsy). The tissue  may be sent to a lab for testing if any signs of problems are found. If small polyps are found, they may be removed and checked for cancer cells. When the procedure is finished, the tube will be removed. The procedure may vary among health care providers and hospitals. What happens after the procedure? Your blood pressure, heart rate, breathing rate, and blood oxygen level will be monitored until you leave the hospital or clinic. You may have a small amount of blood in your stool. You may pass gas and have mild cramping or bloating in your abdomen. This is caused by the air that was used to open your colon during the exam. If you were given a sedative during the procedure, it can affect you for several hours. Do not drive or operate machinery until your health care provider says that it is safe. It is up to you to get the results of your  procedure. Ask your health care provider, or the department that is doing the procedure, when your results will be ready. Summary A colonoscopy is a procedure to look at the entire large intestine. Follow instructions from your health care provider about eating and drinking before the procedure. If you were prescribed an oral bowel prep to clean out your colon, take it as told by your health care provider. During the colonoscopy, a flexible tube with a camera on its end is inserted into the anus and then passed into all parts of the large intestine. This information is not intended to replace advice given to you by your health care provider. Make sure you discuss any questions you have with your health care provider. Document Revised: 06/23/2022 Document Reviewed: 01/01/2021 Elsevier Patient Education  2024 [employer]   Monitored Anesthesia Care Anesthesia refers to the techniques, procedures, and medicines that help a person stay safe and comfortable during surgery. Monitored anesthesia care, or sedation, is one type of anesthesia. You may have sedation if you do not need to be asleep for your procedure. Procedures that use sedation may include: Surgery to remove cataracts from your eyes. A dental procedure. A biopsy. This is when a tissue sample is removed and looked at under a microscope. You will be watched closely during your procedure. Your level of sedation or type of anesthesia may be changed to fit your needs. Tell a health care provider about: Any allergies you have. All medicines you are taking, including vitamins, herbs, eye drops, creams, and over-the-counter medicines. Any problems you or family members have had with anesthesia. Any bleeding problems you have. Any surgeries you have had. Any medical conditions or illnesses you have. This includes sleep apnea, cough, fever, or the flu. Whether you are pregnant or may be pregnant. Whether you use cigarettes, alcohol , or  drugs. Any use of steroids, whether by mouth or as a cream. What are the risks? Your health care provider will talk with you about risks. These may include: Getting too much medicine (oversedation). Nausea. Allergic reactions to medicines. Trouble breathing. If this happens, a breathing tube may be used to help you breathe. It will be removed when you are awake and breathing on your own. Heart trouble. Lung trouble. Confusion that gets better with time (emergence delirium). What happens before the procedure? When to stop eating and drinking Follow instructions from your health care provider about what you may eat and drink. These may include: 8 hours before your procedure Stop eating most foods. Do not eat meat, fried foods, or fatty foods.  Eat only light foods, such as toast or crackers. All liquids are okay except energy drinks and alcohol . 6 hours before your procedure Stop eating. Drink only clear liquids, such as water , clear fruit juice, black coffee, plain tea, and sports drinks. Do not drink energy drinks or alcohol . 2 hours before your procedure Stop drinking all liquids. You may be allowed to take medicines with small sips of water . If you do not follow your health care provider's instructions, your procedure may be delayed or canceled. Medicines Ask your health care provider about: Changing or stopping your regular medicines. These include any diabetes medicines or blood thinners you take. Taking medicines such as aspirin and ibuprofen. These medicines can thin your blood. Do not take them unless your health care provider tells you to. Taking over-the-counter medicines, vitamins, herbs, and supplements. Testing You may have an exam or testing. You may have a blood or urine sample taken. General instructions Do not use any products that contain nicotine or tobacco for at least 4 weeks before the procedure. These products include cigarettes, chewing tobacco, and vaping  devices, such as e-cigarettes. If you need help quitting, ask your health care provider. If you will be going home right after the procedure, plan to have a responsible adult: Take you home from the hospital or clinic. You will not be allowed to drive. Care for you for the time you are told. What happens during the procedure?  Your blood pressure, heart rate, breathing, level of pain, and blood oxygen level will be monitored. An IV will be inserted into one of your veins. You may be given: A sedative. This helps you relax. Anesthesia. This will: Numb certain areas of your body. Make you fall asleep for surgery. You will be given medicines as needed to keep you comfortable. The more medicine you are given, the deeper your level of sedation will be. Your level of sedation may be changed to fit your needs. There are three levels of sedation: Mild sedation. At this level, you may feel awake and relaxed. You will be able to follow directions. Moderate sedation. At this level, you will be sleepy. You may not remember the procedure. Deep sedation. At this level, you will be asleep. You will not remember the procedure. How you get the medicines will depend on your age and the procedure. They may be given as: A pill. This may be taken by mouth (orally) or inserted into the rectum. An injection. This may be into a vein or muscle. A spray through the nose. After your procedure is over, the medicine will be stopped. The procedure may vary among health care providers and hospitals. What happens after the procedure? Your blood pressure, heart rate, breathing rate, and blood oxygen level will be monitored until you leave the hospital or clinic. You may feel sleepy, clumsy, or nauseous. You may not remember what happened during or after the procedure. Sedation can affect you for several hours. Do not drive or use machinery until your health care provider says that it is safe. This information is not  intended to replace advice given to you by your health care provider. Make sure you discuss any questions you have with your health care provider. Document Revised: 10/06/2021 Document Reviewed: 10/06/2021 Elsevier Patient Education  2024 [employer].

## 2023-11-22 ENCOUNTER — Encounter (HOSPITAL_COMMUNITY)
Admission: RE | Admit: 2023-11-22 | Discharge: 2023-11-22 | Disposition: A | Discharge status: Home / Self Care | Source: Ambulatory Visit | Attending: Internal Medicine | Admitting: Internal Medicine

## 2023-11-22 ENCOUNTER — Other Ambulatory Visit: Payer: Self-pay

## 2023-11-22 ENCOUNTER — Encounter (HOSPITAL_COMMUNITY): Payer: Self-pay

## 2023-11-22 VITALS — BP 129/60 | HR 56 | Temp 98.2°F | Resp 18 | Ht 74.0 in | Wt 265.0 lb

## 2023-11-22 DIAGNOSIS — Z01818 Encounter for other preprocedural examination: Secondary | ICD-10-CM | POA: Diagnosis present

## 2023-11-22 DIAGNOSIS — I10 Essential (primary) hypertension: Secondary | ICD-10-CM | POA: Insufficient documentation

## 2023-11-22 DIAGNOSIS — R9431 Abnormal electrocardiogram [ECG] [EKG]: Secondary | ICD-10-CM | POA: Insufficient documentation

## 2023-11-22 DIAGNOSIS — Z01810 Encounter for preprocedural cardiovascular examination: Secondary | ICD-10-CM | POA: Diagnosis not present

## 2023-11-24 ENCOUNTER — Ambulatory Visit (HOSPITAL_COMMUNITY)

## 2023-11-24 ENCOUNTER — Encounter (HOSPITAL_COMMUNITY)
Admission: RE | Disposition: A | Discharge status: Home / Self Care | Payer: Self-pay | Source: Home / Self Care | Attending: Internal Medicine

## 2023-11-24 ENCOUNTER — Encounter (HOSPITAL_COMMUNITY): Payer: Self-pay | Admitting: Internal Medicine

## 2023-11-24 ENCOUNTER — Ambulatory Visit (HOSPITAL_COMMUNITY)
Admission: RE | Admit: 2023-11-24 | Discharge: 2023-11-24 | Disposition: A | Discharge status: Home / Self Care | Attending: Internal Medicine | Admitting: Internal Medicine

## 2023-11-24 DIAGNOSIS — Z860100 Personal history of colon polyps, unspecified: Secondary | ICD-10-CM

## 2023-11-24 DIAGNOSIS — K621 Rectal polyp: Secondary | ICD-10-CM | POA: Diagnosis not present

## 2023-11-24 DIAGNOSIS — I110 Hypertensive heart disease with heart failure: Secondary | ICD-10-CM | POA: Diagnosis not present

## 2023-11-24 DIAGNOSIS — I509 Heart failure, unspecified: Secondary | ICD-10-CM | POA: Insufficient documentation

## 2023-11-24 DIAGNOSIS — Z800 Family history of malignant neoplasm of digestive organs: Secondary | ICD-10-CM | POA: Insufficient documentation

## 2023-11-24 DIAGNOSIS — I2510 Atherosclerotic heart disease of native coronary artery without angina pectoris: Secondary | ICD-10-CM | POA: Diagnosis not present

## 2023-11-24 DIAGNOSIS — K5730 Diverticulosis of large intestine without perforation or abscess without bleeding: Secondary | ICD-10-CM | POA: Insufficient documentation

## 2023-11-24 DIAGNOSIS — D122 Benign neoplasm of ascending colon: Secondary | ICD-10-CM | POA: Insufficient documentation

## 2023-11-24 DIAGNOSIS — Z09 Encounter for follow-up examination after completed treatment for conditions other than malignant neoplasm: Secondary | ICD-10-CM | POA: Insufficient documentation

## 2023-11-24 DIAGNOSIS — K5140 Inflammatory polyps of colon without complications: Secondary | ICD-10-CM

## 2023-11-24 DIAGNOSIS — I4892 Unspecified atrial flutter: Secondary | ICD-10-CM | POA: Diagnosis not present

## 2023-11-24 DIAGNOSIS — I4891 Unspecified atrial fibrillation: Secondary | ICD-10-CM | POA: Diagnosis not present

## 2023-11-24 DIAGNOSIS — Z1211 Encounter for screening for malignant neoplasm of colon: Secondary | ICD-10-CM | POA: Diagnosis not present

## 2023-11-24 DIAGNOSIS — K635 Polyp of colon: Secondary | ICD-10-CM | POA: Diagnosis not present

## 2023-11-24 DIAGNOSIS — I10 Essential (primary) hypertension: Secondary | ICD-10-CM | POA: Diagnosis not present

## 2023-11-24 HISTORY — PX: COLONOSCOPY: SHX5424

## 2023-11-24 SURGERY — COLONOSCOPY
Anesthesia: General

## 2023-11-24 MED ORDER — PROPOFOL 500 MG/50ML IV EMUL
INTRAVENOUS | Status: DC | PRN
  Administered 2023-11-24: 150 ug/kg/min via INTRAVENOUS

## 2023-11-24 MED ORDER — LACTATED RINGERS IV SOLN: INTRAVENOUS | Status: DC

## 2023-11-24 MED ORDER — PROPOFOL 10 MG/ML IV BOLUS
INTRAVENOUS | Status: DC | PRN
  Administered 2023-11-24 (×2): 50 mg via INTRAVENOUS

## 2023-11-24 MED ORDER — PHENYLEPHRINE 80 MCG/ML (10ML) SYRINGE FOR IV PUSH (FOR BLOOD PRESSURE SUPPORT)
PREFILLED_SYRINGE | INTRAVENOUS | Status: DC | PRN
Start: 2023-11-24 — End: 2023-11-24
  Administered 2023-11-24: 160 ug via INTRAVENOUS

## 2023-11-24 MED ORDER — EPHEDRINE SULFATE-NACL 50-0.9 MG/10ML-% IV SOSY
PREFILLED_SYRINGE | INTRAVENOUS | Status: DC | PRN
Start: 2023-11-24 — End: 2023-11-24
  Administered 2023-11-24: 10 mg via INTRAVENOUS
  Administered 2023-11-24: 5 mg via INTRAVENOUS
  Administered 2023-11-24: 10 mg via INTRAVENOUS

## 2023-11-24 MED ORDER — DEXMEDETOMIDINE HCL IN NACL 80 MCG/20ML IV SOLN
INTRAVENOUS | Status: DC | PRN
Start: 2023-11-24 — End: 2023-11-24
  Administered 2023-11-24: 20 ug via INTRAVENOUS

## 2023-11-24 NOTE — Anesthesia Preprocedure Evaluation (Addendum)
 Anesthesia Evaluation  Patient identified by MRN, date of birth, ID band Patient awake    Reviewed: Allergy & Precautions, H&P , NPO status , Patient's Chart, lab work & pertinent test results  Airway Mallampati: II  TM Distance: >3 FB Neck ROM: Full    Dental no notable dental hx.    Pulmonary pneumonia   Pulmonary exam normal breath sounds clear to auscultation       Cardiovascular hypertension, + CAD and +CHF  Normal cardiovascular exam Rhythm:Regular Rate:Normal  Echo 2020 global hypokinesis ef 45-50%   Neuro/Psych  Neuromuscular disease  negative psych ROS   GI/Hepatic negative GI ROS, Neg liver ROS,,,  Endo/Other  negative endocrine ROS    Renal/GU negative Renal ROS  negative genitourinary   Musculoskeletal  (+) Arthritis ,    Abdominal   Peds negative pediatric ROS (+)  Hematology  (+) Blood dyscrasia, anemia   Anesthesia Other Findings   Reproductive/Obstetrics negative OB ROS                              Anesthesia Physical Anesthesia Plan  ASA: 3  Anesthesia Plan: General   Post-op Pain Management:    Induction: Intravenous  PONV Risk Score and Plan: Propofol  infusion  Airway Management Planned: Nasal Cannula  Additional Equipment:   Intra-op Plan:   Post-operative Plan:   Informed Consent: I have reviewed the patients History and Physical, chart, labs and discussed the procedure including the risks, benefits and alternatives for the proposed anesthesia with the patient or authorized representative who has indicated his/her understanding and acceptance.     Dental advisory given  Plan Discussed with: CRNA  Anesthesia Plan Comments:         Anesthesia Quick Evaluation

## 2023-11-24 NOTE — Transfer of Care (Signed)
 Immediate Anesthesia Transfer of Care Note  Patient: [name]  Procedure(s) Performed: COLONOSCOPY  Patient Location: Endoscopy Unit  Anesthesia Type:General  Level of Consciousness: awake, alert , oriented, and patient cooperative  Airway & Oxygen Therapy: Patient Spontanous Breathing  Post-op Assessment: Report given to RN, Post -op Vital signs reviewed and stable, and Patient moving all extremities X 4  Post vital signs: Reviewed and stable  Last Vitals:  Vitals Value Taken Time  BP 102/42 11/24/23 08:04  Temp 36.5 C 11/24/23 08:01  Pulse 55 11/24/23 08:04  Resp 13 11/24/23 08:01  SpO2 95 % 11/24/23 08:04    Last Pain:  Vitals:   11/24/23 0801  TempSrc: Axillary  PainSc: 0-No pain         Complications: No notable events documented.

## 2023-11-24 NOTE — Anesthesia Postprocedure Evaluation (Signed)
 Anesthesia Post Note  Patient: [name]  Procedure(s) Performed: COLONOSCOPY  Patient location during evaluation: PACU Anesthesia Type: General Level of consciousness: awake and alert Pain management: pain level controlled Vital Signs Assessment: post-procedure vital signs reviewed and stable Respiratory status: spontaneous breathing, nonlabored ventilation, respiratory function stable and patient connected to nasal cannula oxygen Cardiovascular status: stable and blood pressure returned to baseline Postop Assessment: no apparent nausea or vomiting Anesthetic complications: no   No notable events documented.   Last Vitals:  Vitals:   11/24/23 0804 11/24/23 0807  BP: (!) 102/42 (!) 105/38  Pulse: (!) 55   Resp:  19  Temp:    SpO2: 95% 94%    Last Pain:  Vitals:   11/24/23 0801  TempSrc: Axillary  PainSc: 0-No pain                 Andrea Limes

## 2023-11-24 NOTE — Op Note (Signed)
 [facility] Patient Name: [name] Procedure Date: 11/24/2023 7:03 AM MRN: [id number] Date of Birth: 05/04/1951 Attending MD: Lamar Ozell Hollingshead , MD, [id number] CSN: [id number] Age: 73 Admit Type: Outpatient Procedure:                Colonoscopy Indications:               Providers:                Lamar Ozell Hollingshead, MD, Crystal Page, Bascom Blush Referring MD:             Lamar Ozell Hollingshead, MD Medicines:                Propofol  per Anesthesia Complications:            No immediate complications. Estimated Blood Loss:     Estimated blood loss was minimal. Procedure:                Pre-Anesthesia Assessment:                           - Prior to the procedure, a History and Physical                            was performed, and patient medications and                            allergies were reviewed. The patient's tolerance of                            previous anesthesia was also reviewed. The risks                            and benefits of the procedure and the sedation                            options and risks were discussed with the patient.                            All questions were answered, and informed consent                            was obtained. Prior Anticoagulants: The patient                            last took Xarelto  (rivaroxaban ) 2 days prior to the                            procedure. ASA Grade Assessment: III - A patient                            with severe systemic disease. After reviewing the                            risks and benefits, the patient was deemed in  satisfactory condition to undergo the procedure.                           After obtaining informed consent, the colonoscope                            was passed under direct vision. Throughout the                            procedure, the patient's blood pressure, pulse, and                            oxygen saturations were monitored  continuously. The                            [id number]) scope was introduced through the                            anus and advanced to the the cecum, identified by                            appendiceal orifice and ileocecal valve. The                            colonoscopy was performed without difficulty. The                            patient tolerated the procedure well. The quality                            of the bowel preparation was adequate. The                            ileocecal valve, appendiceal orifice, and rectum                            were photographed. The colonoscopy was performed                            without difficulty. The patient tolerated the                            procedure well. The quality of the bowel                            preparation was adequate. Scope In: 7:40:57 AM Scope Out: 7:56:42 AM Scope Withdrawal Time: 0 hours 10 minutes 46 seconds  Total Procedure Duration: 0 hours 15 minutes 45 seconds  Findings:      The perianal and digital rectal examinations were normal.      Two sessile polyps were found in the rectum, distal rectum, ascending       colon and mid ascending colon. The polyps were 4 to 5 mm in size. These       polyps were removed with a cold snare. Resection and  retrieval were       complete. Estimated blood loss was minimal.      A few medium-mouthed diverticula were found in the sigmoid colon.      The exam was otherwise without abnormality on direct and retroflexion       views. Impression:               - Two 4 to 5 mm polyps in the rectum, in the distal                            rectum, in the ascending colon and in the mid                            ascending colon, removed with a cold snare.                            Resected and retrieved.                           - Diverticulosis in the sigmoid colon.                           - The examination was otherwise normal on direct                             and retroflexion views. Moderate Sedation:      Moderate (conscious) sedation was personally administered by an       anesthesia professional. The following parameters were monitored: oxygen       saturation, heart rate, blood pressure, respiratory rate, EKG, adequacy       of pulmonary ventilation, and response to care. Recommendation:           - Patient has a contact number available for                            emergencies. The signs and symptoms of potential                            delayed complications were discussed with the                            patient. Return to normal activities tomorrow.                            Written discharge instructions were provided to the                            patient.                           - Advance diet as tolerated.                           - Continue present medications. Resume Xarelto   tomorrow.                           - Repeat colonoscopy date to be determined after                            pending pathology results are reviewed for                            surveillance.                           - Return to GI office (date not yet determined). Procedure Code(s):        --- Professional ---                           [phone], Colonoscopy, flexible; with removal of                            tumor(s), polyp(s), or other lesion(s) by snare                            technique Diagnosis Code(s):        --- Professional ---                           D12.8, Benign neoplasm of rectum                           D12.2, Benign neoplasm of ascending colon                           K57.30, Diverticulosis of large intestine without                            perforation or abscess without bleeding CPT copyright 2022 American [employer] Association. All rights reserved. The codes documented in this report are preliminary and upon coder review may  be revised to meet current compliance requirements. Lamar HERO.  Frank Solana, MD Lamar Ozell Hollingshead, MD 11/24/2023 8:52:42 AM This report has been signed electronically. Number of Addenda: 0

## 2023-11-24 NOTE — H&P (Signed)
 @LOGO @   Primary Care Physician:  Rosamond Leta NOVAK, MD Primary Gastroenterologist:  Dr. Shaaron  Pre-Procedure History & Physical: HPI:  [name] is a 73 y.o. male here for surveillance colonoscopy.  TV adenoma removed 3 years ago.  Past Medical History:  Diagnosis Date   Arthritis    knuckles maybe (02/28/2018)   Atrial fib/flutter, transient (HCC)    a. s/p TEE-guided DCCV on 12/14/2017 with return to NSR.    BPH (benign prostatic hyperplasia) 12/11/2017   CHF (congestive heart failure) (HCC)    Echo in November 2020 with EF 45-50% mild LVH, global hypokinesis.   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   Pneumonia 11/2017   Thyroid  nodule    Benign    Past Surgical History:  Procedure Laterality Date   ATRIAL FIBRILLATION ABLATION N/A 04/04/2019   Procedure: ATRIAL FIBRILLATION ABLATION;  Surgeon: Inocencio Soyla Lunger, MD;  Location: [facility] INVASIVE CV LAB;  Service: Cardiovascular;  Laterality: N/A;   BACK SURGERY     BIOPSY THYROID      CARDIOVERSION N/A 12/14/2017   Procedure: CARDIOVERSION;  Surgeon: Debera Jayson MATSU, MD;  Location: [facility] ORS;  Service: Cardiovascular;  Laterality: N/A;   CARDIOVERSION N/A 01/14/2019   Procedure: CARDIOVERSION;  Surgeon: Inocencio Soyla Lunger, MD;  Location: [facility] ENDOSCOPY;  Service: Cardiovascular;  Laterality: N/A;   CATARACT EXTRACTION W/ INTRAOCULAR LENS  IMPLANT, BILATERAL Bilateral 2006-2010   right-left   COLONOSCOPY  12/30/2010   [facility]; Dr. DeMason; normal colonoscopy.  Repeat in 10 years.   COLONOSCOPY WITH PROPOFOL  N/A 08/01/2020   Procedure: COLONOSCOPY WITH PROPOFOL ;  Surgeon: Shaaron Lamar HERO, MD;  Location: [facility] ENDO SUITE;  Service: Endoscopy;  Laterality: N/A;  AM-pt requests as early as possible   IRRIGATION AND DEBRIDEMENT SEBACEOUS CYST     LUMBAR LAMINECTOMY N/A 01/20/2013   Procedure:  MICRODISCECTOMY LUMBAR LAMINECTOMY;  Surgeon: Oneil JAYSON Herald, MD;  Location: [facility] OR;  Service: Orthopedics;  Laterality: N/A;  L4-5 Decompression   PILONIDAL CYST DRAINAGE     POLYPECTOMY  08/01/2020   Procedure: POLYPECTOMY INTESTINAL;  Surgeon: Shaaron Lamar HERO, MD;  Location: [facility] ENDO SUITE;  Service: Endoscopy;;  splenic flexure colon polyp;    TEE WITHOUT CARDIOVERSION N/A 12/14/2017   Procedure: TRANSESOPHAGEAL ECHOCARDIOGRAM (TEE) WITH PROPOFOL ;  Surgeon: Debera Jayson MATSU, MD;  Location: [facility] ORS;  Service: Cardiovascular;  Laterality: N/A;    Prior to Admission medications   Medication Sig Start Date End Date Taking? Authorizing Provider  allopurinol  (ZYLOPRIM ) 300 MG tablet Take 300 mg by mouth daily.    Yes [provider]  amLODipine  (NORVASC ) 5 MG tablet TAKE ONE TABLET BY MOUTH DAILY 01/10/20  Yes Camnitz, Will Lunger, MD  buPROPion (WELLBUTRIN XL) 150 MG 24 hr tablet Take 150 mg by mouth daily.   Yes [provider]  Calcium Carb-Cholecalciferol  (CALCIUM + D3 PO) Take 1 tablet by mouth daily at 3 pm.    Yes [provider]  carvedilol  (COREG ) 25 MG tablet TAKE ONE TABLET BY MOUTH TWICE DAILY. TAKE WITH A MEAL. Please make yearly appt with Dr. Inocencio for February 2022 for future refills. Thank you 1st attempt 05/01/20  Yes Camnitz, Will Lunger, MD  CINNAMON PO Take 350 mg by mouth daily at 3 pm.    Yes [provider]  Coenzyme Q10 200 MG  capsule Take 400 mg by mouth daily in the afternoon.    Yes [provider]  dofetilide  (TIKOSYN ) 250 MCG capsule TAKE 1 CAPSULE(250 MCG) BY MOUTH TWICE DAILY 10/06/23  Yes Camnitz, Will Gladis, MD  furosemide  (LASIX ) 40 MG tablet Take 40 mg by mouth daily as needed. 05/17/23  Yes [provider]  Glucosamine-Chondroitin (GLUCOSAMINE CHONDR COMPLEX PO) Take 1 tablet by mouth daily.    Yes [provider]  LUTEIN-ZEAXANTHIN PO Take 2 tablets by mouth daily in the afternoon.    Yes [provider]  Multiple Vitamins-Minerals (MULTIVITAMIN PO) Take 1 tablet by mouth daily at 3 pm.    Yes [provider]  olmesartan  (BENICAR ) 40 MG tablet Take 1 tablet (40 mg total) by mouth daily. 05/01/20  Yes Richarda Prentice LITTIE Mickey., NP  Omega-3 Fatty Acids (OMEGA 3 PO) Take 1,350 mg by mouth daily.   Yes [provider]  PHOSPHATIDYLCHOLINE PO Take 2 tablets by mouth daily in the afternoon.    Yes [provider]  potassium chloride  (KLOR-CON ) 10 MEQ tablet Take 10 mEq by mouth daily as needed. 05/17/23  Yes [provider]  Probiotic Product (PROBIOTIC DAILY PO) Take 1 tablet by mouth daily in the afternoon.   Yes [provider]  RESVERATROL 100 MG CAPS Take 100 mg by mouth daily at 3 pm.    Yes [provider]  tamsulosin  (FLOMAX ) 0.4 MG CAPS capsule Take 0.4 mg by mouth daily.  08/06/15  Yes [provider]  Ferrous Gluconate-C-Folic Acid (IRON-C PO) Take 1 tablet by mouth daily.    [provider]  rivaroxaban  (XARELTO ) 20 MG TABS tablet Take 1 tablet (20 mg total) by mouth daily with supper. 09/16/21   Camnitz, Soyla Gladis, MD    Allergies as of 10/06/2023 - Review Complete 09/20/2023  Allergen Reaction Noted   Antihistamines, diphenhydramine-type  07/19/2022   Azithromycin  06/29/2018    Family History  Problem Relation Age of Onset   Lung cancer Father    Cancer - Lung Father    Atrial fibrillation Mother    Dementia Mother    Transient ischemic attack Mother    Colon polyps Mother    Hypertension Other    Liver cancer Brother    Colon cancer Maternal Uncle        diagnosed in his late 35s or early 36s    Social History   Socioeconomic History   Marital status: Married    Spouse name: Not on file   Number of children: 3   Years of education: Not on file   Highest education level: Not on file  Occupational History   Not on file  Tobacco Use   Smoking status: Never   Smokeless tobacco: Never   Vaping Use   Vaping status: Never Used  Substance and Sexual Activity   Alcohol  use: Never    Alcohol /week: 0.0 standard drinks of alcohol    Drug use: Never   Sexual activity: Not Currently  Other Topics Concern   Not on file  Social History Narrative   Not on file   Social Drivers of Health   Financial Resource Strain: Not on file  Food Insecurity: Not on file  Transportation Needs: Not on file  Physical Activity: Not on file  Stress: Not on file  Social Connections: Not on file  Intimate Partner Violence: Not on file    Review of Systems: See HPI, otherwise negative ROS  Physical Exam: BP 125/62 (  BP Location: Right Arm)   Temp 98 F (36.7 C)   Resp 16   Ht 6' 2 (1.88 m)   Wt 120.2 kg   SpO2 98%   BMI 34.02 kg/m  General:   Alert,  Well-developed, well-nourished, pleasant and cooperative in NAD Lungs:  Clear throughout to auscultation.   No wheezes, crackles, or rhonchi. No acute distress. Heart:  Regular rate and rhythm; no murmurs, clicks, rubs,  or gallops. Abdomen: Non-distended, normal bowel sounds.  Soft and nontender without appreciable mass or hepatosplenomegaly.  Pulses:  Normal pulses noted. Extremities:  Without clubbing or edema.  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  Here for surveillance.  Xarelto  held per plan. The risks, benefits, limitations, alternatives and imponderables have been reviewed with the patient. Questions have been answered. All parties are agreeable.       Notice: This dictation was prepared with Dragon dictation along with smaller phrase technology. Any transcriptional errors that result from this process are unintentional and may not be corrected upon review.

## 2023-11-24 NOTE — Discharge Instructions (Signed)
  Colonoscopy Discharge Instructions  Read the instructions outlined below and refer to this sheet in the next few weeks. These discharge instructions provide you with general information on caring for yourself after you leave the hospital. Your doctor may also give you specific instructions. While your treatment has been planned according to the most current medical practices available, unavoidable complications occasionally occur. If you have any problems or questions after discharge, call Dr. Shaaron at [phone]. ACTIVITY You may resume your regular activity, but move at a slower pace for the next 24 hours.  Take frequent rest periods for the next 24 hours.  Walking will help get rid of the air and reduce the bloated feeling in your belly (abdomen).  No driving for 24 hours (because of the medicine (anesthesia) used during the test).   Do not sign any important legal documents or operate any machinery for 24 hours (because of the anesthesia used during the test).  NUTRITION Drink plenty of fluids.  You may resume your normal diet as instructed by your doctor.  Begin with a light meal and progress to your normal diet. Heavy or fried foods are harder to digest and may make you feel sick to your stomach (nauseated).  Avoid alcoholic beverages for 24 hours or as instructed.  MEDICATIONS You may resume your normal medications unless your doctor tells you otherwise.  WHAT YOU CAN EXPECT TODAY Some feelings of bloating in the abdomen.  Passage of more gas than usual.  Spotting of blood in your stool or on the toilet paper.  IF YOU HAD POLYPS REMOVED DURING THE COLONOSCOPY: No aspirin products for 7 days or as instructed.  No alcohol  for 7 days or as instructed.  Eat a soft diet for the next 24 hours.  FINDING OUT THE RESULTS OF YOUR TEST Not all test results are available during your visit. If your test results are not back during the visit, make an appointment with your caregiver to find out the  results. Do not assume everything is normal if you have not heard from your caregiver or the medical facility. It is important for you to follow up on all of your test results.  SEEK IMMEDIATE MEDICAL ATTENTION IF: You have more than a spotting of blood in your stool.  Your belly is swollen (abdominal distention).  You are nauseated or vomiting.  You have a temperature over 101.  You have abdominal pain or discomfort that is severe or gets worse throughout the day.     2 small polyps found and removed today  Diverticulosis present.  Further recommendations to follow pending review of pathology report  Resume Xarelto  tomorrow  At patient request, I called Helga Gillaspie at [phone] findings and recommendations

## 2023-11-25 ENCOUNTER — Encounter (HOSPITAL_COMMUNITY): Payer: Self-pay | Admitting: Internal Medicine

## 2023-11-25 ENCOUNTER — Ambulatory Visit: Payer: Self-pay | Admitting: Internal Medicine

## 2023-11-25 LAB — SURGICAL PATHOLOGY

## 2023-11-25 NOTE — Progress Notes (Signed)
 Left voice message.

## 2024-02-14 NOTE — Progress Notes (Unsigned)
  Electrophysiology Office Note:   Date:  02/17/2024  ID:  [name], DOB June 27, 1950, MRN [id number]  Primary Cardiologist: None Primary Heart Failure: None Electrophysiologist: Add Dinapoli Gladis Norton, MD      History of Present Illness:   [name] is a 73 y.o. male with h/o atrial fibrillation/flutter, hypertension seen today for routine electrophysiology followup.   Since last being seen in our clinic the patient reports doing well.  He has no chest pain or shortness of breath.  He did have 1 episode of atrial fibrillation for 2 to 3 hours over the weekend.  He was fatigued during the episode.  He has not had an episode prior to that for many months.  He is happy with his control.  he denies chest pain, palpitations, dyspnea, PND, orthopnea, nausea, vomiting, dizziness, syncope, edema, weight gain, or early satiety.   Review of systems complete and found to be negative unless listed in HPI.   EP Information / Studies Reviewed:    EKG is ordered today. Personal review as below.  EKG Interpretation Date/Time:  Thursday February 17 2024 09:19:00 EDT Ventricular Rate:  60 PR Interval:  194 QRS Duration:  104 QT Interval:  434 QTC Calculation: 434 R Axis:   3  Text Interpretation: Normal sinus rhythm Normal ECG When compared with ECG of 22-Nov-2023 10:47, Nonspecific ST abnormality is no longer Confirmed by [name] ([id number]) on 02/17/2024 9:30:39 AM     Risk Assessment/Calculations:    CHA2DS2-VASc Score = 4   This indicates a 4.8% annual risk of stroke. The patient's score is based upon: CHF History: 1 HTN History: 1 Diabetes History: 0 Stroke History: 0 Vascular Disease History: 1 Age Score: 1 Gender Score: 0            Physical Exam:   VS:  BP 128/70 (BP Location: Right Arm, Patient Position: Sitting, Cuff Size: Large)   Pulse 60   Ht 6' 2 (1.88 m)   Wt 264 lb (119.7 kg)   SpO2 98%   BMI 33.90 kg/m    Wt Readings from Last 3 Encounters:   02/17/24 264 lb (119.7 kg)  11/24/23 264 lb 15.9 oz (120.2 kg)  11/22/23 265 lb (120.2 kg)     GEN: Well nourished, well developed in no acute distress NECK: No JVD; No carotid bruits CARDIAC: Regular rate and rhythm, no murmurs, rubs, gallops RESPIRATORY:  Clear to auscultation without rales, wheezing or rhonchi  ABDOMEN: Soft, non-tender, non-distended EXTREMITIES:  No edema; No deformity   ASSESSMENT AND PLAN:    1.  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  Post ablation 04/04/2019 complicated by phrenic nerve injury.  He had 1 episode of atrial fibrillation over the weekend, but otherwise has been doing well without complaint.  He is happy with his control.  [name] continue with current management.  2.  High-risk medication monitoring: On dofetilide .  QTc remained stable.  [name] check BMP and magnesium today.  3.  Secondary hypercoagulable state: Continue Xarelto   4.  Hypertension: Well-controlled  Follow up with [facility] in 6 months  Signed, Mickeal Daws Gladis Norton, MD

## 2024-02-17 ENCOUNTER — Other Ambulatory Visit: Payer: Self-pay

## 2024-02-17 ENCOUNTER — Encounter: Payer: Self-pay | Admitting: Cardiology

## 2024-02-17 ENCOUNTER — Ambulatory Visit: Attending: Cardiology | Admitting: Cardiology

## 2024-02-17 VITALS — BP 128/70 | HR 60 | Ht 74.0 in | Wt 264.0 lb

## 2024-02-17 DIAGNOSIS — I10 Essential (primary) hypertension: Secondary | ICD-10-CM | POA: Diagnosis not present

## 2024-02-17 DIAGNOSIS — Z5181 Encounter for therapeutic drug level monitoring: Secondary | ICD-10-CM

## 2024-02-17 DIAGNOSIS — Z79899 Other long term (current) drug therapy: Secondary | ICD-10-CM | POA: Diagnosis not present

## 2024-02-17 DIAGNOSIS — I4819 Other persistent atrial fibrillation: Secondary | ICD-10-CM

## 2024-02-17 DIAGNOSIS — D6869 Other thrombophilia: Secondary | ICD-10-CM | POA: Diagnosis not present

## 2024-02-17 NOTE — Patient Instructions (Signed)
 Medication Instructions:  Your physician recommends that you continue on your current medications as directed. Please refer to the Current Medication list given to you today.  *If you need a refill on your cardiac medications before your next appointment, please call your pharmacy*  Lab Work: TODAY: BMET and Mag  Follow-Up: At [facility], you and your health needs are our priority.  As part of our continuing mission to provide you with exceptional heart care, our providers are all part of one team.  This team includes your primary Cardiologist (physician) and Advanced Practice Providers or APPs (Physician Assistants and Nurse Practitioners) who all work together to provide you with the care you need, when you need it.  Your next appointment:   6 months  Provider:   [facility] clinic

## 2024-02-18 ENCOUNTER — Ambulatory Visit: Payer: Self-pay

## 2024-02-18 LAB — BASIC METABOLIC PANEL WITH GFR
BUN/Creatinine Ratio: 20 (ref 10–24)
BUN: 19 mg/dL (ref 8–27)
CO2: 22 mmol/L (ref 20–29)
Calcium: 8.8 mg/dL (ref 8.6–10.2)
Chloride: 106 mmol/L (ref 96–106)
Creatinine, Ser: 0.93 mg/dL (ref 0.76–1.27)
Glucose: 108 mg/dL — ABNORMAL HIGH (ref 70–99)
Potassium: 4.1 mmol/L (ref 3.5–5.2)
Sodium: 143 mmol/L (ref 134–144)
eGFR: 86 mL/min/1.73 (ref >59)

## 2024-02-18 LAB — MAGNESIUM: Magnesium: 2.2 mg/dL (ref 1.6–2.3)

## 2024-03-16 DIAGNOSIS — Z79899 Other long term (current) drug therapy: Secondary | ICD-10-CM | POA: Diagnosis not present

## 2024-03-16 DIAGNOSIS — R5383 Other fatigue: Secondary | ICD-10-CM | POA: Diagnosis not present

## 2024-03-16 DIAGNOSIS — E7800 Pure hypercholesterolemia, unspecified: Secondary | ICD-10-CM | POA: Diagnosis not present

## 2024-04-05 ENCOUNTER — Other Ambulatory Visit: Payer: Self-pay | Admitting: Cardiology

## 2024-08-15 ENCOUNTER — Ambulatory Visit (HOSPITAL_COMMUNITY): Admitting: Internal Medicine
